# Patient Record
Sex: Male | Born: 1970
Health system: Southern US, Community
[De-identification: ages and names within clinical notes are randomized; demographics above are authoritative.]

## PROBLEM LIST (undated history)

## (undated) DIAGNOSIS — F329 Major depressive disorder, single episode, unspecified: Secondary | ICD-10-CM

## (undated) DIAGNOSIS — T7840XA Allergy, unspecified, initial encounter: Secondary | ICD-10-CM

## (undated) DIAGNOSIS — K219 Gastro-esophageal reflux disease without esophagitis: Secondary | ICD-10-CM

## (undated) DIAGNOSIS — R1310 Dysphagia, unspecified: Secondary | ICD-10-CM

## (undated) DIAGNOSIS — K589 Irritable bowel syndrome without diarrhea: Secondary | ICD-10-CM

## (undated) DIAGNOSIS — F32A Depression, unspecified: Secondary | ICD-10-CM

## (undated) DIAGNOSIS — K200 Eosinophilic esophagitis: Secondary | ICD-10-CM

## (undated) DIAGNOSIS — Z8619 Personal history of other infectious and parasitic diseases: Secondary | ICD-10-CM

## (undated) DIAGNOSIS — F419 Anxiety disorder, unspecified: Secondary | ICD-10-CM

## (undated) HISTORY — PX: OTHER SURGICAL HISTORY: SHX169

## (undated) HISTORY — PX: UPPER GASTROINTESTINAL ENDOSCOPY: SHX188

## (undated) HISTORY — DX: Depression, unspecified: F32.A

## (undated) HISTORY — DX: Irritable bowel syndrome, unspecified: K58.9

## (undated) HISTORY — DX: Dysphagia, unspecified: R13.10

## (undated) HISTORY — DX: Gastro-esophageal reflux disease without esophagitis: K21.9

## (undated) HISTORY — DX: Anxiety disorder, unspecified: F41.9

## (undated) HISTORY — DX: Personal history of other infectious and parasitic diseases: Z86.19

## (undated) HISTORY — PX: EYE SURGERY: SHX253

## (undated) HISTORY — DX: Eosinophilic esophagitis: K20.0

## (undated) HISTORY — DX: Allergy, unspecified, initial encounter: T78.40XA

---
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
## 1999-06-06 ENCOUNTER — Other Ambulatory Visit: Admission: RE | Admit: 1999-06-06 | Discharge: 1999-06-06 | Payer: Self-pay | Admitting: Internal Medicine

## 2002-09-29 ENCOUNTER — Inpatient Hospital Stay (HOSPITAL_COMMUNITY): Admission: EM | Admit: 2002-09-29 | Discharge: 2002-10-03 | Payer: Self-pay | Admitting: Internal Medicine

## 2002-10-14 ENCOUNTER — Encounter: Admission: RE | Admit: 2002-10-14 | Discharge: 2002-10-14 | Payer: Self-pay | Admitting: Internal Medicine

## 2002-12-22 ENCOUNTER — Encounter: Admission: RE | Admit: 2002-12-22 | Discharge: 2002-12-22 | Payer: Self-pay | Admitting: Internal Medicine

## 2010-12-11 ENCOUNTER — Encounter: Payer: Self-pay | Admitting: Internal Medicine

## 2010-12-11 ENCOUNTER — Ambulatory Visit (INDEPENDENT_AMBULATORY_CARE_PROVIDER_SITE_OTHER): Payer: 59 | Admitting: Internal Medicine

## 2010-12-11 DIAGNOSIS — R1310 Dysphagia, unspecified: Secondary | ICD-10-CM

## 2010-12-11 DIAGNOSIS — R079 Chest pain, unspecified: Secondary | ICD-10-CM

## 2010-12-11 NOTE — Progress Notes (Signed)
SEMISI Best [birth year] MRN [id number]        History of Present Illness:  This is a 40 year old white male with episode of severe substernal discomfort associated with eating an the evening meal consisting of chicken and pasta. According to him the food got stuck  for about 2 hours while he was trying to regurgitate it and belch. Small pieces of food came back but most of it eventually passed through.. Had a previous episode not as severe several years ago in [place], where his wife was doing a Financial risk analyst.. Barium swallow at that time did not show any abnormality such as stricture or dysmotility. although patient was not given a barium tablet .He reports occasional difficulty with solid food dysphagia and occasional heartburn. Since the episode 5 days ago he's had some odynophagia which has been improving . He denies dysphagia to liquids. Since the episode started he has been on ranitidine 75  milligrams daily, we have seeing him at approximately 10 years ago for symptoms associated with irritable bowel syndrome. He actually had an upper endoscopy at that time which was essentially normal, small bowel biopsies did not show evidence of  celiac disease  Past Medical History  Diagnosis Date  . Irritable bowel syndrome   . Elevated LFTs   . GERD (gastroesophageal reflux disease)   . History of shingles    History reviewed. No pertinent past surgical history.  reports that he has quit smoking. He has never used smokeless tobacco. He reports that he drinks alcohol. He reports that he does not use illicit drugs. family history includes Colon cancer in his paternal grandmother; Depression in his father; Diabetes in his mother; and Heart disease in his father. No Known Allergies      Review of Systems:  The remainder of the 10  point ROS is negative except as outlined in H&P   Physical Exam: General appearance  Well developed in no distress Eyes- non icteric HEENT nontraumatic,  normocephalic Mouth no lesions, tongue papillated, no cheilosis Neck supple without adenopathy, thyroid not enlarged, no carotid bruits, no JVD Lungs Clear to auscultation bilaterally Cor normal S1 normal S2, regular rhythm , no murmur,  quiet precordium Abdomen soft, voluntary guarding. Normoactive bowel sounds. Minimal discomfort in the subxiphoid area in the midline. Lower abdomen unremarkable Rectal: Deferred Extremities no pedal edema Skin no lesions Neurological alert and oriented x3 Psychological normal mood and  affect  Assessment and Plan:  Recurrent episodes of solid food dysphagia and chest discomfort suggestive of esophageal obstruction or esophageal spasm. Although his upper GI series several years ago was normal we need to rule out achalasia, esophageal ring or web or esophageal motility disorder. He does not have the  typical symptoms of gastroesophageal reflux. The symptoms seem to be intermittent which would go with achalasia. There has been no systemic changes of weight  loss or failure to thrive. Eosinophilic esophagitis is a possibility we will proceed with a diagnostic upper endoscopy and possible dilatation depending on the findings. Until then I gave him samples for Prilosec to take 20 mg daily. Esophageal manometry or repeat barium esophagram is an option   12/11/2010 Lina Sar

## 2010-12-11 NOTE — Patient Instructions (Addendum)
You have been scheduled for an endoscopy. Please follow written instructions given to you at your visit today. Continue Prilosec samples 20 mg by mouth once daily. CC: Dr Albertine Patricia, Urgent Care, [address]

## 2010-12-16 ENCOUNTER — Encounter: Payer: Self-pay | Admitting: Internal Medicine

## 2011-01-09 ENCOUNTER — Ambulatory Visit (AMBULATORY_SURGERY_CENTER): Payer: 59 | Admitting: Internal Medicine

## 2011-01-09 ENCOUNTER — Encounter: Payer: Self-pay | Admitting: Internal Medicine

## 2011-01-09 VITALS — BP 121/81 | HR 61 | Temp 97.9°F | Resp 20 | Ht 71.0 in | Wt 178.0 lb

## 2011-01-09 DIAGNOSIS — K200 Eosinophilic esophagitis: Secondary | ICD-10-CM

## 2011-01-09 DIAGNOSIS — R1310 Dysphagia, unspecified: Secondary | ICD-10-CM

## 2011-01-09 DIAGNOSIS — R933 Abnormal findings on diagnostic imaging of other parts of digestive tract: Secondary | ICD-10-CM

## 2011-01-09 DIAGNOSIS — K222 Esophageal obstruction: Secondary | ICD-10-CM

## 2011-01-09 MED ORDER — SODIUM CHLORIDE 0.9 % IV SOLN: 500.0000 mL | INTRAVENOUS | Status: DC

## 2011-01-09 MED ORDER — ESOMEPRAZOLE MAGNESIUM 40 MG PO CPDR: 40.0000 mg | DELAYED_RELEASE_CAPSULE | Freq: Every day | ORAL | Status: DC

## 2011-01-09 NOTE — Patient Instructions (Signed)
FOLLOW DISCHARGE INSTRUCTIONS (BLUE & GREEN SHEETS).  FOLLOW DILATION OF ESOPHAGUS DIET GIVEN TO YOU . LIQUIDS UNTIL 10 AM , THEN SOFT FOODS THE REST OF TODAY.

## 2011-01-10 ENCOUNTER — Telehealth: Payer: Self-pay | Admitting: *Deleted

## 2011-01-10 NOTE — Telephone Encounter (Signed)

## 2011-01-15 ENCOUNTER — Encounter: Payer: Self-pay | Admitting: Internal Medicine

## 2011-04-11 ENCOUNTER — Encounter (INDEPENDENT_AMBULATORY_CARE_PROVIDER_SITE_OTHER): Payer: 59 | Admitting: Family Medicine

## 2011-04-11 DIAGNOSIS — R5383 Other fatigue: Secondary | ICD-10-CM

## 2011-04-11 DIAGNOSIS — Z0000 Encounter for general adult medical examination without abnormal findings: Secondary | ICD-10-CM

## 2011-04-11 DIAGNOSIS — R5381 Other malaise: Secondary | ICD-10-CM

## 2011-11-06 ENCOUNTER — Encounter: Payer: Self-pay | Admitting: Internal Medicine

## 2011-11-06 ENCOUNTER — Ambulatory Visit (INDEPENDENT_AMBULATORY_CARE_PROVIDER_SITE_OTHER): Payer: 59 | Admitting: Internal Medicine

## 2011-11-06 VITALS — BP 115/70 | HR 64 | Temp 97.6°F | Resp 16 | Ht 71.0 in | Wt 175.0 lb

## 2011-11-06 DIAGNOSIS — T700XXA Otitic barotrauma, initial encounter: Secondary | ICD-10-CM

## 2011-11-06 DIAGNOSIS — H9209 Otalgia, unspecified ear: Secondary | ICD-10-CM

## 2011-11-06 NOTE — Progress Notes (Signed)
  Subjective:    Patient ID: Darren Best, male    DOB: [birth year], 41 y.o.   MRN: [id number]  HPI Scuba diving, felt pressure right ear, never fully returned to normal. Now low grade pressure feeling, no hearing loss. No uri, ha, fever.   Review of Systems     Objective:   Physical Exam Right TM appears normal but does not move withpopping, left TM does move . No reddness, no wax       Assessment & Plan:  Pop ears, chew gum and give 1 week Refer to ent if not well

## 2012-04-26 ENCOUNTER — Encounter: Payer: Self-pay | Admitting: Family Medicine

## 2012-04-26 ENCOUNTER — Ambulatory Visit (INDEPENDENT_AMBULATORY_CARE_PROVIDER_SITE_OTHER): Payer: 59 | Admitting: Family Medicine

## 2012-04-26 VITALS — BP 118/80 | HR 60 | Temp 98.0°F | Resp 16 | Ht 71.0 in | Wt 181.2 lb

## 2012-04-26 DIAGNOSIS — Z23 Encounter for immunization: Secondary | ICD-10-CM

## 2012-04-26 DIAGNOSIS — G44209 Tension-type headache, unspecified, not intractable: Secondary | ICD-10-CM

## 2012-04-26 DIAGNOSIS — R079 Chest pain, unspecified: Secondary | ICD-10-CM

## 2012-04-26 DIAGNOSIS — F4541 Pain disorder exclusively related to psychological factors: Secondary | ICD-10-CM

## 2012-04-26 DIAGNOSIS — K589 Irritable bowel syndrome without diarrhea: Secondary | ICD-10-CM

## 2012-04-26 DIAGNOSIS — Z0000 Encounter for general adult medical examination without abnormal findings: Secondary | ICD-10-CM

## 2012-04-26 LAB — CBC WITH DIFFERENTIAL/PLATELET
Basophils Absolute: 0.1 K/uL (ref 0.0–0.1)
Basophils Relative: 1 % (ref 0–1)
Eosinophils Absolute: 0.5 K/uL (ref 0.0–0.7)
Eosinophils Relative: 10 % — ABNORMAL HIGH (ref 0–5)
HCT: 41.9 % (ref 39.0–52.0)
Hemoglobin: 14.7 g/dL (ref 13.0–17.0)
Lymphocytes Relative: 34 % (ref 12–46)
Lymphs Abs: 1.8 K/uL (ref 0.7–4.0)
MCH: 30.4 pg (ref 26.0–34.0)
MCHC: 35.1 g/dL (ref 30.0–36.0)
MCV: 86.6 fL (ref 78.0–100.0)
Monocytes Absolute: 0.4 K/uL (ref 0.1–1.0)
Monocytes Relative: 8 % (ref 3–12)
Neutro Abs: 2.6 K/uL (ref 1.7–7.7)
Neutrophils Relative %: 47 % (ref 43–77)
Platelets: 279 K/uL (ref 150–400)
RBC: 4.84 MIL/uL (ref 4.22–5.81)
RDW: 13.8 % (ref 11.5–15.5)
WBC: 5.4 K/uL (ref 4.0–10.5)

## 2012-04-26 LAB — COMPREHENSIVE METABOLIC PANEL WITH GFR
ALT: 14 U/L (ref 0–53)
AST: 19 U/L (ref 0–37)
Albumin: 4.5 g/dL (ref 3.5–5.2)
Alkaline Phosphatase: 53 U/L (ref 39–117)
BUN: 10 mg/dL (ref 6–23)
CO2: 28 meq/L (ref 19–32)
Calcium: 9.6 mg/dL (ref 8.4–10.5)
Chloride: 104 meq/L (ref 96–112)
Creat: 0.83 mg/dL (ref 0.50–1.35)
Glucose, Bld: 93 mg/dL (ref 70–99)
Potassium: 4.3 meq/L (ref 3.5–5.3)
Sodium: 140 meq/L (ref 135–145)
Total Bilirubin: 0.6 mg/dL (ref 0.3–1.2)
Total Protein: 7.3 g/dL (ref 6.0–8.3)

## 2012-04-26 LAB — LIPID PANEL
Cholesterol: 222 mg/dL — ABNORMAL HIGH (ref 0–200)
HDL: 62 mg/dL
LDL Cholesterol: 149 mg/dL — ABNORMAL HIGH (ref 0–99)
Total CHOL/HDL Ratio: 3.6 ratio
Triglycerides: 53 mg/dL
VLDL: 11 mg/dL (ref 0–40)

## 2012-04-26 MED ORDER — HYOSCYAMINE SULFATE 0.125 MG PO TABS: 0.1250 mg | ORAL_TABLET | ORAL | Status: DC | PRN

## 2012-04-26 NOTE — Progress Notes (Signed)
Subjective:    Patient ID: Darren Best, male    DOB: 05/04/1970, 42 y.o.   MRN: [id number]  HPI Darren Best is a 42 y.o. male Here for CPE.  Last CPE - 04/2011. LDL elevated then at 143- recommended 6 months follow up.   Chest pains - few times over the holidays - no nausea, no dizziness, not sweating at the time.  Non exertional, came out of the blue. No chest pain with exercise.  Would last for about a day - center chest - "after pain of feeling like somebody hit him". Hx of GERD in past, followed by GI - taking Nexium, but not every day - taking about 4 -5 days per week. Hx of endoscopy with "scarring" and had to have widening of esophagus in 01/2011.  Some increased stress with work, but not particularly anxious at times of pain. No radiation of pain except upper back once.  dull ache. Thought may be sleep posture. No personal hx of heart disease. Tried low dose aspirin x 4 - pain relieved in 1 day. headache at times - stress headaches at times. Occipital at times. Less exercise recently. 2-3 days per week.   Rare - less than once per month IBS sx's - treated with hyoscamine in past - ran out.   Unknown last tetanus.  Last flu vaccine in November.   FH: father with stent recently - in 68's.  No early CAD/MI in family.  Mom and brother with DM2 Quit smoking 8-10 years ago. approx 5 pack year hx or less.    Review of Systems  Respiratory: Negative for cough, chest tightness and shortness of breath.   Cardiovascular: Positive for chest pain.  Neurological: Positive for headaches.  All other systems reviewed and are negative.       Objective:   Physical Exam  Vitals reviewed. Constitutional: He is oriented to person, place, and time. He appears well-developed and well-nourished.  HENT:  Head: Normocephalic and atraumatic.  Right Ear: External ear normal.  Left Ear: External ear normal.  Mouth/Throat: Oropharynx is clear and moist.  Eyes: Conjunctivae normal and EOM are  normal. Pupils are equal, round, and reactive to light.  Neck: Normal range of motion. Neck supple. No thyromegaly present.  Cardiovascular: Normal rate, regular rhythm, normal heart sounds and intact distal pulses.   Pulmonary/Chest: Effort normal and breath sounds normal. No respiratory distress. He has no wheezes.  Abdominal: Soft. He exhibits no distension. There is no tenderness. Hernia confirmed negative in the right inguinal area and confirmed negative in the left inguinal area.  Musculoskeletal: Normal range of motion. He exhibits no edema and no tenderness.  Lymphadenopathy:    He has no cervical adenopathy.  Neurological: He is alert and oriented to person, place, and time. He has normal reflexes.  Skin: Skin is warm and dry.  Psychiatric: He has a normal mood and affect. His behavior is normal.    EKG: Sinus rhythm, rate 55, nonspecific t in V2 only.       Assessment & Plan:  Darren Best is a 42 y.o. male 1. Routine general medical examination at a health care facility  CBC with Differential, Comprehensive metabolic panel, TSH, EKG 12-Lead. Labs drawn, anticipatory guidance as below. Declined STI testing.  tdap updated.  utd on flu vaccination.   2. Chest pain  Comprehensive metabolic panel, Lipid panel, EKG 12-Lead. Non exertional and no pain with exercise reassuring.  Hx of GERD, with occasional missed doses  of PPI. Only cad risk factor - prior hyperlipidemia.  PPI QD, and if pain persists - consider stress testing/cards eval or recheck with GI.   3. Stress headache  Tylenol, relaxation and stress mgt techniques. Recheck if persistent or worsening.   4. IBS (irritable bowel syndrome)  hyoscyamine (LEVSIN, ANASPAZ) 0.125 MG tablet refilled as rare flair, but if refills neede, may need to discuss with GI.   5. Need for prophylactic vaccination with combined diphtheria-tetanus-pertussis (DTP) vaccine  Tdap vaccine greater than or equal to 7yo IM    Patient Instructions  Try to  take your acid blocker every day.  If chest pains recur - would recommend stress testing or cardiology evaluation, and possibly may be something to discuss with your gastroenterologist. Return to the clinic or go to the nearest emergency room if any of your symptoms worsen or new symptoms occur.  Your should receive a call or letter about your lab results within the next week to 10 days.  If cholesterol is elevated, we can discuss diet and medication options. To look up more info on your condition, go to the website [email or web address], then on patient resources - select UPTODATE. Under patient resources, select cholesterol or hyperlipidemia.  Keeping you healthy  Get these tests  Blood pressure- Have your blood pressure checked once a year by your healthcare provider.  Normal blood pressure is 120/80.  Weight- Have your body mass index (BMI) calculated to screen for obesity.  BMI is a measure of body fat based on height and weight. You can also calculate your own BMI at https://www.west-esparza.com/.  Cholesterol- Have your cholesterol checked regularly starting at age 20, sooner may be necessary if you have diabetes, high blood pressure, if a family member developed heart diseases at an early age or if you smoke.   Chlamydia, HIV, and other sexual transmitted disease- Get screened each year until the age of 15 then within three months of each new sexual partner.  Diabetes- Have your blood sugar checked regularly if you have high blood pressure, high cholesterol, a family history of diabetes or if you are overweight.  Get these vaccines  Flu shot- Every fall.  Tetanus shot- Every 10 years.  Menactra- Single dose; prevents meningitis.  Take these steps  Don't smoke- If you do smoke, ask your healthcare provider about quitting. For tips on how to quit, go to www.smokefree.gov or call 1-800-QUIT-NOW.  Be physically active- Exercise 5 days a week for at least 30 minutes.  If you are not already  physically active start slow and gradually work up to 30 minutes of moderate physical activity.  Examples of moderate activity include walking briskly, mowing the yard, dancing, swimming bicycling, etc.  Eat a healthy diet- Eat a variety of healthy foods such as fruits, vegetables, low fat milk, low fat cheese, yogurt, lean meats, poultry, fish, beans, tofu, etc.  For more information on healthy eating, go to www.thenutritionsource.org  Drink alcohol in moderation- Limit alcohol intake two drinks or less a day.  Never drink and drive.  Dentist- Brush and floss teeth twice daily; visit your dentis twice a year.  Depression-Your emotional health is as important as your physical health.  If you're feeling down, losing interest in things you normally enjoy please talk with your healthcare provider.  Gun Safety- If you keep a gun in your home, keep it unloaded and with the safety lock on.  Bullets should be stored separately.  Helmet use- Always wear a helmet when riding  a motorcycle, bicycle, rollerblading or skateboarding.  Safe sex- If you may be exposed to a sexually transmitted infection, use a condom  Seat belts- Seat bels can save your life; always wear one.  Smoke/Carbon Monoxide detectors- These detectors need to be installed on the appropriate level of your home.  Replace batteries at least once a year.  Skin Cancer- When out in the sun, cover up and use sunscreen SPF 15 or higher.  Violence- If anyone is threatening or hurting you, please tell your healthcare provider.

## 2012-04-26 NOTE — Progress Notes (Signed)
  Subjective:    Patient ID: Darren Best, male    DOB: [birth year], 42 y.o.   MRN: [id number]  HPI    Review of Systems  Constitutional: Negative.   Eyes: Negative.   Respiratory: Negative.   Cardiovascular: Positive for chest pain.       Mild- not currently  Gastrointestinal: Negative.   Genitourinary: Negative.   Musculoskeletal: Negative.   Skin: Negative.   Neurological: Positive for headaches.  Hematological: Negative.   Psychiatric/Behavioral: Negative.        Objective:   Physical Exam        Assessment & Plan:

## 2012-04-26 NOTE — Patient Instructions (Addendum)
Try to take your acid blocker every day.  If chest pains recur - would recommend stress testing or cardiology evaluation, and possibly may be something to discuss with your gastroenterologist. Return to the clinic or go to the nearest emergency room if any of your symptoms worsen or new symptoms occur.  Your should receive a call or letter about your lab results within the next week to 10 days.  If cholesterol is elevated, we can discuss diet and medication options. To look up more info on your condition, go to the website [email or web address], then on patient resources - select UPTODATE. Under patient resources, select cholesterol or hyperlipidemia.  Keeping you healthy  Get these tests  Blood pressure- Have your blood pressure checked once a year by your healthcare provider.  Normal blood pressure is 120/80.  Weight- Have your body mass index (BMI) calculated to screen for obesity.  BMI is a measure of body fat based on height and weight. You can also calculate your own BMI at https://www.west-esparza.com/.  Cholesterol- Have your cholesterol checked regularly starting at age 13, sooner may be necessary if you have diabetes, high blood pressure, if a family member developed heart diseases at an early age or if you smoke.   Chlamydia, HIV, and other sexual transmitted disease- Get screened each year until the age of 40 then within three months of each new sexual partner.  Diabetes- Have your blood sugar checked regularly if you have high blood pressure, high cholesterol, a family history of diabetes or if you are overweight.  Get these vaccines  Flu shot- Every fall.  Tetanus shot- Every 10 years.  Menactra- Single dose; prevents meningitis.  Take these steps  Don't smoke- If you do smoke, ask your healthcare provider about quitting. For tips on how to quit, go to www.smokefree.gov or call 1-800-QUIT-NOW.  Be physically active- Exercise 5 days a week for at least 30 minutes.  If you are not  already physically active start slow and gradually work up to 30 minutes of moderate physical activity.  Examples of moderate activity include walking briskly, mowing the yard, dancing, swimming bicycling, etc.  Eat a healthy diet- Eat a variety of healthy foods such as fruits, vegetables, low fat milk, low fat cheese, yogurt, lean meats, poultry, fish, beans, tofu, etc.  For more information on healthy eating, go to www.thenutritionsource.org  Drink alcohol in moderation- Limit alcohol intake two drinks or less a day.  Never drink and drive.  Dentist- Brush and floss teeth twice daily; visit your dentis twice a year.  Depression-Your emotional health is as important as your physical health.  If you're feeling down, losing interest in things you normally enjoy please talk with your healthcare provider.  Gun Safety- If you keep a gun in your home, keep it unloaded and with the safety lock on.  Bullets should be stored separately.  Helmet use- Always wear a helmet when riding a motorcycle, bicycle, rollerblading or skateboarding.  Safe sex- If you may be exposed to a sexually transmitted infection, use a condom  Seat belts- Seat bels can save your life; always wear one.  Smoke/Carbon Monoxide detectors- These detectors need to be installed on the appropriate level of your home.  Replace batteries at least once a year.  Skin Cancer- When out in the sun, cover up and use sunscreen SPF 15 or higher.  Violence- If anyone is threatening or hurting you, please tell your healthcare provider.

## 2012-04-27 ENCOUNTER — Encounter: Payer: Self-pay | Admitting: *Deleted

## 2012-05-04 ENCOUNTER — Telehealth: Payer: Self-pay

## 2012-05-04 ENCOUNTER — Encounter: Payer: Self-pay | Admitting: Family Medicine

## 2012-05-04 NOTE — Telephone Encounter (Signed)
Patient calling back asking for clinical tl or lab tech for results. He received a voicemail and wants his results. Thank you! Best: [phone]

## 2012-05-04 NOTE — Telephone Encounter (Signed)
They have been released to my chart now.

## 2012-05-05 NOTE — Telephone Encounter (Signed)
I spoke to patient and advised of lab results.

## 2012-05-22 ENCOUNTER — Other Ambulatory Visit: Payer: Self-pay

## 2012-06-10 ENCOUNTER — Encounter: Payer: Self-pay | Admitting: Family Medicine

## 2012-06-13 ENCOUNTER — Other Ambulatory Visit: Payer: Self-pay | Admitting: Family Medicine

## 2012-06-13 DIAGNOSIS — J309 Allergic rhinitis, unspecified: Secondary | ICD-10-CM

## 2012-06-13 MED ORDER — FEXOFENADINE HCL 180 MG PO TABS: 180.0000 mg | ORAL_TABLET | Freq: Every day | ORAL | Status: DC

## 2012-06-25 ENCOUNTER — Other Ambulatory Visit: Payer: Self-pay | Admitting: Physician Assistant

## 2012-06-25 ENCOUNTER — Other Ambulatory Visit: Payer: Self-pay | Admitting: Radiology

## 2012-06-25 ENCOUNTER — Encounter: Payer: Self-pay | Admitting: Family Medicine

## 2012-06-25 MED ORDER — ESOMEPRAZOLE MAGNESIUM 40 MG PO CPDR: 40.0000 mg | DELAYED_RELEASE_CAPSULE | Freq: Every day | ORAL | Status: DC

## 2012-06-25 NOTE — Telephone Encounter (Signed)
Please advise 

## 2012-06-29 ENCOUNTER — Other Ambulatory Visit: Payer: Self-pay | Admitting: Radiology

## 2012-06-29 MED ORDER — PANTOPRAZOLE SODIUM 40 MG PO TBEC: 40.0000 mg | DELAYED_RELEASE_TABLET | Freq: Every day | ORAL | Status: DC

## 2012-06-29 NOTE — Telephone Encounter (Signed)
Received fax from pharmacy regarding patients Nexium Protonix is recommended there will be no copay with this. Please advise if okay to change from Nexium to Protonix. Pended protonix.

## 2012-07-26 ENCOUNTER — Encounter: Payer: Self-pay | Admitting: Family Medicine

## 2012-08-27 ENCOUNTER — Encounter: Payer: Self-pay | Admitting: Family Medicine

## 2012-08-27 NOTE — Telephone Encounter (Signed)
Please advise, patient should come in for eval/ discussion of meds, correct?

## 2012-08-27 NOTE — Telephone Encounter (Signed)
Sent via My Chart:  Darren Best,  We recommend that you come in for evaluation of this.  None of the OTC products for nail fungus work very well.  Warmly, Nicola Quesnell

## 2012-12-21 ENCOUNTER — Encounter: Payer: Self-pay | Admitting: Internal Medicine

## 2012-12-22 ENCOUNTER — Telehealth: Payer: Self-pay | Admitting: Family Medicine

## 2012-12-22 DIAGNOSIS — K589 Irritable bowel syndrome without diarrhea: Secondary | ICD-10-CM

## 2012-12-22 MED ORDER — HYOSCYAMINE SULFATE 0.125 MG PO TABS: 0.1250 mg | ORAL_TABLET | ORAL | Status: DC | PRN

## 2012-12-22 NOTE — Telephone Encounter (Signed)
I have sent #[phone] as pt is traveling, but for further refills will need to RTC or discuss with GI

## 2013-02-07 ENCOUNTER — Encounter: Payer: Self-pay | Admitting: Family Medicine

## 2013-02-08 NOTE — Telephone Encounter (Signed)
error 

## 2013-02-10 ENCOUNTER — Other Ambulatory Visit: Payer: Self-pay

## 2013-02-16 ENCOUNTER — Ambulatory Visit (INDEPENDENT_AMBULATORY_CARE_PROVIDER_SITE_OTHER): Payer: 59 | Admitting: Emergency Medicine

## 2013-02-16 VITALS — BP 110/78 | HR 54 | Temp 98.0°F | Resp 16 | Ht 71.0 in | Wt 179.0 lb

## 2013-02-16 DIAGNOSIS — H02826 Cysts of left eye, unspecified eyelid: Secondary | ICD-10-CM

## 2013-02-16 DIAGNOSIS — H02829 Cysts of unspecified eye, unspecified eyelid: Secondary | ICD-10-CM

## 2013-02-16 NOTE — Progress Notes (Signed)
[facility] [address], [place] [address] [phone]  Date:  02/16/2013   Name:  Darren Best   DOB:  [birth year]   MRN:  [id number]  PCP:  Shade Flood, MD    Chief Complaint: Eye Problem   History of Present Illness:  Darren Best is a 42 y.o. very pleasant male patient who presents with the following:  3 week history of enlarging cystic mass on the left upper lid.  Initially inflamed and now enlarging but no longer erythematous.  Not painful or tender.  Tends to be noticeable in his vision due deformity of the upper lid.  No improvement with over the counter medications or other home remedies. Denies other complaint or health concern today.   There are no active problems to display for this patient.   Past Medical History  Diagnosis Date  . Irritable bowel syndrome   . Elevated LFTs   . GERD (gastroesophageal reflux disease)   . History of shingles     History reviewed. No pertinent past surgical history.  History  Substance Use Topics  . Smoking status: Former Games developer  . Smokeless tobacco: Never Used  . Alcohol Use: Yes     Comment: 2 drinks daily - 2 beers max    Family History  Problem Relation Age of Onset  . Depression Father   . Heart disease Father     some coronary issues  . Colon cancer Paternal Grandmother     ?  . Diabetes Mother     adult onset  . Diabetes Brother     No Known Allergies  Medication list has been reviewed and updated.  Current Outpatient Prescriptions on File Prior to Visit  Medication Sig Dispense Refill  . esomeprazole (NEXIUM) 40 MG capsule Take 1 capsule (40 mg total) by mouth daily.  90 capsule  3  . fexofenadine (ALLEGRA) 180 MG tablet Take 1 tablet (180 mg total) by mouth daily.  90 tablet  1  . hyoscyamine (LEVSIN, ANASPAZ) 0.125 MG tablet Take 1 tablet (0.125 mg total) by mouth every 4 (four) hours as needed for cramping.  30 tablet  0  . omeprazole (PRILOSEC OTC) 20 MG tablet Take  20 mg by mouth daily.        . pantoprazole (PROTONIX) 40 MG tablet Take 1 tablet (40 mg total) by mouth daily.  30 tablet  3   No current facility-administered medications on file prior to visit.    Review of Systems:  As per HPI, otherwise negative.    Physical Examination: Filed Vitals:   02/16/13 0836  BP: 110/78  Pulse: 54  Temp: 98 F (36.7 C)  Resp: 16   Filed Vitals:   02/16/13 0836  Height: 5\' 11"  (1.803 m)  Weight: 179 lb (81.194 kg)   Body mass index is 24.98 kg/(m^2). Ideal Body Weight: Weight in (lb) to have BMI = 25: 178.9   GEN: WDWN, NAD, Non-toxic, Alert & Oriented x 3 HEENT: Atraumatic, Normocephalic.  Ears and Nose: No external deformity. EXTR: No clubbing/cyanosis/edema NEURO: Normal gait.  PSYCH: Normally interactive. Conversant. Not depressed or anxious appearing.  Calm demeanor.  LEFT upper lid:  1 cm diameter soft mas.  PRRERLA EOMI.  Assessment and Plan: Inclusion cyst Plastics referral   Signed,  Phillips Odor, MD

## 2013-03-30 DIAGNOSIS — H02829 Cysts of unspecified eye, unspecified eyelid: Secondary | ICD-10-CM | POA: Insufficient documentation

## 2013-04-17 ENCOUNTER — Emergency Department (HOSPITAL_COMMUNITY)
Admission: EM | Admit: 2013-04-17 | Discharge: 2013-04-17 | Disposition: A | Discharge status: Home / Self Care | Payer: 59 | Source: Home / Self Care

## 2013-04-17 ENCOUNTER — Encounter (HOSPITAL_COMMUNITY): Payer: Self-pay | Admitting: Emergency Medicine

## 2013-04-17 DIAGNOSIS — H02829 Cysts of unspecified eye, unspecified eyelid: Secondary | ICD-10-CM

## 2013-04-17 DIAGNOSIS — H02826 Cysts of left eye, unspecified eyelid: Secondary | ICD-10-CM

## 2013-04-17 MED ORDER — SULFAMETHOXAZOLE-TRIMETHOPRIM 800-160 MG PO TABS: 1.0000 | ORAL_TABLET | Freq: Two times a day (BID) | ORAL | Status: DC

## 2013-04-17 NOTE — ED Provider Notes (Signed)
CSN: [id number]     Arrival date & time 04/17/13  4332 History   None    Chief Complaint  Patient presents with  . Cyst   (Consider location/radiation/quality/duration/timing/severity/associated sxs/prior Treatment) HPI Comments: 43 year old male presents complaining of mildly tender, inflamed and possibly infected cyst over his left eye. It has been there since about November and has just started getting worse in the past couple of weeks. He has an appointment with plastic surgery to get this removed 2 weeks from now. His wife is a physician, she was out of town and after seeing a picture of the eye she recommended he come in and get on some antibiotics. Denies any systemic symptoms or drainage. Denies any eye pain with extraocular movements.   Past Medical History  Diagnosis Date  . Irritable bowel syndrome   . Elevated LFTs   . GERD (gastroesophageal reflux disease)   . History of shingles    History reviewed. No pertinent past surgical history. Family History  Problem Relation Age of Onset  . Depression Father   . Heart disease Father     some coronary issues  . Colon cancer Paternal Grandmother     ?  . Diabetes Mother     adult onset  . Diabetes Brother    History  Substance Use Topics  . Smoking status: Former Research scientist (life sciences)  . Smokeless tobacco: Never Used  . Alcohol Use: Yes     Comment: 2 drinks daily - 2 beers max    Review of Systems  Constitutional: Negative for fever, chills and fatigue.  HENT: Negative for sore throat.   Eyes: Negative for visual disturbance.       See history of present illness  Respiratory: Negative for cough and shortness of breath.   Cardiovascular: Negative for chest pain, palpitations and leg swelling.  Gastrointestinal: Negative for nausea, vomiting, abdominal pain, diarrhea and constipation.  Genitourinary: Negative for dysuria, urgency, frequency and hematuria.  Musculoskeletal: Negative for arthralgias, myalgias, neck pain and neck  stiffness.  Skin: Negative for rash.  Neurological: Negative for dizziness, weakness and light-headedness.    Allergies  Review of patient's allergies indicates no known allergies.  Home Medications   Current Outpatient Rx  Name  Route  Sig  Dispense  Refill  . esomeprazole (NEXIUM) 40 MG capsule   Oral   Take 1 capsule (40 mg total) by mouth daily.   90 capsule   3   . fexofenadine (ALLEGRA) 180 MG tablet   Oral   Take 1 tablet (180 mg total) by mouth daily.   90 tablet   1   . hyoscyamine (LEVSIN, ANASPAZ) 0.125 MG tablet   Oral   Take 1 tablet (0.125 mg total) by mouth every 4 (four) hours as needed for cramping.   30 tablet   0   . omeprazole (PRILOSEC OTC) 20 MG tablet   Oral   Take 20 mg by mouth daily.           . pantoprazole (PROTONIX) 40 MG tablet   Oral   Take 1 tablet (40 mg total) by mouth daily.   30 tablet   3   . sulfamethoxazole-trimethoprim (SEPTRA DS) 800-160 MG per tablet   Oral   Take 1 tablet by mouth every 12 (twelve) hours.   20 tablet   0    BP 124/75  Pulse 70  Temp(Src) 97.8 F (36.6 C) (Oral)  Resp 18  SpO2 100% Physical Exam  Nursing note and vitals  reviewed. Constitutional: He is oriented to person, place, and time. He appears well-developed and well-nourished. No distress.  HENT:  Head: Normocephalic.  Eyes:    Pulmonary/Chest: Effort normal. No respiratory distress.  Neurological: He is alert and oriented to person, place, and time. Coordination normal.  Skin: Skin is warm and dry. No rash noted. He is not diaphoretic.  Psychiatric: He has a normal mood and affect. Judgment normal.    ED Course  Procedures (including critical care time) Labs Review Labs Reviewed - No data to display Imaging Review No results found.    MDM   1. Cyst, eyelid, left    Treated with Bactrim, advised NSAIDs for symptomatic relief. Follow up with the plastic surgeon as instructed.  Meds ordered this encounter  Medications    . sulfamethoxazole-trimethoprim (SEPTRA DS) 800-160 MG per tablet    Sig: Take 1 tablet by mouth every 12 (twelve) hours.    Dispense:  20 tablet    Refill:  0    Order Specific Question:  Supervising Provider    Answer:  Ihor Gully D Burnside, PA-C 04/17/13 [phone]

## 2013-04-17 NOTE — Discharge Instructions (Signed)

## 2013-04-17 NOTE — ED Notes (Signed)
C/o cyst on left eyebrow which has been there for months Past days the cyst has been inflammed and presses down on eye Surgery is scheduled on Jan. 26 Monday. Denies drainage

## 2013-04-18 NOTE — ED Provider Notes (Signed)
Medical screening examination/treatment/procedure(s) were performed by resident physician or non-physician practitioner and as supervising physician I was immediately available for consultation/collaboration.   Pauline Good MD.   Billy Fischer, MD 04/18/13 1755

## 2013-10-10 ENCOUNTER — Encounter: Payer: Self-pay | Admitting: Family Medicine

## 2013-10-10 ENCOUNTER — Ambulatory Visit (INDEPENDENT_AMBULATORY_CARE_PROVIDER_SITE_OTHER): Payer: 59 | Admitting: Family Medicine

## 2013-10-10 VITALS — BP 127/81 | HR 66 | Temp 98.6°F | Resp 16 | Ht 71.25 in | Wt 177.8 lb

## 2013-10-10 DIAGNOSIS — R079 Chest pain, unspecified: Secondary | ICD-10-CM

## 2013-10-10 DIAGNOSIS — R12 Heartburn: Secondary | ICD-10-CM

## 2013-10-10 DIAGNOSIS — Z0000 Encounter for general adult medical examination without abnormal findings: Secondary | ICD-10-CM

## 2013-10-10 DIAGNOSIS — K589 Irritable bowel syndrome without diarrhea: Secondary | ICD-10-CM

## 2013-10-10 LAB — CBC WITH DIFFERENTIAL/PLATELET
Basophils Absolute: 0 10*3/uL (ref 0.0–0.1)
Basophils Relative: 1 % (ref 0–1)
EOS ABS: 0.5 10*3/uL (ref 0.0–0.7)
EOS PCT: 11 % — AB (ref 0–5)
HEMATOCRIT: 42.2 % (ref 39.0–52.0)
HEMOGLOBIN: 15.1 g/dL (ref 13.0–17.0)
LYMPHS ABS: 1.6 10*3/uL (ref 0.7–4.0)
LYMPHS PCT: 35 % (ref 12–46)
MCH: 30.9 pg (ref 26.0–34.0)
MCHC: 35.8 g/dL (ref 30.0–36.0)
MCV: 86.3 fL (ref 78.0–100.0)
MONO ABS: 0.3 10*3/uL (ref 0.1–1.0)
MONOS PCT: 6 % (ref 3–12)
Neutro Abs: 2.2 10*3/uL (ref 1.7–7.7)
Neutrophils Relative %: 47 % (ref 43–77)
Platelets: 241 10*3/uL (ref 150–400)
RBC: 4.89 MIL/uL (ref 4.22–5.81)
RDW: 13.5 % (ref 11.5–15.5)
WBC: 4.6 10*3/uL (ref 4.0–10.5)

## 2013-10-10 MED ORDER — HYOSCYAMINE SULFATE 0.125 MG PO TABS: 0.1250 mg | ORAL_TABLET | ORAL | Status: DC | PRN

## 2013-10-10 NOTE — Patient Instructions (Addendum)
Try to avoid common foods that can contribute to heartburn below, and can use zantac twice per day over the counter as needed. If chest pains still persist- recheck in next month or two to look at other causes. Return to the clinic or go to the nearest emergency room if any of your symptoms worsen or new symptoms occur. You should receive a call or letter about your lab results within the next week to 10 days.   Food Choices for Gastroesophageal Reflux Disease When you have gastroesophageal reflux disease (GERD), the foods you eat and your eating habits are very important. Choosing the right foods can help ease the discomfort of GERD. WHAT GENERAL GUIDELINES DO I NEED TO FOLLOW?  Choose fruits, vegetables, whole grains, low-fat dairy products, and low-fat meat, fish, and poultry.  Limit fats such as oils, salad dressings, butter, nuts, and avocado.  Keep a food diary to identify foods that cause symptoms.  Avoid foods that cause reflux. These may be different for different people.  Eat frequent small meals instead of three large meals each day.  Eat your meals slowly, in a relaxed setting.  Limit fried foods.  Cook foods using methods other than frying.  Avoid drinking alcohol.  Avoid drinking large amounts of liquids with your meals.  Avoid bending over or lying down until 2-3 hours after eating. WHAT FOODS ARE NOT RECOMMENDED? The following are some foods and drinks that may worsen your symptoms: Vegetables Tomatoes. Tomato juice. Tomato and spaghetti sauce. Chili peppers. Onion and garlic. Horseradish. Fruits Oranges, grapefruit, and lemon (fruit and juice). Meats High-fat meats, fish, and poultry. This includes hot dogs, ribs, ham, sausage, salami, and bacon. Dairy Whole milk and chocolate milk. Sour cream. Cream. Butter. Ice cream. Cream cheese.  Beverages Coffee and tea, with or without caffeine. Carbonated beverages or energy drinks. Condiments Hot sauce. Barbecue  sauce.  Sweets/Desserts Chocolate and cocoa. Donuts. Peppermint and spearmint. Fats and Oils High-fat foods, including [place] fries and potato chips. Other Vinegar. Strong spices, such as black pepper, white pepper, red pepper, cayenne, curry powder, cloves, ginger, and chili powder. The items listed above may not be a complete list of foods and beverages to avoid. Contact your dietitian for more information. Document Released: 03/24/2005 Document Revised: 03/29/2013 Document Reviewed: 01/26/2013 [facility] Patient Information 2015 [place], [place]. This information is not intended to replace advice given to you by your health care provider. Make sure you discuss any questions you have with your health care provider.   Keeping you healthy  Get these tests  Blood pressure- Have your blood pressure checked once a year by your healthcare provider.  Normal blood pressure is 120/80.  Weight- Have your body mass index (BMI) calculated to screen for obesity.  BMI is a measure of body fat based on height and weight. You can also calculate your own BMI at [employer]velBags.it.  Cholesterol- Have your cholesterol checked regularly starting at age 28, sooner may be necessary if you have diabetes, high blood pressure, if a family member developed heart diseases at an early age or if you smoke.   Chlamydia, HIV, and other sexual transmitted disease- Get screened each year until the age of 52 then within three months of each new sexual partner.  Diabetes- Have your blood sugar checked regularly if you have high blood pressure, high cholesterol, a family history of diabetes or if you are overweight.  Get these vaccines  Flu shot- Every fall.  Tetanus shot- Every 10 years.  Menactra-  Single dose; prevents meningitis.  Take these steps  Don't smoke- If you do smoke, ask your healthcare provider about quitting. For tips on how to quit, go to www.smokefree.gov or call 1-800-QUIT-NOW.  Be  physically active- Exercise 5 days a week for at least 30 minutes.  If you are not already physically active start slow and gradually work up to 30 minutes of moderate physical activity.  Examples of moderate activity include walking briskly, mowing the yard, dancing, swimming bicycling, etc.  Eat a healthy diet- Eat a variety of healthy foods such as fruits, vegetables, low fat milk, low fat cheese, yogurt, lean meats, poultry, fish, beans, tofu, etc.  For more information on healthy eating, go to www.thenutritionsource.org  Drink alcohol in moderation- Limit alcohol intake two drinks or less a day.  Never drink and drive.  Dentist- Brush and floss teeth twice daily; visit your dentis twice a year.  Depression-Your emotional health is as important as your physical health.  If you're feeling down, losing interest in things you normally enjoy please talk with your healthcare provider.  Gun Safety- If you keep a gun in your home, keep it unloaded and with the safety lock on.  Bullets should be stored separately.  Helmet use- Always wear a helmet when riding a motorcycle, bicycle, rollerblading or skateboarding.  Safe sex- If you may be exposed to a sexually transmitted infection, use a condom  Seat belts- Seat bels can save your life; always wear one.  Smoke/Carbon Monoxide detectors- These detectors need to be installed on the appropriate level of your home.  Replace batteries at least once a year.  Skin Cancer- When out in the sun, cover up and use sunscreen SPF 15 or higher.  Violence- If anyone is threatening or hurting you, please tell your healthcare provider.

## 2013-10-10 NOTE — Progress Notes (Signed)
   Subjective:    Patient ID: Darren Best, male    DOB: December 22, 1970, 43 y.o.   MRN: [id number]  HPI    Review of Systems  Constitutional: Negative.   HENT: Negative.   Eyes: Negative.   Respiratory: Negative.        Has a small ache every now and then but thinks it might be his esophagus.  Cardiovascular: Negative.   Gastrointestinal: Negative.   Endocrine: Negative.   Genitourinary: Negative.   Musculoskeletal: Negative.   Skin: Negative.   Allergic/Immunologic: Negative.   Neurological: Negative.   Hematological: Negative.   Psychiatric/Behavioral: Negative.        Objective:   Physical Exam        Assessment & Plan:

## 2013-10-10 NOTE — Progress Notes (Addendum)
Subjective:  This chart was scribed for Darren Agreste, MD, by Darren Best, ED Scribe. This patient's care was started at 3:21 PM.    Patient ID: Darren Best, male    DOB: 03-May-1970, 43 y.o.   MRN: [id number]  Chief Complaint  Patient presents with  . Annual Exam    HPI  Darren Best is a 43 y.o. male.  PCP: Darren Agreste, MD  1. Tetanus: January 2014 had T-Dap booster.  2. Exercise: The pt exercises 2-3 times a week. He reports he may ride his mountain bike for two hours or run thirty minutes to an hour. He also reports a h/o intermittent tendonitis to his right foot.    3. Dentist: He sees his dentist twice a year.   4. Optho: Dr. Lynnae Prude is his optometris, and he recently had lasix surgery with Dr. Gershon Crane. The pt states he wears reading glasses as needed.   5. Depression screening: 0/2  Last physical with me in 19 months ago in January 2014. Chest pains at the visit. Suspected GERD. Treated with PPI if needed and to RTC if persistent.   IBS: Has Levsin 0.125 mg as needed. He reports the Levsin prescription is finished, and he took it once a week. He reports he often awakens with an upset stomach.  In the office, Darren Best reports he is doing well. He has no specific concerns, though he endorses intermittent chest pain which is the same he had in January 2014 at the last physical. He states he has experienced the pain once every few months; he states the pain is noticeable upon awakening and remains unchanged throughout the day. He reports the pain does not change with eating, twisting, or movement. Exertion ostensibly improves the pain. He reports he takes ranitidine three to four times a week and Prilosec a few times a month as needed. EKG performed last year: sinus bradycardia rate 55 no acute findings.   The pt reports his father experienced heart issues in his 67's. He denies uncles with early-onset cardiac disease. He is a non-smoker. He reports he drinks 2  beers/night.   He reports a grandparent with colon cancer in her 7's. He denies a family h/o prostate cancer.   He uses medication as needed for seasonal allergies.   Darren Best denies need for STD testing stating he is in a monogamous relationship. He voices he had a Hepatitis C test performed previously.   There are no active problems to display for this patient.  Past Medical History  Diagnosis Date  . Irritable bowel syndrome   . Elevated LFTs   . GERD (gastroesophageal reflux disease)   . History of shingles   . Allergy    Past Surgical History  Procedure Laterality Date  . Lasix surgery     No Known Allergies  Prior to Admission medications   Medication Sig Start Date End Date Taking? Authorizing Provider  hyoscyamine (LEVSIN, ANASPAZ) 0.125 MG tablet Take 1 tablet (0.125 mg total) by mouth every 4 (four) hours as needed for cramping. 12/22/12  Yes Eleanore Kurtis Bushman, PA-C  omeprazole (PRILOSEC OTC) 20 MG tablet Take 20 mg by mouth daily.     Yes Historical Provider, MD  ranitidine (ZANTAC) 150 MG capsule Take 150 mg by mouth 2 (two) times daily.   Yes Historical Provider, MD  esomeprazole (NEXIUM) 40 MG capsule Take 1 capsule (40 mg total) by mouth daily. 06/25/12 06/25/13  Chelle Janalee Dane, PA-C  fexofenadine (  ALLEGRA) 180 MG tablet Take 1 tablet (180 mg total) by mouth daily. 06/13/12   Darren Agreste, MD  pantoprazole (PROTONIX) 40 MG tablet Take 1 tablet (40 mg total) by mouth daily. 06/29/12   Collene Leyden, PA-C    Review of Systems  Constitutional: Negative for fatigue and unexpected weight change.  Eyes: Negative for visual disturbance.  Respiratory: Negative for cough, chest tightness and shortness of breath.   Cardiovascular: Negative for chest pain, palpitations and leg swelling.  Gastrointestinal: Negative for abdominal pain and blood in stool.  Neurological: Negative for dizziness, light-headedness and headaches.   13 point review of systems per patient  health survey noted.  Negative other than as indicated on reviewed nursing note.       Objective:   Physical Exam  Vitals reviewed. Constitutional: He is oriented to person, place, and time. He appears well-developed and well-nourished.  HENT:  Head: Normocephalic and atraumatic.  Right Ear: External ear normal.  Left Ear: External ear normal.  Mouth/Throat: Oropharynx is clear and moist.  Eyes: Conjunctivae and EOM are normal. Pupils are equal, round, and reactive to light.  Neck: Normal range of motion. Neck supple. No JVD present. Carotid bruit is not present. No thyromegaly present.  Cardiovascular: Normal rate, regular rhythm, normal heart sounds and intact distal pulses.   No murmur heard. Pulmonary/Chest: Effort normal and breath sounds normal. No respiratory distress. He has no wheezes. He has no rales.  Unable to reproduce CP with palpation  Abdominal: Soft. He exhibits no distension. There is no tenderness. Hernia confirmed negative in the right inguinal area and confirmed negative in the left inguinal area.  Musculoskeletal: Normal range of motion. He exhibits no edema and no tenderness.  Lymphadenopathy:    He has no cervical adenopathy.  Neurological: He is alert and oriented to person, place, and time. He has normal reflexes.  Skin: Skin is warm and dry.  Psychiatric: He has a normal mood and affect. His behavior is normal.    Filed Vitals:   10/10/13 1451  BP: 127/81  Pulse: 66  Temp: 98.6 F (37 C)  TempSrc: Oral  Resp: 16  Height: 5' 11.25" (1.81 m)  Weight: 177 lb 12.8 oz (80.65 kg)  SpO2: 96%    Visual Acuity Screening   Right eye Left eye Both eyes  Without correction: 20/15 20/15 20/15   With correction:          Assessment & Plan:    Darren Best is a 43 y.o. male Annual physical exam - Plan: COMPLETE METABOLIC PANEL WITH GFR, Lipid panel, CBC with Differential, TSH  --anticipatory guidance as below in AVS, screening labs above. Health  maintenance items as above in HPI discussed/recommended as applicable.   IBS (irritable bowel syndrome) - Plan: hyoscyamine (LEVSIN, ANASPAZ) 0.125 MG tablet  -intermittent, but stable symptoms. Continue Levsin as needed.   Heartburn  - handout given on foods to avoid and common triggers. Trial of zantac first, then if needed - PPI (prilosec otc). If persistent, may need to have GI eval further. Last endoscopy in 2012.  Chest pain, unspecified - Plan: COMPLETE METABOLIC PANEL WITH GFR, Lipid panel, CBC with Differential, TSH  - nonexertional, and intermittent/rare sx's.  Nature of sx's suspect GERD/GI source, and if persistent with adjustment of diet and H2/PPI as above, may need repeat GI eval as DDX of achalasia or other esophageal cause. Rtc/ER precautions.  Meds ordered this encounter  Medications  . ranitidine (ZANTAC) 150 MG  capsule    Sig: Take 150 mg by mouth 2 (two) times daily.  . hyoscyamine (LEVSIN, ANASPAZ) 0.125 MG tablet    Sig: Take 1 tablet (0.125 mg total) by mouth every 4 (four) hours as needed for cramping.    Dispense:  30 tablet    Refill:  3   Patient Instructions  Try to avoid common foods that can contribute to heartburn below, and can use zantac twice per day over the counter as needed. If chest pains still persist- recheck in next month or two to look at other causes. Return to the clinic or go to the nearest emergency room if any of your symptoms worsen or new symptoms occur. You should receive a call or letter about your lab results within the next week to 10 days.   Food Choices for Gastroesophageal Reflux Disease When you have gastroesophageal reflux disease (GERD), the foods you eat and your eating habits are very important. Choosing the right foods can help ease the discomfort of GERD. WHAT GENERAL GUIDELINES DO I NEED TO FOLLOW?  Choose fruits, vegetables, whole grains, low-fat dairy products, and low-fat meat, fish, and poultry.  Limit fats such as  oils, salad dressings, butter, nuts, and avocado.  Keep a food diary to identify foods that cause symptoms.  Avoid foods that cause reflux. These may be different for different people.  Eat frequent small meals instead of three large meals each day.  Eat your meals slowly, in a relaxed setting.  Limit fried foods.  Cook foods using methods other than frying.  Avoid drinking alcohol.  Avoid drinking large amounts of liquids with your meals.  Avoid bending over or lying down until 2-3 hours after eating. WHAT FOODS ARE NOT RECOMMENDED? The following are some foods and drinks that may worsen your symptoms: Vegetables Tomatoes. Tomato juice. Tomato and spaghetti sauce. Chili peppers. Onion and garlic. Horseradish. Fruits Oranges, grapefruit, and lemon (fruit and juice). Meats High-fat meats, fish, and poultry. This includes hot dogs, ribs, ham, sausage, salami, and bacon. Dairy Whole milk and chocolate milk. Sour cream. Cream. Butter. Ice cream. Cream cheese.  Beverages Coffee and tea, with or without caffeine. Carbonated beverages or energy drinks. Condiments Hot sauce. Barbecue sauce.  Sweets/Desserts Chocolate and cocoa. Donuts. Peppermint and spearmint. Fats and Oils High-fat foods, including [place] fries and potato chips. Other Vinegar. Strong spices, such as black pepper, white pepper, red pepper, cayenne, curry powder, cloves, ginger, and chili powder. The items listed above may not be a complete list of foods and beverages to avoid. Contact your dietitian for more information. Document Released: 03/24/2005 Document Revised: 03/29/2013 Document Reviewed: 01/26/2013 [facility] Patient Information 2015 [place], [place]. This information is not intended to replace advice given to you by your health care provider. Make sure you discuss any questions you have with your health care provider.    Keeping you healthy  Get these tests  Blood pressure- Have your blood pressure  checked once a year by your healthcare provider.  Normal blood pressure is 120/80  Weight- Have your body mass index (BMI) calculated to screen for obesity.  BMI is a measure of body fat based on height and weight. You can also calculate your own BMI at [employer].  Cholesterol- Have your cholesterol checked every year.  Diabetes- Have your blood sugar checked regularly if you have high blood pressure, high cholesterol, have a family history of diabetes or if you are overweight.  Screening for Colon Cancer- Colonoscopy starting at age 65.  Screening may begin sooner depending on your family history and other health conditions. Follow up colonoscopy as directed by your Gastroenterologist.  Screening for Prostate Cancer- Both blood work (PSA) and a rectal exam help screen for Prostate Cancer.  Screening begins at age 69 with African-American men and at age 61 with Caucasian men.  Screening may begin sooner depending on your family history.  Take these medicines  Aspirin- One aspirin daily can help prevent Heart disease and Stroke.  Flu shot- Every fall.  Tetanus- Every 10 years.  Zostavax- Once after the age of 53 to prevent Shingles.  Pneumonia shot- Once after the age of 17; if you are younger than 59, ask your healthcare provider if you need a Pneumonia shot.  Take these steps  Don't smoke- If you do smoke, talk to your doctor about quitting.  For tips on how to quit, go to www.smokefree.gov or call 1-800-QUIT-NOW.  Be physically active- Exercise 5 days a week for at least 30 minutes.  If you are not already physically active start slow and gradually work up to 30 minutes of moderate physical activity.  Examples of moderate activity include walking briskly, mowing the yard, dancing, swimming, bicycling, etc.  Eat a healthy diet- Eat a variety of healthy food such as fruits, vegetables, low fat milk, low fat cheese, yogurt, lean meant, poultry, fish, beans, tofu, etc. For  more information go to www.thenutritionsource.org  Drink alcohol in moderation- Limit alcohol intake to less than two drinks a day. Never drink and drive.  Dentist- Brush and floss twice daily; visit your dentist twice a year.  Depression- Your emotional health is as important as your physical health. If you're feeling down, or losing interest in things you would normally enjoy please talk to your healthcare provider.  Eye exam- Visit your eye doctor every year.  Safe sex- If you may be exposed to a sexually transmitted infection, use a condom.  Seat belts- Seat belts can save your life; always wear one.  Smoke/Carbon Monoxide detectors- These detectors need to be installed on the appropriate level of your home.  Replace batteries at least once a year.  Skin cancer- When out in the sun, cover up and use sunscreen 15 SPF or higher.  Violence- If anyone is threatening you, please tell your healthcare provider.  Living Will/ Health care power of attorney- Speak with your healthcare provider and family.    I personally performed the services described in this documentation, which was scribed in my presence. The recorded information has been reviewed and considered, and addended by me as needed.

## 2013-10-11 LAB — LIPID PANEL
CHOLESTEROL: 209 mg/dL — AB (ref 0–200)
HDL: 67 mg/dL (ref >39)
LDL Cholesterol: 131 mg/dL — ABNORMAL HIGH (ref 0–99)
Total CHOL/HDL Ratio: 3.1 Ratio
Triglycerides: 56 mg/dL (ref <150)
VLDL: 11 mg/dL (ref 0–40)

## 2013-10-11 LAB — TSH: TSH: 3.309 u[IU]/mL (ref 0.350–4.500)

## 2013-10-11 LAB — COMPLETE METABOLIC PANEL WITH GFR
ALBUMIN: 4.3 g/dL (ref 3.5–5.2)
ALT: 13 U/L (ref 0–53)
AST: 19 U/L (ref 0–37)
Alkaline Phosphatase: 48 U/L (ref 39–117)
BILIRUBIN TOTAL: 0.4 mg/dL (ref 0.2–1.2)
BUN: 10 mg/dL (ref 6–23)
CO2: 25 meq/L (ref 19–32)
Calcium: 9.4 mg/dL (ref 8.4–10.5)
Chloride: 104 mEq/L (ref 96–112)
Creat: 0.91 mg/dL (ref 0.50–1.35)
GLUCOSE: 107 mg/dL — AB (ref 70–99)
POTASSIUM: 4.6 meq/L (ref 3.5–5.3)
SODIUM: 139 meq/L (ref 135–145)
TOTAL PROTEIN: 7.3 g/dL (ref 6.0–8.3)

## 2013-11-07 ENCOUNTER — Encounter: Payer: Self-pay | Admitting: Family Medicine

## 2013-11-07 DIAGNOSIS — K219 Gastro-esophageal reflux disease without esophagitis: Secondary | ICD-10-CM

## 2013-11-07 MED ORDER — ESOMEPRAZOLE MAGNESIUM 40 MG PO CPDR: 40.0000 mg | DELAYED_RELEASE_CAPSULE | Freq: Every day | ORAL | Status: DC

## 2014-01-20 ENCOUNTER — Other Ambulatory Visit: Payer: Self-pay

## 2014-06-27 ENCOUNTER — Other Ambulatory Visit: Payer: Self-pay | Admitting: Family Medicine

## 2014-06-28 ENCOUNTER — Encounter: Payer: Self-pay | Admitting: Family Medicine

## 2014-07-05 NOTE — Telephone Encounter (Signed)
-----   Message -----      From: Zannie Kehr     Sent: 06/28/2014  3:11 PM      To: [facility] Clinical Message Pool    Subject: Non-Urgent Medical Question                 Hello,        I am out of esomeprazole 40 MG capsule and have no additional refills according to [facility]. Could I please have this rx renewed?        Thank you,    Darren Best, music Size

## 2014-08-24 ENCOUNTER — Other Ambulatory Visit: Payer: Self-pay | Admitting: Family Medicine

## 2014-08-25 ENCOUNTER — Other Ambulatory Visit: Payer: Self-pay

## 2014-08-25 MED ORDER — ESOMEPRAZOLE MAGNESIUM 40 MG PO CPDR: 40.0000 mg | DELAYED_RELEASE_CAPSULE | Freq: Every day | ORAL | Status: DC

## 2014-10-11 ENCOUNTER — Encounter: Payer: Self-pay | Admitting: Family Medicine

## 2014-10-16 ENCOUNTER — Encounter: Payer: Self-pay | Admitting: Family Medicine

## 2014-10-16 ENCOUNTER — Ambulatory Visit (INDEPENDENT_AMBULATORY_CARE_PROVIDER_SITE_OTHER): Payer: 59 | Admitting: Family Medicine

## 2014-10-16 VITALS — BP 118/78 | HR 51 | Temp 98.3°F | Resp 16 | Ht 71.5 in | Wt 185.0 lb

## 2014-10-16 DIAGNOSIS — R5383 Other fatigue: Secondary | ICD-10-CM | POA: Diagnosis not present

## 2014-10-16 DIAGNOSIS — Z0000 Encounter for general adult medical examination without abnormal findings: Secondary | ICD-10-CM

## 2014-10-16 DIAGNOSIS — K219 Gastro-esophageal reflux disease without esophagitis: Secondary | ICD-10-CM

## 2014-10-16 DIAGNOSIS — Z13220 Encounter for screening for lipoid disorders: Secondary | ICD-10-CM | POA: Diagnosis not present

## 2014-10-16 DIAGNOSIS — R0789 Other chest pain: Secondary | ICD-10-CM

## 2014-10-16 LAB — COMPLETE METABOLIC PANEL WITH GFR
ALK PHOS: 46 U/L (ref 39–117)
ALT: 13 U/L (ref 0–53)
AST: 19 U/L (ref 0–37)
Albumin: 4.4 g/dL (ref 3.5–5.2)
BUN: 14 mg/dL (ref 6–23)
CALCIUM: 9.3 mg/dL (ref 8.4–10.5)
CO2: 27 meq/L (ref 19–32)
CREATININE: 0.79 mg/dL (ref 0.50–1.35)
Chloride: 103 mEq/L (ref 96–112)
GFR, Est African American: >89 mL/min
GLUCOSE: 99 mg/dL (ref 70–99)
Potassium: 4.3 mEq/L (ref 3.5–5.3)
Sodium: 140 mEq/L (ref 135–145)
Total Bilirubin: 0.6 mg/dL (ref 0.2–1.2)
Total Protein: 7.1 g/dL (ref 6.0–8.3)

## 2014-10-16 LAB — LIPID PANEL
CHOL/HDL RATIO: 3.7 ratio
Cholesterol: 201 mg/dL — ABNORMAL HIGH (ref 0–200)
HDL: 55 mg/dL (ref >=40)
LDL CALC: 129 mg/dL — AB (ref 0–99)
Triglycerides: 85 mg/dL (ref <150)
VLDL: 17 mg/dL (ref 0–40)

## 2014-10-16 LAB — CBC WITH DIFFERENTIAL/PLATELET
Basophils Absolute: 0 10*3/uL (ref 0.0–0.1)
Basophils Relative: 1 % (ref 0–1)
EOS ABS: 0.6 10*3/uL (ref 0.0–0.7)
Eosinophils Relative: 14 % — ABNORMAL HIGH (ref 0–5)
HCT: 41 % (ref 39.0–52.0)
HEMOGLOBIN: 14.5 g/dL (ref 13.0–17.0)
Lymphocytes Relative: 34 % (ref 12–46)
Lymphs Abs: 1.4 10*3/uL (ref 0.7–4.0)
MCH: 31.2 pg (ref 26.0–34.0)
MCHC: 35.4 g/dL (ref 30.0–36.0)
MCV: 88.2 fL (ref 78.0–100.0)
MPV: 10.3 fL (ref 8.6–12.4)
Monocytes Absolute: 0.4 10*3/uL (ref 0.1–1.0)
Monocytes Relative: 9 % (ref 3–12)
Neutro Abs: 1.7 10*3/uL (ref 1.7–7.7)
Neutrophils Relative %: 42 % — ABNORMAL LOW (ref 43–77)
Platelets: 259 10*3/uL (ref 150–400)
RBC: 4.65 MIL/uL (ref 4.22–5.81)
RDW: 13.9 % (ref 11.5–15.5)
WBC: 4.1 10*3/uL (ref 4.0–10.5)

## 2014-10-16 LAB — TSH: TSH: 1.837 u[IU]/mL (ref 0.350–4.500)

## 2014-10-16 MED ORDER — ESOMEPRAZOLE MAGNESIUM 40 MG PO CPDR: 40.0000 mg | DELAYED_RELEASE_CAPSULE | Freq: Every day | ORAL | Status: DC

## 2014-10-16 NOTE — Patient Instructions (Addendum)
I will check liver tests, but if pain persists in rib area - return in next month for xrays or other imaging in this area.  You should receive a call or letter about your lab results within the next week to 10 days.   Call Dr. Olevia Perches for follow up of abdominal symptoms.    Keeping you healthy  Get these tests  Blood pressure- Have your blood pressure checked once a year by your healthcare provider.  Normal blood pressure is 120/80.  Weight- Have your body mass index (BMI) calculated to screen for obesity.  BMI is a measure of body fat based on height and weight. You can also calculate your own BMI at [employer]Bags[employer]it.  Cholesterol- Have your cholesterol checked regularly starting at age 29, sooner may be necessary if you have diabetes, high blood pressure, if a family member developed heart diseases at an early age or if you smoke.   Chlamydia, HIV, and other sexual transmitted disease- Get screened each year until the age of 19 then within three months of each new sexual partner.  Diabetes- Have your blood sugar checked regularly if you have high blood pressure, high cholesterol, a family history of diabetes or if you are overweight.  Get these vaccines  Flu shot- Every fall.  Tetanus shot- Every 10 years.  Menactra- Single dose; prevents meningitis.  Take these steps  Don't smoke- If you do smoke, ask your healthcare provider about quitting. For tips on how to quit, go to www.smokefree.gov or call 1-800-QUIT-NOW.  Be physically active- Exercise 5 days a week for at least 30 minutes.  If you are not already physically active start slow and gradually work up to 30 minutes of moderate physical activity.  Examples of moderate activity include walking briskly, mowing the yard, dancing, swimming bicycling, etc.  Eat a healthy diet- Eat a variety of healthy foods such as fruits, vegetables, low fat milk, low fat cheese, yogurt, lean meats, poultry, fish, beans, tofu, etc.  For  more information on healthy eating, go to www.thenutritionsource.org  Drink alcohol in moderation- Limit alcohol intake two drinks or less a day.  Never drink and drive.  Dentist- Brush and floss teeth twice daily; visit your dentis twice a year.  Depression-Your emotional health is as important as your physical health.  If you're feeling down, losing interest in things you normally enjoy please talk with your healthcare provider.  Gun Safety- If you keep a gun in your home, keep it unloaded and with the safety lock on.  Bullets should be stored separately.  Helmet use- Always wear a helmet when riding a motorcycle, bicycle, rollerblading or skateboarding.  Safe sex- If you may be exposed to a sexually transmitted infection, use a condom  Seat belts- Seat bels can save your life; always wear one.  Smoke/Carbon Monoxide detectors- These detectors need to be installed on the appropriate level of your home.  Replace batteries at least once a year.  Skin Cancer- When out in the sun, cover up and use sunscreen SPF 15 or higher.  Violence- If anyone is threatening or hurting you, please tell your healthcare provider.

## 2014-10-16 NOTE — Progress Notes (Signed)
Subjective:    Patient ID: Darren Best, male    DOB: 11-29-1970, 44 y.o.   MRN: [id number] This chart was scribed for Darren Ray, MD by Zola Button, Medical Scribe. This patient was seen in Room 22 and the patient's care was started at 3:41 PM.   HPI HPI Comments: Darren Best is a 44 y.o. male with a history of GERD and IBS who presents to the [facility] for a complete physical exam as well as acute complaints. Per email 5 days ago, occasional abdominal discomfort RUQ especially when leaning forward. Did note in the email that he normally drinks 2-3 beers per day but had cut back to 1 or 0 over the past few weeks.   Abdominal Pain: Patient has had intermittent, burning RUQ abdominal pain at the base of the ribs that started 2-3 months ago. He has been having the pain about 1-2 times a week. He noticed a red mark to the area initially, but is no longer there. The pain has not improved or worsened. The pain is worsened when leaning forward. He last had pain 3 days ago. He has not tried any treatments for the pain. No pain with inspiration. Patient denies nausea, vomiting, diaphoresis, fever, and unexplained weight loss. He also denies any injuries. His wife, who is a physician, thinks it may be related to his liver and his alcohol consumption. He seems to have less pain when he does not drink as much alcohol. Patient notes he does not have good posture sitting or sleeping.  GERD: Patient is followed by Dr. Olevia Perches with most recent endoscopy 2 years ago. He takes Nexium qd in the mornings. He ran out of the Nexium about a week ago and requests a refill. He is also followed by Dr. Olevia Perches for IBS; he takes hyoscyamine for this rarely, about once a month.  Fatigue: Patient notes feeling fatigued on some days after waking up. He thinks it may be related to his IBS.   Immunizations:  Immunization History  Administered Date(s) Administered  . Tdap 04/26/2012   Exercise:  Patient reports exercising 2-3 times a week. He consistently exercises every Monday with a running group. He sometimes walks to work, which is 1 mile from his house.  Depression Screening:  Depression screen [facility] 2/9 10/16/2014 10/10/2013  Decreased Interest 0 0  Down, Depressed, Hopeless 0 0  PHQ - 2 Score 0 0   Vision Screening:   Visual Acuity Screening   Right eye Left eye Both eyes  Without correction: 20/15 20/15 20/15   With correction:       Dentist: He did see a dentist 3 months ago.  STI Testing: Patient is married and does not need STI testing.   Cancer Screening: FMHx includes colon cancer in his paternal grandmother. No FMHx of prostate cancer. Patient defers prostate cancer screening until next year.  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 His wife left the ER and works part-time at [facility] urgent care and part-time at an urgent care in [place]. She has been pursuing her artistic endeavors recently and has been Multimedia programmer.  There are no active problems to display for this patient.  Past Medical History  Diagnosis Date  . Irritable bowel syndrome   . Elevated LFTs   . GERD (gastroesophageal reflux disease)   . History of shingles   . Allergy    Past Surgical History  Procedure Laterality Date  . Lasix surgery    .  Eye surgery     No Known Allergies Prior to Admission medications   Medication Sig Start Date End Date Taking? Authorizing Provider  esomeprazole (NEXIUM) 40 MG capsule Take 1 capsule (40 mg total) by mouth daily. PATIENT NEEDS OFFICE VISIT FOR ADDITIONAL REFILLS 08/25/14  Yes [name], MD  hyoscyamine (LEVSIN, ANASPAZ) 0.125 MG tablet Take 1 tablet (0.125 mg total) by mouth every 4 (four) hours as needed for cramping. 10/10/13  Yes [name], MD  ranitidine (ZANTAC) 150 MG capsule Take 150 mg by mouth 2 (two) times daily.   Yes Historical Provider, MD   History   Social History  . Marital Status: Married    Spouse Name: N/A  .  Number of Children: 0  . Years of Education: N/A   Occupational History  . [employer]   Social History Main Topics  . Smoking status: Former Research scientist (life sciences)  . Smokeless tobacco: Never Used  . Alcohol Use: Yes     Comment: 2 drinks daily - 2 beers max  . Drug Use: No  . Sexual Activity: Yes     Comment: number of sex partners in the last 68 months 1   Other Topics Concern  . Not on file   Social History Narrative   College degree   [employer]   2-3 caffeine drinks daily   Exercise cycling and gym classes 2-3 times per week for 1-2 hours      Review of Systems 13 point ROS reviewed on patient health survey. Negative other than listed above or in nursing note. See nursing note.      Objective:   Physical Exam  Constitutional: He is oriented to person, place, and time. He appears well-developed and well-nourished. No distress.  HENT:  Head: Normocephalic and atraumatic.  Mouth/Throat: Oropharynx is clear and moist. No oropharyngeal exudate.  Eyes: Pupils are equal, round, and reactive to light.  Neck: Neck supple.  Cardiovascular: Normal rate.   Pulmonary/Chest: Effort normal. He exhibits no tenderness.  Right lower rib margin non-tender.  Abdominal: Soft. He exhibits no mass. There is no tenderness. There is negative Murphy's sign.  Area of reported discomfort is located at anterior lower rib margin. Unable to reproduce pain.  Musculoskeletal: He exhibits no edema.  Neurological: He is alert and oriented to person, place, and time. No cranial nerve deficit.  Skin: Skin is warm and dry. No rash noted.  Psychiatric: He has a normal mood and affect. His behavior is normal.  Vitals reviewed.     Filed Vitals:   10/16/14 1434  BP: 118/78  Pulse: 51  Temp: 98.3 F (36.8 C)  TempSrc: Oral  Resp: 16  Height: 5' 11.5" (1.816 m)  Weight: 185 lb (83.915 kg)  SpO2: 98%       Assessment & Plan:   ERIAN ROSENGREN is a 44 y.o. male Annual physical  exam  --anticipatory guidance as below in AVS, screening labs above. Health maintenance items as above in HPI discussed/recommended as applicable.   Screening for hyperlipidemia - Plan: COMPLETE METABOLIC PANEL WITH GFR, Lipid panel  Gastroesophageal reflux disease, esophagitis presence not specified - Plan: esomeprazole (NEXIUM) 40 MG capsule,  -Continue Nexium at same dose. Recommended follow-up with gastroenterologist for abdominal symptoms.  Right-sided chest wall pain   -check CMP, but area of discomfort appears to be lower ribs and possible costochondral junction/costochondritis.  Asymptomatic in office.  Symptomatic care discussed, but return to clinic if persists. Possible rib series  x-rays or CT scanning of that area if need be.   Other fatigue - Plan: CBC with Differential/Platelet, TSH  -Check CBC, TSH and CMP as above. Return to clinic discuss further if persists  Meds ordered this encounter  Medications  . esomeprazole (NEXIUM) 40 MG capsule    Sig: Take 1 capsule (40 mg total) by mouth daily.    Dispense:  90 capsule    Refill:  3   Patient Instructions  I will check liver tests, but if pain persists in rib area - return in next month for xrays or other imaging in this area.  You should receive a call or letter about your lab results within the next week to 10 days.   Call Dr. Olevia Perches for follow up of abdominal symptoms.    Keeping you healthy  Get these tests  Blood pressure- Have your blood pressure checked once a year by your healthcare provider.  Normal blood pressure is 120/80.  Weight- Have your body mass index (BMI) calculated to screen for obesity.  BMI is a measure of body fat based on height and weight. You can also calculate your own BMI at GravelBags.it.  Cholesterol- Have your cholesterol checked regularly starting at age 29, sooner may be necessary if you have diabetes, high blood pressure, if a family member developed heart diseases at an  early age or if you smoke.   Chlamydia, HIV, and other sexual transmitted disease- Get screened each year until the age of 71 then within three months of each new sexual partner.  Diabetes- Have your blood sugar checked regularly if you have high blood pressure, high cholesterol, a family history of diabetes or if you are overweight.  Get these vaccines  Flu shot- Every fall.  Tetanus shot- Every 10 years.  Menactra- Single dose; prevents meningitis.  Take these steps  Don't smoke- If you do smoke, ask your healthcare provider about quitting. For tips on how to quit, go to www.smokefree.gov or call 1-800-QUIT-NOW.  Be physically active- Exercise 5 days a week for at least 30 minutes.  If you are not already physically active start slow and gradually work up to 30 minutes of moderate physical activity.  Examples of moderate activity include walking briskly, mowing the yard, dancing, swimming bicycling, etc.  Eat a healthy diet- Eat a variety of healthy foods such as fruits, vegetables, low fat milk, low fat cheese, yogurt, lean meats, poultry, fish, beans, tofu, etc.  For more information on healthy eating, go to www.thenutritionsource.org  Drink alcohol in moderation- Limit alcohol intake two drinks or less a day.  Never drink and drive.  Dentist- Brush and floss teeth twice daily; visit your dentis twice a year.  Depression-Your emotional health is as important as your physical health.  If you're feeling down, losing interest in things you normally enjoy please talk with your healthcare provider.  Gun Safety- If you keep a gun in your home, keep it unloaded and with the safety lock on.  Bullets should be stored separately.  Helmet use- Always wear a helmet when riding a motorcycle, bicycle, rollerblading or skateboarding.  Safe sex- If you may be exposed to a sexually transmitted infection, use a condom  Seat belts- Seat bels can save your life; always wear one.  Smoke/Carbon  Monoxide detectors- These detectors need to be installed on the appropriate level of your home.  Replace batteries at least once a year.  Skin Cancer- When out in the sun, cover up and use  sunscreen SPF 15 or higher.  Violence- If anyone is threatening or hurting you, please tell your healthcare provider.    I personally performed the services described in this documentation, which was scribed in my presence. The recorded information has been reviewed and considered, and addended by me as needed.

## 2014-10-16 NOTE — Progress Notes (Signed)
   Subjective:    Patient ID: Darren Best, male    DOB: 10-24-1970, 44 y.o.   MRN: [id number]  HPI    Review of Systems  Constitutional: Negative.   HENT: Negative.   Eyes: Negative.   Respiratory: Negative.   Cardiovascular: Negative.   Gastrointestinal: Negative.   Endocrine: Negative.   Genitourinary: Negative.   Musculoskeletal: Negative.   Skin: Negative.   Allergic/Immunologic: Negative.   Neurological: Negative.   Hematological: Negative.   Psychiatric/Behavioral: Negative.        Objective:   Physical Exam        Assessment & Plan:

## 2015-01-24 ENCOUNTER — Encounter: Payer: Self-pay | Admitting: Gastroenterology

## 2015-03-12 ENCOUNTER — Encounter: Payer: Self-pay | Admitting: Gastroenterology

## 2015-05-15 ENCOUNTER — Ambulatory Visit (INDEPENDENT_AMBULATORY_CARE_PROVIDER_SITE_OTHER): Payer: 59 | Admitting: Gastroenterology

## 2015-05-15 ENCOUNTER — Encounter: Payer: Self-pay | Admitting: Gastroenterology

## 2015-05-15 VITALS — BP 116/68 | HR 60 | Ht 71.0 in | Wt 184.0 lb

## 2015-05-15 DIAGNOSIS — K589 Irritable bowel syndrome without diarrhea: Secondary | ICD-10-CM | POA: Diagnosis not present

## 2015-05-15 DIAGNOSIS — R0789 Other chest pain: Secondary | ICD-10-CM

## 2015-05-15 MED ORDER — RIFAXIMIN 550 MG PO TABS: 550.0000 mg | ORAL_TABLET | Freq: Three times a day (TID) | ORAL | Status: DC

## 2015-05-15 MED ORDER — HYOSCYAMINE SULFATE 0.125 MG PO TABS: 0.1250 mg | ORAL_TABLET | ORAL | Status: DC | PRN

## 2015-05-15 MED FILL — ANASPAZ 0.125 MG TABLET ODT: 0.125 | 5 days supply | Qty: 30 | Fill #0

## 2015-05-15 NOTE — Patient Instructions (Signed)
Please start Low FODMAP diet. Packet given to you today in the office.  We have sent medications to your pharmacy for you to pick up at your convenience.

## 2015-05-15 NOTE — Progress Notes (Signed)
HPI :  45 y/o male with a reported history of suspected IBS, here for consultation for further evaluation for this issue and chest discomfort.   He has roughly 2 BMs in the AM, and multuple BMS in the afternoon. He can have days of where he is home due to feeling poorly, speciifallly over the holidays.  He thinks perhaps 4 BMs per day on avarage. He reports his stool varies between normal form and loose. No blood in the stools. He has increased gas and bloating, which can cause some discomfort. He has not taken hyocyamine in a long time. No weight loss. After he has a bowel movement he has some relief of his symptoms. He has some periodic burning sensation in his RUQ, was not sure if it was musculoskeletal. No vomiting routinely. He thinks these symptoms have been ongoing for a long time, he has been told he has IBS for the majority of his life. He has never had a prior colonoscopy. Grandmother had either colon or stomach cancer, he is not sure. No first degree relatives with CRC.  He also has intermittent discomfort in his chest, he thinks ongoing for a year or so. He has not had dysphagia for a few years, but he has had it in the past which used to bother him significantly. No odynophagia. He does have some pyrosis at times. He has occasional discomfort in his substernal area. He has had this over the holidays, but overall he has it once per month. He thinks it will last for a day or so at a time. No exertional component, it bothers him sporadically. No shortnesso f breath. He is part of a running group and exercises routinely without difficulty. He is taking nexium once per day at baseline, although he is trying to back off due to fear of it causing "dementia". He is taking it every M/W/F due to fear of side effects. If he does not take it, he will notice heartburn recur. He is not sure if he has had a prior food impaction although this has been noted in his chart. He endorses history of dysphagia to  meats in the past. He had an EGD in 2012 showing an esophageal stricture and elevated Eos to 37/HPF.  He reports several members if his family have digestive issues, one of which he thinks has celiac or food intolerances   EGD 01/2011 - distal esophageal stricture, dilated with 39mm Savory, increased Eos to 82 / HPF EGD 06/1999 - normal EGD   Past Medical History  Diagnosis Date  . Irritable bowel syndrome   . GERD (gastroesophageal reflux disease)   . History of shingles   . Allergy   . Dysphagia      Past Surgical History  Procedure Laterality Date  . Lasix surgery    . Eye surgery     Family History  Problem Relation Age of Onset  . Depression Father   . Heart disease Father     some coronary issues  . Colon cancer Paternal Grandmother   . Diabetes Mother     adult onset  . Diabetes Brother   . Aneurysm Father     kidney   Social History  Substance Use Topics  . Smoking status: Former Research scientist (life sciences)  . Smokeless tobacco: Never Used  . Alcohol Use: Yes     Comment: 2 drinks daily - 2 beers max   Current Outpatient Prescriptions  Medication Sig Dispense Refill  . esomeprazole (NEXIUM) 40 MG  capsule Take 1 capsule (40 mg total) by mouth daily. 90 capsule 3  . famotidine (PEPCID) 20 MG tablet Take 20 mg by mouth as needed for heartburn or indigestion.    . hyoscyamine (LEVSIN, ANASPAZ) 0.125 MG tablet Take 1 tablet (0.125 mg total) by mouth every 4 (four) hours as needed for cramping. 30 tablet 3  . ranitidine (ZANTAC) 150 MG capsule Take 150 mg by mouth 2 (two) times daily.    . rifaximin (XIFAXAN) 550 MG TABS tablet Take 1 tablet (550 mg total) by mouth 3 (three) times daily. 42 tablet 0   No current facility-administered medications for this visit.   No Known Allergies   Review of Systems: All systems reviewed and negative except where noted in HPI.   Lab Results  Component Value Date   ALT 13 10/16/2014   AST 19 10/16/2014   ALKPHOS 46 10/16/2014   BILITOT  0.6 10/16/2014    Lab Results  Component Value Date   CREATININE 0.79 10/16/2014   BUN 14 10/16/2014   NA 140 10/16/2014   K 4.3 10/16/2014   CL 103 10/16/2014   CO2 27 10/16/2014    Lab Results  Component Value Date   WBC 4.1 10/16/2014   HGB 14.5 10/16/2014   HCT 41.0 10/16/2014   MCV 88.2 10/16/2014   PLT 259 10/16/2014     Physical Exam: BP 116/68 mmHg  Pulse 60  Ht 5\' 11"  (1.803 m)  Wt 184 lb (83.462 kg)  BMI 25.67 kg/m2 Constitutional: Pleasant,well-developed, male in no acute distress. HEENT: Normocephalic and atraumatic. Conjunctivae are normal. No scleral icterus. Neck supple.  Cardiovascular: Normal rate, regular rhythm.  Pulmonary/chest: Effort normal and breath sounds normal. No wheezing, rales or rhonchi. Abdominal: Soft, nondistended, nontender. Bowel sounds active throughout. There are no masses palpable. No hepatomegaly. Extremities: no edema Lymphadenopathy: No cervical adenopathy noted. Neurological: Alert and oriented to person place and time. Skin: Skin is warm and dry. No rashes noted. Psychiatric: Normal mood and affect. Behavior is normal.   ASSESSMENT AND PLAN: 45 y/o male with a history of GERD and dysphagia with prior esophageal stricture with biopsies that could be consistent with eosinophilic esophagitis, presenting with intermittent chest pain as outlined above for the past year. He also has had a peripheral eosinophilia. His constellation of findings are concerning for possible eosinophilic esophagitis, which if present, could be causing his chest discomfort. He does not have routine dysphagia at present time. Recommend he take nexium to treat for GERD and perform an upper endoscopy while on PPI to evaluate whether or not he has eosinophilic esophagitis. If he does have this, would consider a trial of flovent / budesonide and see if this helps his chest pain, and consider an allergy evaluation. He has an excellent exertional capacity and  exercises routinely, it is very unlikely to be cardiac in etiology. The indications, risks, and benefits of EGD were explained to the patient in detail. Risks include but are not limited to bleeding, perforation, adverse reaction to medications, and cardiopulmonary compromise. Sequelae include but are not limited to the possibility of surgery, hospitalization, and mortality. The patient verbalized understanding and wished to proceed. All questions answered, referred to scheduler. Further recommendations pending results of the exam.   Otherwise, for his IBS, symptoms are longstanding and stable, without alarm features or anemia. We will evaluate his small bowel with EGD and if he has EoE, rule out small bowel involvement, but otherwise I counseled him on a low FODMOP  diet and will try a course of Rifaximin for IBS. He can otherwise continue Levsin. He can follow up as needed for this issue. If he fails to improve or symptoms persist can consider colonoscopy, otherwise due at age 74 for screening purposes.   Ashley Cellar, MD [facility] Pager [phone]  CC: [name], MD

## 2015-05-16 ENCOUNTER — Other Ambulatory Visit: Payer: Self-pay

## 2015-05-16 DIAGNOSIS — R079 Chest pain, unspecified: Secondary | ICD-10-CM

## 2015-05-17 ENCOUNTER — Telehealth: Payer: Self-pay | Admitting: Gastroenterology

## 2015-05-17 NOTE — Telephone Encounter (Signed)
Noted  

## 2015-05-18 ENCOUNTER — Encounter: Payer: Self-pay | Admitting: Gastroenterology

## 2015-05-21 ENCOUNTER — Telehealth: Payer: Self-pay | Admitting: Gastroenterology

## 2015-05-21 MED ORDER — RIFAXIMIN 550 MG PO TABS: 550.0000 mg | ORAL_TABLET | Freq: Three times a day (TID) | ORAL | Status: DC

## 2015-05-21 NOTE — Telephone Encounter (Signed)
Sent to [place] outpatient pharmacy.  

## 2015-05-22 ENCOUNTER — Other Ambulatory Visit: Payer: Self-pay

## 2015-05-22 MED ORDER — RIFAXIMIN 550 MG PO TABS: 550.0000 mg | ORAL_TABLET | Freq: Three times a day (TID) | ORAL | Status: DC

## 2015-05-29 MED FILL — XIFAXAN 550 MG TABLET: 550 | 14 days supply | Qty: 42 | Fill #0

## 2015-06-12 ENCOUNTER — Encounter: Payer: Self-pay | Admitting: Gastroenterology

## 2015-06-12 ENCOUNTER — Ambulatory Visit (AMBULATORY_SURGERY_CENTER): Payer: 59 | Admitting: Gastroenterology

## 2015-06-12 VITALS — BP 113/80 | HR 67 | Temp 97.2°F | Resp 16 | Ht 71.0 in | Wt 184.0 lb

## 2015-06-12 DIAGNOSIS — R079 Chest pain, unspecified: Secondary | ICD-10-CM

## 2015-06-12 DIAGNOSIS — K219 Gastro-esophageal reflux disease without esophagitis: Secondary | ICD-10-CM | POA: Diagnosis not present

## 2015-06-12 DIAGNOSIS — K200 Eosinophilic esophagitis: Secondary | ICD-10-CM | POA: Diagnosis not present

## 2015-06-12 MED ORDER — SODIUM CHLORIDE 0.9 % IV SOLN: 500.0000 mL | INTRAVENOUS | Status: DC

## 2015-06-12 NOTE — Progress Notes (Signed)
Patient awakening,vss,report to rn 

## 2015-06-12 NOTE — Progress Notes (Signed)
Called to room to assist during endoscopic procedure.  Patient ID and intended procedure confirmed with present staff. Received instructions for my participation in the procedure from the performing physician.  

## 2015-06-12 NOTE — Op Note (Signed)
[place]  [address]. [place], [address]   ENDOSCOPY PROCEDURE REPORT  PATIENT: Edwards, Mindel  MR#: [id number] BIRTHDATE: 10-20-1970 , 44  yrs. old GENDER: male ENDOSCOPIST: Yetta Flock, MD REFERRED BY: PROCEDURE DATE:  06/12/2015 PROCEDURE:  EGD w/ biopsy ASA CLASS:     Class II INDICATIONS:  chest pain. MEDICATIONS: Propofol 300 mg IV TOPICAL ANESTHETIC:  DESCRIPTION OF PROCEDURE: After the risks benefits and alternatives of the procedure were thoroughly explained, informed consent was obtained.  The [id number] and [id number] endoscope was introduced through the mouth and advanced to the second portion of the duodenum , Without limitations.  The instrument was slowly withdrawn as the mucosa was fully examined.   FINDINGS: The esophagus had subtle trachealization and subtle furrowing were noted throughout the entire esophagus without stenosis or stricture.  Biopsies were taken from the lower and mid esophagus to rule out EoE.  The [facility] was noted 41cm from the incisors, with GEJ and SCJ located 40cm from the incisors, with 1cm hiatal hernia.  The stomach was normal in appearance.  The duodenal bulb and 2nd portion of the duodenum was normal as well. Retroflexed views revealed no abnormalities.     The scope was then withdrawn from the patient and the procedure completed.  COMPLICATIONS: There were no immediate complications.  ENDOSCOPIC IMPRESSION: Subtle changes of the esophagus which could be consistent with mild eosinophilic esophagitis. Biopsies obtained Normal stomach Normal duodenum    RECOMMENDATIONS: Resume diet Resume medications Await pathology results, further recommendations pending this result    eSigned:  Yetta Flock, MD 06/12/2015 10:29 AM    CC: the patient  PATIENT NAME:  Jamorie, Noguez MR#: [id number]

## 2015-06-12 NOTE — Patient Instructions (Signed)
YOU HAD AN ENDOSCOPIC PROCEDURE TODAY AT THE [facility]:   Refer to the procedure report that was given to you for any specific questions about what was found during the examination.  If the procedure report does not answer your questions, please call your gastroenterologist to clarify.  If you requested that your care partner not be given the details of your procedure findings, then the procedure report has been included in a sealed envelope for you to review at your convenience later.  YOU SHOULD EXPECT: Some feelings of bloating in the abdomen. Passage of more gas than usual.  Walking can help get rid of the air that was put into your GI tract during the procedure and reduce the bloating. If you had a lower endoscopy (such as a colonoscopy or flexible sigmoidoscopy) you may notice spotting of blood in your stool or on the toilet paper. If you underwent a bowel prep for your procedure, you may not have a normal bowel movement for a few days.  Please Note:  You might notice some irritation and congestion in your nose or some drainage.  This is from the oxygen used during your procedure.  There is no need for concern and it should clear up in a day or so.  SYMPTOMS TO REPORT IMMEDIATELY:   Following lower endoscopy (colonoscopy or flexible sigmoidoscopy):  Excessive amounts of blood in the stool  Significant tenderness or worsening of abdominal pains  Swelling of the abdomen that is new, acute  Fever of 100F or higher   Following upper endoscopy (EGD)  Vomiting of blood or coffee ground material  New chest pain or pain under the shoulder blades  Painful or persistently difficult swallowing  New shortness of breath  Fever of 100F or higher  Black, tarry-looking stools  For urgent or emergent issues, a gastroenterologist can be reached at any hour by calling [phone].   DIET: Your first meal following the procedure should be a small meal and then it is ok to progress to  your normal diet. Heavy or fried foods are harder to digest and may make you feel nauseous or bloated.  Likewise, meals heavy in dairy and vegetables can increase bloating.  Drink plenty of fluids but you should avoid alcoholic beverages for 24 hours.  ACTIVITY:  You should plan to take it easy for the rest of today and you should NOT DRIVE or use heavy machinery until tomorrow (because of the sedation medicines used during the test).    FOLLOW UP: Our staff will call the number listed on your records the next business day following your procedure to check on you and address any questions or concerns that you may have regarding the information given to you following your procedure. If we do not reach you, we will leave a message.  However, if you are feeling well and you are not experiencing any problems, there is no need to return our call.  We will assume that you have returned to your regular daily activities without incident.  If any biopsies were taken you will be contacted by phone or by letter within the next 1-3 weeks.  Please call us at [phone] if you have not heard about the biopsies in 3 weeks.    SIGNATURES/CONFIDENTIALITY: You and/or your care partner have signed paperwork which will be entered into your electronic medical record.  These signatures attest to the fact that that the information above on your After Visit Summary has been reviewed   and is understood.  Full responsibility of the confidentiality of this discharge information lies with you and/or your care-partner. 

## 2015-06-13 ENCOUNTER — Telehealth: Payer: Self-pay | Admitting: Emergency Medicine

## 2015-06-13 NOTE — Telephone Encounter (Signed)
  Follow up Call-  Call back number 06/12/2015  Post procedure Call Back phone  # [phone]  Permission to leave phone message Yes     Patient questions:  Do you have a fever, pain , or abdominal swelling? No. Pain Score  0 *  Have you tolerated food without any problems? Yes.    Have you been able to return to your normal activities? Yes.    Do you have any questions about your discharge instructions: Diet   No. Medications  No. Follow up visit  No.  Do you have questions or concerns about your Care? No.  Actions: * If pain score is 4 or above: No action needed, pain <4.

## 2015-06-20 ENCOUNTER — Other Ambulatory Visit: Payer: Self-pay | Admitting: *Deleted

## 2015-06-20 MED ORDER — FLUTICASONE PROPIONATE HFA 220 MCG/ACT IN AERO: INHALATION_SPRAY | RESPIRATORY_TRACT | Status: DC

## 2015-06-20 MED FILL — FLOVENT HFA 220 MCG INHALER: 220 | 30 days supply | Qty: 12 | Fill #0

## 2015-06-21 ENCOUNTER — Encounter: Payer: Self-pay | Admitting: Gastroenterology

## 2015-07-06 DIAGNOSIS — T781XXA Other adverse food reactions, not elsewhere classified, initial encounter: Secondary | ICD-10-CM | POA: Diagnosis not present

## 2015-07-06 DIAGNOSIS — J3089 Other allergic rhinitis: Secondary | ICD-10-CM | POA: Diagnosis not present

## 2015-07-06 DIAGNOSIS — Z91011 Allergy to milk products: Secondary | ICD-10-CM | POA: Diagnosis not present

## 2015-07-06 DIAGNOSIS — J301 Allergic rhinitis due to pollen: Secondary | ICD-10-CM | POA: Diagnosis not present

## 2015-07-11 ENCOUNTER — Telehealth: Payer: Self-pay | Admitting: Gastroenterology

## 2015-07-11 NOTE — Telephone Encounter (Signed)
Records received from Dr. Donneta Romberg of Allergy - the patient has tested positive for food allergies to milk, tomato, squash, and almonds, which may be triggering the EoE. I will see him in clinic for reassessment.

## 2015-07-16 MED FILL — ESOMEPRAZOLE MAG DR 40 MG C: 40 | 90 days supply | Qty: 90 | Fill #2

## 2015-08-08 ENCOUNTER — Ambulatory Visit (INDEPENDENT_AMBULATORY_CARE_PROVIDER_SITE_OTHER): Payer: 59 | Admitting: Gastroenterology

## 2015-08-08 ENCOUNTER — Encounter: Payer: Self-pay | Admitting: Gastroenterology

## 2015-08-08 VITALS — BP 104/70 | HR 64 | Ht 71.0 in | Wt 178.4 lb

## 2015-08-08 DIAGNOSIS — Z8249 Family history of ischemic heart disease and other diseases of the circulatory system: Secondary | ICD-10-CM | POA: Diagnosis not present

## 2015-08-08 DIAGNOSIS — K200 Eosinophilic esophagitis: Secondary | ICD-10-CM | POA: Diagnosis not present

## 2015-08-08 DIAGNOSIS — R0789 Other chest pain: Secondary | ICD-10-CM

## 2015-08-08 MED FILL — FLOVENT HFA 220 MCG INHALER: 220 | 30 days supply | Qty: 12 | Fill #1

## 2015-08-08 NOTE — Patient Instructions (Signed)
We have referred you to [facility] to see Dr Dr Jenkins Rouge on 08/14/2015 at 8 am

## 2015-08-08 NOTE — Progress Notes (Signed)
HPI :  INTAKE VISIT 45 y/o male with a reported history of suspected IBS, here for consultation for further evaluation for this issue and chest discomfort.   He has roughly 2 BMs in the AM, and multuple BMS in the afternoon. He can have days of where he is home due to feeling poorly, speciifallly over the holidays. He thinks perhaps 4 BMs per day on avarage. He reports his stool varies between normal form and loose. No blood in the stools. He has increased gas and bloating, which can cause some discomfort. He has not taken hyocyamine in a long time. No weight loss. After he has a bowel movement he has some relief of his symptoms. He has some periodic burning sensation in his RUQ, was not sure if it was musculoskeletal. No vomiting routinely. He thinks these symptoms have been ongoing for a long time, he has been told he has IBS for the majority of his life. He has never had a prior colonoscopy. Grandmother had either colon or stomach cancer, he is not sure. No first degree relatives with CRC.  He also has intermittent discomfort in his chest, he thinks ongoing for a year or so. He has not had dysphagia for a few years, but he has had it in the past which used to bother him significantly. No odynophagia. He does have some pyrosis at times. He has occasional discomfort in his substernal area. He has had this over the holidays, but overall he has it once per month. He thinks it will last for a day or so at a time. No exertional component, it bothers him sporadically. No shortnesso f breath. He is part of a running group and exercises routinely without difficulty. He is taking nexium once per day at baseline, although he is trying to back off due to fear of it causing "dementia". He is taking it every M/W/F due to fear of side effects. If he does not take it, he will notice heartburn recur. He is not sure if he has had a prior food impaction although this has been noted in his chart. He endorses history of  dysphagia to meats in the past. He had an EGD in 2012 showing an esophageal stricture and elevated Eos to 37/HPF.  He reports several members if his family have digestive issues, one of which he thinks has celiac or food intolerances   EGD 01/2011 - distal esophageal stricture, dilated with 69mm Savory, increased Eos to 37 / HPF EGD 06/1999 - normal EGD  SINCE LAST VISIT:  history of intermittent chest pain with peripheral eosinophilia and remote history of food impaction / dysphagia who had an EGD in December to evaluate these symptoms while on PPI. There was subtle trachealization and furrowing of the esophagus, without stenosis, with the remainder of the exam being normal. Biopsies show increased eosinophils of the esophagus which could be consistent with EoE. Recommended a trial of oral flovent BID.I also referred him to the allergist for food allergy testing. Records received from Dr. Donneta Romberg of Allergy - the patient has tested positive for food allergies to milk, tomato, squash, and almonds. He has been trying to minimize intake of known allergens but does endorse intake of milk at times and other allergens.   He had been on oral flovent 213mcg, over a month, 2 puffs BID. He tolerates it okay. He is not sure if he has noted a difference since starting the flovent. He is generally swallowing okay without problems. His main  symptom has been chest discomfort which occurs about twice per week, now occuring with hand tingling which he previously did not have. Father had a heart attack in his age 8s, brother may have had a heart problems, but not sure of a heart attack. Chest discomfort in in the right chest and sternum area. This endorses pressure type symptoms, aching, which lasts 1-2 hours at a time. He generally does not think this is related to exertional activities, but sometimes he can notice it. He has never had prior shortness of breath. No prior stress testing. He does not think it is related to  eating. He runs 3-6 miles at a time without limitations. He denies palpitations. No radiation of symptoms down the arm or neck. He experiences tingling sensation in his fingers when he feels the chest discomfort.      Past Medical History  Diagnosis Date  . Irritable bowel syndrome   . GERD (gastroesophageal reflux disease)   . History of shingles   . Allergy   . Dysphagia      Past Surgical History  Procedure Laterality Date  . Lasix surgery    . Eye surgery     Family History  Problem Relation Age of Onset  . Depression Father   . Heart disease Father     some coronary issues  . Colon cancer Paternal Grandmother   . Diabetes Mother     adult onset  . Diabetes Brother   . Aneurysm Father     kidney   Social History  Substance Use Topics  . Smoking status: Former Research scientist (life sciences)  . Smokeless tobacco: Never Used  . Alcohol Use: Yes     Comment: 2 drinks daily - 2 beers max   Current Outpatient Prescriptions  Medication Sig Dispense Refill  . esomeprazole (NEXIUM) 40 MG capsule Take 1 capsule (40 mg total) by mouth daily. 90 capsule 3  . famotidine (PEPCID) 20 MG tablet Take 20 mg by mouth as needed for heartburn or indigestion.    . fluticasone (FLOVENT HFA) 220 MCG/ACT inhaler Flovent 220 mcg inhaler, two sprays twice daily. Fluticasone is administered using a metered dose inhaler without a spacer. The medication is sprayed into the patient's mouth and then swallowed. Patients should not use a spacer or inhale while the medication is being delivered, and they should not eat or drink for 30 minutes following administration. 1 Inhaler 1  . hyoscyamine (LEVSIN, ANASPAZ) 0.125 MG tablet Take 1 tablet (0.125 mg total) by mouth every 4 (four) hours as needed for cramping. 30 tablet 3  . ranitidine (ZANTAC) 150 MG capsule Take 150 mg by mouth 2 (two) times daily.     No current facility-administered medications for this visit.   No Known Allergies   Review of Systems: All systems  reviewed and negative except where noted in HPI.   Lab Results  Component Value Date   WBC 4.1 10/16/2014   HGB 14.5 10/16/2014   HCT 41.0 10/16/2014   MCV 88.2 10/16/2014   PLT 259 10/16/2014    Lab Results  Component Value Date   ALT 13 10/16/2014   AST 19 10/16/2014   ALKPHOS 46 10/16/2014   BILITOT 0.6 10/16/2014     Physical Exam: BP 104/70 mmHg  Pulse 64  Ht 5\' 11"  (1.803 m)  Wt 178 lb 6.4 oz (80.922 kg)  BMI 24.89 kg/m2 Constitutional: Pleasant,well-developed, male in no acute distress. HEENT: Normocephalic and atraumatic. Conjunctivae are normal. No scleral icterus. Neck supple.  Cardiovascular: Normal rate, regular rhythm.  Pulmonary/chest: Effort normal and breath sounds normal. No wheezing, rales or rhonchi. Abdominal: Soft, nondistended, nontender. Bowel sounds active throughout. There are no masses palpable. No hepatomegaly. Extremities: no edema Lymphadenopathy: No cervical adenopathy noted. Neurological: Alert and oriented to person place and time. Skin: Skin is warm and dry. No rashes noted. Psychiatric: Normal mood and affect. Behavior is normal.   ASSESSMENT AND PLAN: 45 y/o male with a history of GERD and dysphagia with prior esophageal stricture, with EGD with biopsies that could be consistent with eosinophilic esophagitis, presenting with intermittent chest pain as outlined above for the past year. He also has had a peripheral eosinophilia. His constellation of findings is consistent with eosinophilic esophagitis. Unfortunately despite treatment with PPI and flovent he has not had much benefit to date. He does have multiple food allergies and trying to avoid them, but does endorse some milk intake. I do think his symptoms are more likely related to EoE, however he has a family history of heart disease and now "hang tingling" associated with his discomfort, which has recently occurred with exertion, I offered him cardiac evaluation which he wanted to  proceed with, to ensure normal. Once this is done, if normal, we may consider a repeat EGD on flovent and if he can comply more with avoiding known allergens. If the EoE remains active with ongoing symptoms, we will need to treat this more aggressively. He agreed with the plan.   [place] Cellar, MD [facility] Pager [phone]

## 2015-08-14 ENCOUNTER — Ambulatory Visit: Payer: 59 | Admitting: Cardiovascular Disease

## 2015-08-31 ENCOUNTER — Ambulatory Visit (INDEPENDENT_AMBULATORY_CARE_PROVIDER_SITE_OTHER): Payer: 59 | Admitting: Cardiovascular Disease

## 2015-08-31 ENCOUNTER — Encounter: Payer: Self-pay | Admitting: Cardiovascular Disease

## 2015-08-31 VITALS — BP 134/70 | HR 52 | Ht 71.0 in | Wt 181.8 lb

## 2015-08-31 DIAGNOSIS — R079 Chest pain, unspecified: Secondary | ICD-10-CM | POA: Diagnosis not present

## 2015-08-31 LAB — BASIC METABOLIC PANEL
BUN: 11 mg/dL (ref 7–25)
CALCIUM: 9.3 mg/dL (ref 8.6–10.3)
CO2: 27 mmol/L (ref 20–31)
CREATININE: 1.02 mg/dL (ref 0.60–1.35)
Chloride: 102 mmol/L (ref 98–110)
Glucose, Bld: 120 mg/dL — ABNORMAL HIGH (ref 65–99)
Potassium: 4.3 mmol/L (ref 3.5–5.3)
Sodium: 138 mmol/L (ref 135–146)

## 2015-08-31 NOTE — Patient Instructions (Addendum)
Medication Instructions:  Your physician recommends that you continue on your current medications as directed. Please refer to the Current Medication list given to you today.  Labwork: Your physician recommends that you have lab work today. BMET   Testing/Procedures: Your physician has requested that you have cardiac CT. Cardiac computed tomography (CT) is a painless test that uses an x-ray machine to take clear, detailed pictures of your heart. For further information please visit [employer]. Please follow instruction sheet as given.  Follow-Up: Your physician wants you to follow-up in: 12 months with Dr. Johnsie Cancel. You will receive a reminder letter in the mail two months in advance. If you don't receive a letter, please call our office to schedule the follow-up appointment.   If you need a refill on your cardiac medications before your next appointment, please call your pharmacy.

## 2015-08-31 NOTE — Progress Notes (Signed)
Patient ID: Darren Best, male   DOB: [birth year], 45 y.o.   MRN: [id number]     Cardiology Office Note   Date:  08/31/2015   ID:  Darren Best, DOB Mar 23, 1971, MRN [id number]  PCP:  Darren Agreste, MD  Cardiologist:   Jenkins Rouge, MD   No chief complaint on file.     History of Present Illness: Darren Best is a 45 y.o. male who presents for evaluation of chest pain  Seen by GI recently for IBS.  Intermittent discomfort in his chest, he thinks ongoing for a year or so. He has not had dysphagia for a few years, but he has had it in the past which used to bother him significantly. No odynophagia. He does have some pyrosis at times. He has occasional discomfort in his substernal area. He has had this over the holidays, but overall he has it once per month. He thinks it will last for a day or so at a time. No exertional component, it bothers him sporadically. No shortnesso f breath. He is part of a running group and exercises routinely without difficulty. He is taking nexium once per day at baseline, although he is trying to back off due to fear of it causing "dementia". He is taking it every M/W/F due to fear of side effects. If he does not take it, he will notice heartburn recur. He is not sure if he has had a prior food impaction although this has been noted in his chart. He endorses history of dysphagia to meats in the past. He had an EGD in 2012 showing an esophageal stricture and elevated Eos to 37/HPF.  GI felt symptoms compatible with eosinophilic esophagitis but concerned to r/o CAD.    In office today ECG with T wave inversions V1-3 new since 2014 discussed this with patient  Options including cardiac CTA and stress testing discussed Prefer the former     Past Medical History  Diagnosis Date  . Irritable bowel syndrome   . GERD (gastroesophageal reflux disease)   . History of shingles   . Allergy   . Dysphagia     Past Surgical History  Procedure Laterality Date  . Lasix  surgery    . Eye surgery       Current Outpatient Prescriptions  Medication Sig Dispense Refill  . esomeprazole (NEXIUM) 40 MG capsule Take 40 mg by mouth every other day.    . famotidine (PEPCID) 20 MG tablet Take 20 mg by mouth daily as needed for heartburn or indigestion.     . fluticasone (FLOVENT HFA) 220 MCG/ACT inhaler Flovent 220 mcg inhaler, two sprays twice daily. Fluticasone is administered using a metered dose inhaler without a spacer. The medication is sprayed into the patient's mouth and then swallowed. Patients should not use a spacer or inhale while the medication is being delivered, and they should not eat or drink for 30 minutes following administration. 1 Inhaler 1  . hyoscyamine (LEVSIN, ANASPAZ) 0.125 MG tablet Take 1 tablet (0.125 mg total) by mouth every 4 (four) hours as needed for cramping. 30 tablet 3  . ranitidine (ZANTAC) 150 MG capsule Take 150 mg by mouth 2 (two) times daily as needed for heartburn.      No current facility-administered medications for this visit.    Allergies:   Review of patient's allergies indicates no known allergies.    Social History:  The patient  reports that he has quit smoking. He has never used smokeless  tobacco. He reports that he drinks alcohol. He reports that he does not use illicit drugs.   Family History:  The patient's family history includes Aneurysm in his father; Colon cancer in his paternal grandmother; Depression in his father; Diabetes in his brother and mother; Heart disease in his father.    ROS:  Please see the history of present illness.   Otherwise, review of systems are positive for none.   All other systems are reviewed and negative.    PHYSICAL EXAM: VS:  BP 134/70 mmHg  Pulse 52  Ht 5\' 11"  (1.803 m)  Wt 82.464 kg (181 lb 12.8 oz)  BMI 25.37 kg/m2 , BMI Body mass index is 25.37 kg/(m^2). Affect appropriate Healthy:  appears stated age 77: normal Neck supple with no adenopathy JVP normal no bruits no  thyromegaly Lungs clear with no wheezing and good diaphragmatic motion Heart:  S1/S2 no murmur, no rub, gallop or click PMI normal Abdomen: benighn, BS positve, no tenderness, no AAA no bruit.  No HSM or HJR Distal pulses intact with no bruits No edema Neuro non-focal Skin warm and dry No muscular weakness    EKG:   06/28/12  SR rate 55 normal  08/31/15  SR rate 52 T wave inversions V1-3    Recent Labs: 10/16/2014: ALT 13; BUN 14; Creat 0.79; Hemoglobin 14.5; Platelets 259; Potassium 4.3; Sodium 140; TSH 1.837    Lipid Panel    Component Value Date/Time   CHOL 201* 10/16/2014 1605   TRIG 85 10/16/2014 1605   HDL 55 10/16/2014 1605   CHOLHDL 3.7 10/16/2014 1605   VLDL 17 10/16/2014 1605   LDLCALC 129* 10/16/2014 1605      Wt Readings from Last 3 Encounters:  08/31/15 82.464 kg (181 lb 12.8 oz)  08/08/15 80.922 kg (178 lb 6.4 oz)  06/12/15 83.462 kg (184 lb)      Other studies Reviewed: Additional studies/ records that were reviewed today include: Epic notes ECG GI notes and labs .    ASSESSMENT AND PLAN:  1.  Chest Pain: recurrent with new ECG changes in anterior leads.  BMET today and will schedule cardiac CTA with calcium scoring hopefully next week 2. GI  IBS and ? Eosinophilic esophagitis dietary chages zantac levsin and prilosec f/u gastro possible repeat EGD and follow peripheral eosinophil count 3. Chol:  LDL 129 diet Rx if calcium score is low or 0    Current medicines are reviewed at length with the patient today.  The patient does not have concerns regarding medicines.  The following changes have been made:  no change  Labs/ tests ordered today include: BMET Cardiac CTA   No orders of the defined types were placed in this encounter.     Disposition:   FU with me in a year      Signed, Jenkins Rouge, MD  08/31/2015 10:19 AM    [facility] [address], [place], [place]  [address] Phone: [phone]; Fax: [phone]

## 2015-09-06 ENCOUNTER — Encounter (HOSPITAL_COMMUNITY): Payer: Self-pay

## 2015-09-06 ENCOUNTER — Ambulatory Visit (HOSPITAL_COMMUNITY)
Admission: RE | Admit: 2015-09-06 | Discharge: 2015-09-06 | Disposition: A | Discharge status: Home / Self Care | Payer: 59 | Source: Ambulatory Visit | Attending: Cardiovascular Disease | Admitting: Cardiovascular Disease

## 2015-09-06 DIAGNOSIS — R079 Chest pain, unspecified: Secondary | ICD-10-CM | POA: Insufficient documentation

## 2015-09-06 MED ORDER — NITROGLYCERIN 0.4 MG SL SUBL
0.8000 mg | SUBLINGUAL_TABLET | Freq: Once | SUBLINGUAL | Status: AC
  Administered 2015-09-06: 0.8 mg via SUBLINGUAL

## 2015-09-06 MED ORDER — METOPROLOL TARTRATE 5 MG/5ML IV SOLN
INTRAVENOUS | Status: AC
  Filled 2015-09-06: qty 10

## 2015-09-06 MED ORDER — METOPROLOL TARTRATE 5 MG/5ML IV SOLN
5.0000 mg | INTRAVENOUS | Status: DC | PRN
  Administered 2015-09-06: 5 mg via INTRAVENOUS

## 2015-09-06 MED ORDER — NITROGLYCERIN 0.4 MG SL SUBL
SUBLINGUAL_TABLET | SUBLINGUAL | Status: AC
  Filled 2015-09-06: qty 2

## 2015-09-06 MED ORDER — IOPAMIDOL (ISOVUE-370) INJECTION 76%
INTRAVENOUS | Status: AC
  Administered 2015-09-06: 100 mL
  Filled 2015-09-06: qty 100

## 2015-09-06 NOTE — Progress Notes (Signed)
CT scan completed. Tolerated well. D/C home walking with wife. Awake and alert. In no distress. 

## 2015-09-13 ENCOUNTER — Encounter: Payer: Self-pay | Admitting: Family Medicine

## 2015-10-18 ENCOUNTER — Encounter: Payer: Self-pay | Admitting: Family Medicine

## 2015-10-18 ENCOUNTER — Ambulatory Visit (INDEPENDENT_AMBULATORY_CARE_PROVIDER_SITE_OTHER): Payer: 59 | Admitting: Family Medicine

## 2015-10-18 VITALS — BP 112/70 | HR 50 | Temp 98.0°F | Resp 15 | Ht 71.0 in | Wt 182.8 lb

## 2015-10-18 DIAGNOSIS — Z0000 Encounter for general adult medical examination without abnormal findings: Secondary | ICD-10-CM

## 2015-10-18 DIAGNOSIS — Z13220 Encounter for screening for lipoid disorders: Secondary | ICD-10-CM | POA: Diagnosis not present

## 2015-10-18 DIAGNOSIS — Z131 Encounter for screening for diabetes mellitus: Secondary | ICD-10-CM | POA: Diagnosis not present

## 2015-10-18 DIAGNOSIS — R079 Chest pain, unspecified: Secondary | ICD-10-CM

## 2015-10-18 LAB — COMPLETE METABOLIC PANEL WITH GFR
ALBUMIN: 4.3 g/dL (ref 3.6–5.1)
ALK PHOS: 51 U/L (ref 40–115)
ALT: 16 U/L (ref 9–46)
AST: 24 U/L (ref 10–40)
BUN: 12 mg/dL (ref 7–25)
CALCIUM: 9.2 mg/dL (ref 8.6–10.3)
CO2: 28 mmol/L (ref 20–31)
CREATININE: 0.94 mg/dL (ref 0.60–1.35)
Chloride: 103 mmol/L (ref 98–110)
GFR, Est Non African American: >89 mL/min (ref >=60)
Glucose, Bld: 101 mg/dL — ABNORMAL HIGH (ref 65–99)
Potassium: 4.7 mmol/L (ref 3.5–5.3)
Sodium: 140 mmol/L (ref 135–146)
Total Bilirubin: 0.5 mg/dL (ref 0.2–1.2)
Total Protein: 6.9 g/dL (ref 6.1–8.1)

## 2015-10-18 LAB — LIPID PANEL
CHOLESTEROL: 191 mg/dL (ref 125–200)
HDL: 74 mg/dL (ref >=40)
LDL Cholesterol: 105 mg/dL (ref <130)
Total CHOL/HDL Ratio: 2.6 Ratio (ref <=5.0)
Triglycerides: 58 mg/dL (ref <150)
VLDL: 12 mg/dL (ref <30)

## 2015-10-18 NOTE — Patient Instructions (Addendum)
IF you received an x-ray today, you will receive an invoice from [facility]. Please contact [facility] at [phone] with questions or concerns regarding your invoice.   IF you received labwork today, you will receive an invoice from [employer]. Please contact [facility] at [phone] with questions or concerns regarding your invoice.   Our billing staff will not be able to assist you with questions regarding bills from these companies.  You will be contacted with the lab results as soon as they are available. The fastest way to get your results is to activate your My Chart account. Instructions are located on the last page of this paperwork. If you have not heard from [place] regarding the results in 2 weeks, please contact this office.    If chest pain or left arm symptoms return, we can look into other causes, including possible source from your neck or neurology evaluation. Return sooner if worsening symptoms. I will let you know about the lab results as discussed above, and this should be coming through [facility]. Good luck with the 2017 rollout!  Keeping you healthy  Get these tests  Blood pressure- Have your blood pressure checked once a year by your healthcare provider.  Normal blood pressure is 120/80.  Weight- Have your body mass index (BMI) calculated to screen for obesity.  BMI is a measure of body fat based on height and weight. You can also calculate your own BMI at GravelBags.it.  Cholesterol- Have your cholesterol checked regularly starting at age 80, sooner may be necessary if you have diabetes, high blood pressure, if a family member developed heart diseases at an early age or if you smoke.   Chlamydia, HIV, and other sexual transmitted disease- Get screened each year until the age of 82 then within three months of each new sexual partner.  Diabetes- Have your blood sugar checked regularly if you have high blood  pressure, high cholesterol, a family history of diabetes or if you are overweight.  Get these vaccines  Flu shot- Every fall.  Tetanus shot- Every 10 years.  Menactra- Single dose; prevents meningitis.  Take these steps  Don't smoke- If you do smoke, ask your healthcare provider about quitting. For tips on how to quit, go to www.smokefree.gov or call 1-800-QUIT-NOW.  Be physically active- Exercise 5 days a week for at least 30 minutes.  If you are not already physically active start slow and gradually work up to 30 minutes of moderate physical activity.  Examples of moderate activity include walking briskly, mowing the yard, dancing, swimming bicycling, etc.  Eat a healthy diet- Eat a variety of healthy foods such as fruits, vegetables, low fat milk, low fat cheese, yogurt, lean meats, poultry, fish, beans, tofu, etc.  For more information on healthy eating, go to www.thenutritionsource.org  Drink alcohol in moderation- Limit alcohol intake two drinks or less a day.  Never drink and drive.  Dentist- Brush and floss teeth twice daily; visit your dentis twice a year.  Depression-Your emotional health is as important as your physical health.  If you're feeling down, losing interest in things you normally enjoy please talk with your healthcare provider.  Gun Safety- If you keep a gun in your home, keep it unloaded and with the safety lock on.  Bullets should be stored separately.  Helmet use- Always wear a helmet when riding a motorcycle, bicycle, rollerblading or skateboarding.  Safe sex- If you may be exposed to a sexually transmitted infection,  use a condom  Seat belts- Seat bels can save your life; always wear one.  Smoke/Carbon Monoxide detectors- These detectors need to be installed on the appropriate level of your home.  Replace batteries at least once a year.  Skin Cancer- When out in the sun, cover up and use sunscreen SPF 15 or higher.  Violence- If anyone is threatening or  hurting you, please tell your healthcare provider.

## 2015-10-18 NOTE — Progress Notes (Signed)
By signing my name below, I, [name], attest that this documentation has been prepared under the direction and in the presence of [name], MD.  Electronically Signed: [name], Medical Scribe. 10/18/2015. 11:09 AM.  Subjective:    Patient ID: Darren Best, male    DOB: Jun 19, 1970, 45 y.o.   MRN: [id number]  HPI Chief Complaint  Patient presents with  . Annual Exam    HPI Comments: Darren Best is a 45 y.o. male with a PMHx of eosinophilic esophagitis, and IBS who presents to the [facility] for a complete physical exam. Pt was sent to an allergist after being dx with eosinophilic esophagitis, and was told he had a milk allergy. Pt has cut milk out of his diet since.  Eosinophilic esophagitis: GI doctor is Dr. Havery Moros.  Cancer Screening: No FHx of cancer.  Cardio: Mom and brother has DM. Heart disease in dad- stroke; of note he did see cardiologist May 6th for chest pain evaluation. He did have a new T wave inversion V1-3 new since 2014. He had coronary calcium scoring performed on June 1st with a score off 0- nl. Pt states his chest pain has overall improved. Pt occasionally experiences numbness and tingling in his hands. Pt occasionally has neck pain and stiff neck. Pt has stiff neck this week. Pt thinks his stiff neck is due to spending a lot of time with his head down from changing 3 flat tires on his recent trip.  Immunizations: Immunization History  Administered Date(s) Administered  . Influenza, Seasonal, Injecte, Preservative Fre 01/01/2015  . Tdap 04/26/2012   Exercise: Pt runs and bikes. Pt rides his bike both on and off road.  Dentist: Pt has seen his dentist in the last month.  Vision: Pt has seen his ophthalmologist in the last year. Pt had lasik eye surgery a few years back- reguarly has checkups.  Visual Acuity Screening   Right eye Left eye Both eyes  Without correction: 20/25 20/20 20/20   With correction:       Depression Screening: Pt has experienced anxiety and decreased concentration. Pt thinks it's due to his concerns with his heart and recent dx. Depression screen [facility] 2/9 10/18/2015 10/16/2014 10/10/2013  Decreased Interest 0 0 0  Down, Depressed, Hopeless 0 0 0  PHQ - 2 Score 0 0 0    Patient Active Problem List   Diagnosis Date Noted  . Eosinophilic esophagitis [id number]  . IBS (irritable bowel syndrome) 05/15/2015  . Cyst, eyelid sebaceous 03/30/2013   Past Medical History  Diagnosis Date  . Irritable bowel syndrome   . GERD (gastroesophageal reflux disease)   . History of shingles   . Allergy   . Dysphagia    Past Surgical History  Procedure Laterality Date  . Lasix surgery    . Eye surgery     Allergies  Allergen Reactions  . Almond Oil Bitter Flavor [Flavoring Agent]   . Milk-Related Compounds    Prior to Admission medications   Medication Sig Start Date End Date Taking? Authorizing Provider  esomeprazole (NEXIUM) 40 MG capsule Take 40 mg by mouth every other day.   Yes Historical Provider, MD  famotidine (PEPCID) 20 MG tablet Take 20 mg by mouth daily as needed for heartburn or indigestion.    Yes Historical Provider, MD  hyoscyamine (LEVSIN, ANASPAZ) 0.125 MG tablet Take 1 tablet (0.125 mg total) by mouth every 4 (four) hours as needed for cramping. 05/15/15  Yes Renelda Loma  Armbruster, MD  ranitidine (ZANTAC) 150 MG capsule Take 150 mg by mouth 2 (two) times daily as needed for heartburn.    Yes Historical Provider, MD   Social History   Social History  . Marital Status: Married    Spouse Name: N/A  . Number of Children: 0  . Years of Education: N/A   Occupational History  . [employer]    IT/ [employer]   Social History Main Topics  . Smoking status: Former Research scientist (life sciences)  . Smokeless tobacco: Never Used  . Alcohol Use: Yes     Comment: 2 drinks daily - 2 beers max  . Drug Use: No  . Sexual Activity: Yes     Comment: number of sex partners in the last 94  months 1   Other Topics Concern  . Not on file   Social History Narrative   College degree   [place] [employer]   2-3 caffeine drinks daily   Exercise cycling and gym classes 2-3 times per week for 1-2 hours    Review of Systems  Musculoskeletal: Positive for neck pain and neck stiffness.  Allergic/Immunologic: Positive for environmental allergies and food allergies.  Neurological: Positive for numbness.  Psychiatric/Behavioral: Positive for decreased concentration. The patient is nervous/anxious.   13 point ROS positive for food and seasonal allergies, otherwise normal.  Objective:  BP 112/70 mmHg  Pulse 50  Temp(Src) 98 F (36.7 C) (Oral)  Resp 15  Ht 5\' 11"  (1.803 m)  Wt 182 lb 12.8 oz (82.918 kg)  BMI 25.51 kg/m2  SpO2 99%  Physical Exam  Constitutional: He is oriented to person, place, and time. He appears well-developed and well-nourished.  HENT:  Head: Normocephalic and atraumatic.  Right Ear: External ear normal.  Left Ear: External ear normal.  Mouth/Throat: Oropharynx is clear and moist.  Eyes: Conjunctivae and EOM are normal. Pupils are equal, round, and reactive to light.  Neck: Normal range of motion. Neck supple. No thyromegaly present.  Cardiovascular: Normal rate, regular rhythm, normal heart sounds and intact distal pulses.   Pulmonary/Chest: Effort normal and breath sounds normal. No respiratory distress. He has no wheezes.  Abdominal: Soft. He exhibits no distension. There is no tenderness.  Musculoskeletal: Normal range of motion. He exhibits no edema or tenderness.  Lymphadenopathy:    He has no cervical adenopathy.  Neurological: He is alert and oriented to person, place, and time. He has normal reflexes.  Skin: Skin is warm and dry.  Psychiatric: He has a normal mood and affect. His behavior is normal.  Vitals reviewed.   Assessment & Plan:   HILARY MURDICK is a 45 y.o. male Annual physical exam  - -anticipatory guidance as below in AVS, screening  labs above. Health maintenance items as above in HPI discussed/recommended as applicable.   Screening for diabetes mellitus - Plan: COMPLETE METABOLIC PANEL WITH GFR  Screening for hyperlipidemia - Plan: Lipid panel  Chest pain, unspecified chest pain type  -Now improved. Possible cervical spine/degenerative disc disease source. RTC precautions if recurrent, or ER precautions if worsening.  No orders of the defined types were placed in this encounter.   Patient Instructions       IF you received an x-ray today, you will receive an invoice from [facility]. Please contact [facility] at [phone] with questions or concerns regarding your invoice.   IF you received labwork today, you will receive an invoice from [employer]. Please contact [facility] at [phone] with questions  or concerns regarding your invoice.   Our billing staff will not be able to assist you with questions regarding bills from these companies.  You will be contacted with the lab results as soon as they are available. The fastest way to get your results is to activate your My Chart account. Instructions are located on the last page of this paperwork. If you have not heard from [place] regarding the results in 2 weeks, please contact this office.    If chest pain or left arm symptoms return, we can look into other causes, including possible source from your neck or neurology evaluation. Return sooner if worsening symptoms. I will let you know about the lab results as discussed above, and this should be coming through [place]. Good luck with the 2017 rollout!  Keeping you healthy  Get these tests  Blood pressure- Have your blood pressure checked once a year by your healthcare provider.  Normal blood pressure is 120/80.  Weight- Have your body mass index (BMI) calculated to screen for obesity.  BMI is a measure of body fat based on height and weight. You can also calculate  your own BMI at [employer]velBags.it.  Cholesterol- Have your cholesterol checked regularly starting at age 62, sooner may be necessary if you have diabetes, high blood pressure, if a family member developed heart diseases at an early age or if you smoke.   Chlamydia, HIV, and other sexual transmitted disease- Get screened each year until the age of 37 then within three months of each new sexual partner.  Diabetes- Have your blood sugar checked regularly if you have high blood pressure, high cholesterol, a family history of diabetes or if you are overweight.  Get these vaccines  Flu shot- Every fall.  Tetanus shot- Every 10 years.  Menactra- Single dose; prevents meningitis.  Take these steps  Don't smoke- If you do smoke, ask your healthcare provider about quitting. For tips on how to quit, go to www.smokefree.gov or call 1-800-QUIT-NOW.  Be physically active- Exercise 5 days a week for at least 30 minutes.  If you are not already physically active start slow and gradually work up to 30 minutes of moderate physical activity.  Examples of moderate activity include walking briskly, mowing the yard, dancing, swimming bicycling, etc.  Eat a healthy diet- Eat a variety of healthy foods such as fruits, vegetables, low fat milk, low fat cheese, yogurt, lean meats, poultry, fish, beans, tofu, etc.  For more information on healthy eating, go to www.thenutritionsource.org  Drink alcohol in moderation- Limit alcohol intake two drinks or less a day.  Never drink and drive.  Dentist- Brush and floss teeth twice daily; visit your dentis twice a year.  Depression-Your emotional health is as important as your physical health.  If you're feeling down, losing interest in things you normally enjoy please talk with your healthcare provider.  Gun Safety- If you keep a gun in your home, keep it unloaded and with the safety lock on.  Bullets should be stored separately.  Helmet use- Always wear a  helmet when riding a motorcycle, bicycle, rollerblading or skateboarding.  Safe sex- If you may be exposed to a sexually transmitted infection, use a condom  Seat belts- Seat bels can save your life; always wear one.  Smoke/Carbon Monoxide detectors- These detectors need to be installed on the appropriate level of your home.  Replace batteries at least once a year.  Skin Cancer- When out in the sun, cover up and use sunscreen  SPF 60 or higher.  Violence- If anyone is threatening or hurting you, please tell your healthcare provider.    I personally performed the services described in this documentation, which was scribed in my presence. The recorded information has been reviewed and considered, and addended by me as needed.   Signed,   [name], MD [facility].  10/20/2015 9:49 PM

## 2015-11-01 ENCOUNTER — Encounter: Payer: Self-pay | Admitting: Gastroenterology

## 2015-11-01 ENCOUNTER — Other Ambulatory Visit: Payer: Self-pay | Admitting: Gastroenterology

## 2015-11-01 DIAGNOSIS — K219 Gastro-esophageal reflux disease without esophagitis: Secondary | ICD-10-CM

## 2015-11-01 MED ORDER — ESOMEPRAZOLE MAGNESIUM 40 MG PO CPDR: 40.0000 mg | DELAYED_RELEASE_CAPSULE | Freq: Every day | ORAL | 3 refills | Status: DC

## 2015-11-01 MED FILL — ESOMEPRAZOLE MAG DR 40 MG C: 40 | 90 days supply | Qty: 90 | Fill #0

## 2016-02-11 MED FILL — ESOMEPRAZOLE MAG DR 40 MG C: 40 | 90 days supply | Qty: 90 | Fill #1

## 2016-06-06 MED FILL — ESOMEPRAZOLE MAG DR 40 MG C: 40 | 90 days supply | Qty: 90 | Fill #2

## 2016-08-03 ENCOUNTER — Ambulatory Visit (INDEPENDENT_AMBULATORY_CARE_PROVIDER_SITE_OTHER): Payer: 59

## 2016-08-03 ENCOUNTER — Encounter (HOSPITAL_COMMUNITY): Payer: Self-pay | Admitting: *Deleted

## 2016-08-03 ENCOUNTER — Ambulatory Visit (HOSPITAL_COMMUNITY)
Admission: EM | Admit: 2016-08-03 | Discharge: 2016-08-03 | Disposition: A | Discharge status: Home / Self Care | Payer: 59 | Attending: Internal Medicine | Admitting: Internal Medicine

## 2016-08-03 DIAGNOSIS — R059 Cough, unspecified: Secondary | ICD-10-CM

## 2016-08-03 DIAGNOSIS — R0602 Shortness of breath: Secondary | ICD-10-CM

## 2016-08-03 DIAGNOSIS — J111 Influenza due to unidentified influenza virus with other respiratory manifestations: Secondary | ICD-10-CM

## 2016-08-03 DIAGNOSIS — R69 Illness, unspecified: Secondary | ICD-10-CM | POA: Diagnosis not present

## 2016-08-03 DIAGNOSIS — R05 Cough: Secondary | ICD-10-CM

## 2016-08-03 MED ORDER — ONDANSETRON HCL 4 MG PO TABS: 4.0000 mg | ORAL_TABLET | Freq: Three times a day (TID) | ORAL | 0 refills | Status: DC | PRN

## 2016-08-03 MED ORDER — OSELTAMIVIR PHOSPHATE 75 MG PO CAPS: 75.0000 mg | ORAL_CAPSULE | Freq: Two times a day (BID) | ORAL | 0 refills | Status: DC

## 2016-08-03 NOTE — ED Provider Notes (Signed)
CSN: [id number]     Arrival date & time 08/03/16  1226 History   First MD Initiated Contact with Patient 08/03/16 1356     Chief Complaint  Patient presents with  . Fever  . Cough   (Consider location/radiation/quality/duration/timing/severity/associated sxs/prior Treatment)  HPI   The patient is a 46 year old male presenting today with complaints of generalized malaise, fever, productive yellow cough since this past Thursday. Patient states he's been traveling out of town for work and flew back this Friday. Patient reports that he has had intermittent chills with fevers high as 101. Unknown sick contacts as he was a ta a conference. Reports positive nausea, negative vomiting, positive diarrhea. Productive yellowish cough. Denies abdominal pain, chest pain, chest pressure or shortness of breath.  Past Medical History:  Diagnosis Date  . Allergy   . Dysphagia   . GERD (gastroesophageal reflux disease)   . History of shingles   . Irritable bowel syndrome    Past Surgical History:  Procedure Laterality Date  . EYE SURGERY    . lasix surgery     Family History  Problem Relation Age of Onset  . Depression Father   . Heart disease Father     some coronary issues  . Aneurysm Father     kidney  . Colon cancer Paternal Grandmother   . Diabetes Mother     adult onset  . Diabetes Brother    Social History  Substance Use Topics  . Smoking status: Former Research scientist (life sciences)  . Smokeless tobacco: Never Used  . Alcohol use Yes     Comment: 2 drinks daily - 2 beers max    Review of Systems  Constitutional: Positive for chills, fatigue and fever.  HENT: Positive for congestion, sinus pain, sinus pressure and sore throat. Negative for ear pain, sneezing, trouble swallowing and voice change.   Eyes: Negative for pain, discharge, itching and visual disturbance.  Respiratory: Positive for cough. Negative for chest tightness, shortness of breath and wheezing.   Cardiovascular: Negative.  Negative  for chest pain and leg swelling.  Gastrointestinal: Positive for diarrhea and nausea. Negative for abdominal distention, abdominal pain and vomiting.  Endocrine: Negative.   Genitourinary: Negative.   Musculoskeletal: Negative.  Negative for myalgias and neck stiffness.  Skin: Negative.  Negative for rash.  Allergic/Immunologic: Negative.   Neurological: Positive for headaches.  Hematological: Negative.   Psychiatric/Behavioral: Negative.     Allergies  Almond oil bitter flavor [flavoring agent] and Milk-related compounds  Home Medications   Prior to Admission medications   Medication Sig Start Date End Date Taking? Authorizing Provider  famotidine (PEPCID) 20 MG tablet Take 20 mg by mouth daily as needed for heartburn or indigestion.    Yes Historical Provider, MD  ranitidine (ZANTAC) 150 MG capsule Take 150 mg by mouth 2 (two) times daily as needed for heartburn.    Yes Historical Provider, MD  esomeprazole (NEXIUM) 40 MG capsule Take 1 capsule (40 mg total) by mouth daily. 11/01/15   Manus Gunning, MD  hyoscyamine (LEVSIN, ANASPAZ) 0.125 MG tablet Take 1 tablet (0.125 mg total) by mouth every 4 (four) hours as needed for cramping. 05/15/15   Manus Gunning, MD  ondansetron (ZOFRAN) 4 MG tablet Take 1 tablet (4 mg total) by mouth every 8 (eight) hours as needed for nausea or vomiting. 08/03/16   [name], NP  oseltamivir (TAMIFLU) 75 MG capsule Take 1 capsule (75 mg total) by mouth every 12 (twelve) hours. 08/03/16  [name], NP   Meds Ordered and Administered this Visit  Medications - No data to display  BP (!) 135/91 (BP Location: Right Arm)   Pulse 69   Temp 98 F (36.7 C) (Oral)   Resp 18   SpO2 100%  No data found.   Physical Exam  Constitutional: He is oriented to person, place, and time. He appears well-developed and well-nourished. No distress.  HENT:  Bilateral tympanic membranes pearly gray in appearance with light reflexes present  and bony prominences visualized.  Small amount of redness noted in posterior oropharynx with mucoid discharge noted. Negative for exudate or patches.   Neck: Normal range of motion.  Negative nuchal rigidity.  Cardiovascular: Normal rate, regular rhythm, normal heart sounds and intact distal pulses.  Exam reveals no gallop and no friction rub.   No murmur heard. Pulmonary/Chest: Effort normal and breath sounds normal. No respiratory distress. He has no wheezes. He has no rales. He exhibits no tenderness.  No adventitious breath sounds noted.  Negative for E to A egophony.  Abdominal: Soft. He exhibits no distension. There is no tenderness.  Hyperactive bowel sounds noted in all fields  Lymphadenopathy:    He has no cervical adenopathy.  Neurological: He is alert and oriented to person, place, and time.  Skin: Skin is warm and dry. He is not diaphoretic.  Nursing note and vitals reviewed.   Urgent Care Course     Procedures (including critical care time)  Labs Review Labs Reviewed - No data to display  Imaging Review Dg Chest 2 View  Result Date: 08/03/2016 CLINICAL DATA:  Shortness of breath and cough.  Fever. EXAM: CHEST  2 VIEW COMPARISON:  None. FINDINGS: Lungs are clear. Heart size and pulmonary vascularity are normal. No adenopathy. No bone lesions. IMPRESSION: No edema or consolidation. Electronically Signed   By: Lowella Grip III M.D.   On: 08/03/2016 13:55    MDM   1. Cough   2. Influenza-like illness    Meds ordered this encounter  Medications  . ondansetron (ZOFRAN) 4 MG tablet    Sig: Take 1 tablet (4 mg total) by mouth every 8 (eight) hours as needed for nausea or vomiting.    Dispense:  12 tablet    Refill:  0  . oseltamivir (TAMIFLU) 75 MG capsule    Sig: Take 1 capsule (75 mg total) by mouth every 12 (twelve) hours.    Dispense:  10 capsule    Refill:  0    Discussed plan of care with patient. Patient would like to try Tamiflu course as he feel  this is "worse than a regular cold". Agrees to try sinus rinses for nasal congestion and Zofran for nausea. The usual and customary discharge instructions and warnings were given.  The patient verbalizes understanding and agrees to plan of care.          [name], NP 08/03/16 1434

## 2016-08-03 NOTE — ED Triage Notes (Signed)
Patient states he has not been feeling well since Thursday-fever bodyaches and fever. Took ibuprofen pta for fever of 101

## 2016-08-03 NOTE — Discharge Instructions (Signed)
Based on our discussions - start tamiflu and continue course of fluids and diet as tolerated.  Zofran as needed for nausea and use sinus rinses to help with nasal congestion. Follow up with your Primary Care Provider as needed.   A neti pot is a container designed to rinse debris or mucus from your nasal cavity. You might use a neti pot to treat symptoms of nasal allergies, sinus problems or colds. If you choose to make your own saltwater solution, it's important to use bottled water that has been distilled or sterilized. Tap water is acceptable if it's been boiled for several minutes and then left to cool until it is lukewarm. To use the neti pot, tilt your head sideways over the sink and place the spout of the neti pot in the upper nostril. Breathing through your open mouth, gently pour the saltwater solution into your upper nostril so that the liquid drains through the lower nostril. Repeat on the other side. Be sure to rinse the irrigation device after each use with similarly distilled, sterile, previously boiled and cooled, or filtered water and leave open to air dry. Neti pots are often available in pharmacies and health food stores, as well online.

## 2016-10-23 ENCOUNTER — Encounter: Payer: Self-pay | Admitting: Family Medicine

## 2016-10-23 ENCOUNTER — Ambulatory Visit (INDEPENDENT_AMBULATORY_CARE_PROVIDER_SITE_OTHER): Payer: 59 | Admitting: Family Medicine

## 2016-10-23 VITALS — BP 136/82 | HR 52 | Temp 98.4°F | Resp 16 | Ht 71.0 in | Wt 183.0 lb

## 2016-10-23 DIAGNOSIS — Z13220 Encounter for screening for lipoid disorders: Secondary | ICD-10-CM | POA: Diagnosis not present

## 2016-10-23 DIAGNOSIS — R739 Hyperglycemia, unspecified: Secondary | ICD-10-CM

## 2016-10-23 DIAGNOSIS — Z0000 Encounter for general adult medical examination without abnormal findings: Secondary | ICD-10-CM | POA: Diagnosis not present

## 2016-10-23 DIAGNOSIS — F418 Other specified anxiety disorders: Secondary | ICD-10-CM | POA: Diagnosis not present

## 2016-10-23 NOTE — Patient Instructions (Addendum)
See information below on stress and stress management. If needed, call counselor through the [employer] program at work or let me know if you would like other names for counseling. If any worsening depression or anxiety symptoms, follow-up and we can discuss other treatments.  Keeping you healthy  Get these tests  Blood pressure- Have your blood pressure checked once a year by your healthcare provider.  Normal blood pressure is 120/80.  Weight- Have your body mass index (BMI) calculated to screen for obesity.  BMI is a measure of body fat based on height and weight. You can also calculate your own BMI at [email or web address].  Cholesterol- Have your cholesterol checked regularly starting at age 31, sooner may be necessary if you have diabetes, high blood pressure, if a family member developed heart diseases at an early age or if you smoke.   Chlamydia, HIV, and other sexual transmitted disease- Get screened each year until the age of 70 then within three months of each new sexual partner.  Diabetes- Have your blood sugar checked regularly if you have high blood pressure, high cholesterol, a family history of diabetes or if you are overweight.  Get these vaccines  Flu shot- Every fall.  Tetanus shot- Every 10 years.  Menactra- Single dose; prevents meningitis.  Take these steps  Don't smoke- If you do smoke, ask your healthcare provider about quitting. For tips on how to quit, go to www.smokefree.gov or call 1-800-QUIT-NOW.  Be physically active- Exercise 5 days a week for at least 30 minutes.  If you are not already physically active start slow and gradually work up to 30 minutes of moderate physical activity.  Examples of moderate activity include walking briskly, mowing the yard, dancing, swimming bicycling, etc.  Eat a healthy diet- Eat a variety of healthy foods such as fruits, vegetables, low fat milk, low fat cheese, yogurt, lean meats, poultry, fish, beans, tofu, etc.  For more  information on healthy eating, go to www.thenutritionsource.org  Drink alcohol in moderation- Limit alcohol intake two drinks or less a day.  Never drink and drive.  Dentist- Brush and floss teeth twice daily; visit your dentis twice a year.  Depression-Your emotional health is as important as your physical health.  If you're feeling down, losing interest in things you normally enjoy please talk with your healthcare provider.  Gun Safety- If you keep a gun in your home, keep it unloaded and with the safety lock on.  Bullets should be stored separately.  Helmet use- Always wear a helmet when riding a motorcycle, bicycle, rollerblading or skateboarding.  Safe sex- If you may be exposed to a sexually transmitted infection, use a condom  Seat belts- Seat bels can save your life; always wear one.  Smoke/Carbon Monoxide detectors- These detectors need to be installed on the appropriate level of your home.  Replace batteries at least once a year.  Skin Cancer- When out in the sun, cover up and use sunscreen SPF 15 or higher.  Violence- If anyone is threatening or hurting you, please tell your healthcare provider. Stress and Stress Management Stress is a normal reaction to life events. It is what you feel when life demands more than you are used to or more than you can handle. Some stress can be useful. For example, the stress reaction can help you catch the last bus of the day, study for a test, or meet a deadline at work. But stress that occurs too often or for too long can cause problems.  It can affect your emotional health and interfere with relationships and normal daily activities. Too much stress can weaken your immune system and increase your risk for physical illness. If you already have a medical problem, stress can make it worse. What are the causes? All sorts of life events may cause stress. An event that causes stress for one person may not be stressful for another person. Major life  events commonly cause stress. These may be positive or negative. Examples include losing your job, moving into a new home, getting married, having a baby, or losing a loved one. Less obvious life events may also cause stress, especially if they occur day after day or in combination. Examples include working long hours, driving in traffic, caring for children, being in debt, or being in a difficult relationship. What are the signs or symptoms? Stress may cause emotional symptoms including, the following:  Anxiety. This is feeling worried, afraid, on edge, overwhelmed, or out of control.  Anger. This is feeling irritated or impatient.  Depression. This is feeling sad, down, helpless, or guilty.  Difficulty focusing, remembering, or making decisions.  Stress may cause physical symptoms, including the following:  Aches and pains. These may affect your head, neck, back, stomach, or other areas of your body.  Tight muscles or clenched jaw.  Low energy or trouble sleeping.  Stress may cause unhealthy behaviors, including the following:  Eating to feel better (overeating) or skipping meals.  Sleeping too little, too much, or both.  Working too much or putting off tasks (procrastination).  Smoking, drinking alcohol, or using drugs to feel better.  How is this diagnosed? Stress is diagnosed through an assessment by your health care provider. Your health care provider will ask questions about your symptoms and any stressful life events.Your health care provider will also ask about your medical history and may order blood tests or other tests. Certain medical conditions and medicine can cause physical symptoms similar to stress. Mental illness can cause emotional symptoms and unhealthy behaviors similar to stress. Your health care provider may refer you to a mental health professional for further evaluation. How is this treated? Stress management is the recommended treatment for stress.The  goals of stress management are reducing stressful life events and coping with stress in healthy ways. Techniques for reducing stressful life events include the following:  Stress identification. Self-monitor for stress and identify what causes stress for you. These skills may help you to avoid some stressful events.  Time management. Set your priorities, keep a calendar of events, and learn to say "no." These tools can help you avoid making too many commitments.  Techniques for coping with stress include the following:  Rethinking the problem. Try to think realistically about stressful events rather than ignoring them or overreacting. Try to find the positives in a stressful situation rather than focusing on the negatives.  Exercise. Physical exercise can release both physical and emotional tension. The key is to find a form of exercise you enjoy and do it regularly.  Relaxation techniques. These relax the body and mind. Examples include yoga, meditation, tai chi, biofeedback, deep breathing, progressive muscle relaxation, listening to music, being out in nature, journaling, and other hobbies. Again, the key is to find one or more that you enjoy and can do regularly.  Healthy lifestyle. Eat a balanced diet, get plenty of sleep, and do not smoke. Avoid using alcohol or drugs to relax.  Strong support network. Spend time with family, friends, or other people  you enjoy being around.Express your feelings and talk things over with someone you trust.  Counseling or talktherapy with a mental health professional may be helpful if you are having difficulty managing stress on your own. Medicine is typically not recommended for the treatment of stress.Talk to your health care provider if you think you need medicine for symptoms of stress. Follow these instructions at home:  Keep all follow-up visits as directed by your health care provider.  Take all medicines as directed by your health care  provider. Contact a health care provider if:  Your symptoms get worse or you start having new symptoms.  You feel overwhelmed by your problems and can no longer manage them on your own. Get help right away if:  You feel like hurting yourself or someone else. This information is not intended to replace advice given to you by your health care provider. Make sure you discuss any questions you have with your health care provider. Document Released: 09/17/2000 Document Revised: 08/30/2015 Document Reviewed: 11/16/2012 Elsevier Interactive Patient Education  2017 [employer].    IF you received an x-ray today, you will receive an invoice from [facility]. Please contact [facility] at [phone] with questions or concerns regarding your invoice.   IF you received labwork today, you will receive an invoice from [facility]. Please contact [facility] at [phone] with questions or concerns regarding your invoice.   Our billing staff will not be able to assist you with questions regarding bills from these companies.  You will be contacted with the lab results as soon as they are available. The fastest way to get your results is to activate your My Chart account. Instructions are located on the last page of this paperwork. If you have not heard from [place] regarding the results in 2 weeks, please contact this office.

## 2016-10-23 NOTE — Progress Notes (Signed)
By signing my name below, I, Darren Best, attest that this documentation has been prepared under the direction and in the presence of Darren Ray, MD.  Electronically Signed: Verlee Best, Medical Scribe. 10/23/16. 9:09 AM.  Subjective:    Patient ID: Darren Best, male    DOB: 1970/12/21, 47 y.o.   MRN: [id number]  HPI Chief Complaint  Patient presents with  . Annual Exam  . Other    Patient states he has been "Sad" but not depressed, trouble focusing after father passed away in 06/20/22    HPI Comments: Darren Best is a 46 y.o. male with a PMHx of IBS as well as eosinophilic esophagitis who presents to Primary Care at [facility] for his complete physical. Pt is fasting.  FHx of heart disease: Father who had a stroke. Mom and brother with DM. He was seen by cardiology May 2017. Coronary calcium score June 1st; 0. Denies FHx of prostate CA. Denies groin pain, and lump/bumps in the groin.  IBS and eosinophilic esophagitis: GI is Dr. Havery Moros. Denies any major changes. Pt noticed he will itch when he eats something with a lot of butter in it.Otherwise doing well  Hyperglycemia: Borderline on previous testing, 101- 120. Lipid screening nl last year.  Seasonal Allergies: Takes allegra, and claritin PRN for relief of his sxs.  Immunizations: Immunization History  Administered Date(s) Administered  . Influenza, Seasonal, Injecte, Preservative Fre 01/01/2015, 01/18/2016  . Tdap 04/26/2012   Vision: Hx of lasix surgery. Pt is followed by Dr. Gershon Crane, but he hasn't been seen in the past year.  Visual Acuity Screening   Right eye Left eye Both eyes  Without correction: _0  With correction:      Dentist: Followed by a dentist with biannual appts.  Exercise/diet: He runs and bikes. Reports he exercises a couple of times a week with yoga, orange theory workout, and personal trainer 1x a week. Personal trainer "irritated" his left hip for a while but his sxs  resolved since. He stopped doing orange theory in the summer since he's outside exercising. He admits to making poor diet choices at times since his dad passed.    Depression Screening: Pt's dad unexpectedly passed in 06/19/2016 and he's still dealing with grief. Pt is doing okay, but he feels more scatter brained that nl, and his stress and anxiety is higher than typical. Reports there is a lot going on at work and he's considering going to counseling through the [facility]. He's still functioning fine at work. Denies depressed mood, loss of things he typically enjoys.   Depression screen [name] 2/9 10/23/2016 10/18/2015 10/16/2014 10/10/2013  Decreased Interest 0 0 0 0  Down, Depressed, Hopeless 0 0 0 0  PHQ - 2 Score 0 0 0 0    Patient Active Problem List   Diagnosis Date Noted  . Eosinophilic esophagitis 20/94/7096  . IBS (irritable bowel syndrome) 05/15/2015  . Cyst, eyelid sebaceous 03/30/2013   Past Medical History:  Diagnosis Date  . Allergy   . Dysphagia   . GERD (gastroesophageal reflux disease)   . History of shingles   . Irritable bowel syndrome    Past Surgical History:  Procedure Laterality Date  . EYE SURGERY    . lasix surgery     Allergies  Allergen Reactions  . Almond Oil Bitter Flavor [Flavoring Agent]   . Milk-Related Compounds    Prior to Admission medications   Medication Sig Start Date End Date Taking? Authorizing  Provider  esomeprazole (NEXIUM) 40 MG capsule Take 1 capsule (40 mg total) by mouth daily. 11/01/15  Yes Armbruster, Renelda Loma, MD  famotidine (PEPCID) 20 MG tablet Take 20 mg by mouth daily as needed for heartburn or indigestion.    Yes [provider]  hyoscyamine (LEVSIN, ANASPAZ) 0.125 MG tablet Take 1 tablet (0.125 mg total) by mouth every 4 (four) hours as needed for cramping. 05/15/15  Yes Armbruster, Renelda Loma, MD  ranitidine (ZANTAC) 150 MG capsule Take 150 mg by mouth 2 (two) times daily as needed for heartburn.    Yes [provider]  ondansetron (ZOFRAN) 4 MG tablet Take 1 tablet (4 mg total) by mouth every 8 (eight) hours as needed for nausea or vomiting. Patient not taking: Reported on 10/23/2016 08/03/16   Darren Settle, NP   Social History   Social History  . Marital status: Married    Spouse name: N/A  . Number of children: 0  . Years of education: N/A   Occupational History  . [employer]    IT/ [employer]   Social History Main Topics  . Smoking status: Former Research scientist (life sciences)  . Smokeless tobacco: Never Used  . Alcohol use Yes     Comment: 2 drinks daily - 2 beers max  . Drug use: No  . Sexual activity: Yes     Comment: number of sex partners in the last 12 months 1   Other Topics Concern  . Not on file   Social History Narrative   College degree   [employer]   2-3 caffeine drinks daily   Exercise cycling and gym classes 2-3 times per week for 1-2 hours    Review of Systems  Allergic/Immunologic: Positive for environmental allergies and food allergies.  Psychiatric/Behavioral: Positive for confusion. The patient is nervous/anxious.   13 point ROS positive for the above only Objective:  Physical Exam  Constitutional: He is oriented to person, place, and time. He appears well-developed and well-nourished.  HENT:  Head: Normocephalic and atraumatic.  Right Ear: External ear normal.  Left Ear: External ear normal.  Mouth/Throat: Oropharynx is clear and moist.  Eyes: Pupils are equal, round, and reactive to light. Conjunctivae and EOM are normal.  Neck: Normal range of motion. Neck supple. No thyromegaly present.  Cardiovascular: Normal rate, regular rhythm, normal heart sounds and intact distal pulses.   Pulmonary/Chest: Effort normal and breath sounds normal. No respiratory distress. He has no wheezes.  Abdominal: Soft. He exhibits no distension. There is no tenderness.  Musculoskeletal: Normal range of motion. He exhibits no edema or tenderness.  Lymphadenopathy:    He has no cervical  adenopathy.  Neurological: He is alert and oriented to person, place, and time. He has normal reflexes.  Skin: Skin is warm and dry.  Psychiatric: He has a normal mood and affect. His behavior is normal.  Vitals reviewed.  Vitals:   10/23/16 0832  BP: 136/82  Pulse: (!) 52  Resp: 16  Temp: 98.4 F (36.9 C)  TempSrc: Oral  SpO2: 99%  Weight: 183 lb (83 kg)  Height: _0  (1.803 m)   Body mass index is 25.52 kg/m. Assessment & Plan:  DAMEIN GAUNCE is a 46 y.o. male Annual physical exam  - -anticipatory guidance as below in AVS, screening labs above. Health maintenance items as above in HPI discussed/recommended as applicable.   Situational anxiety  - may be component of grief as well as situational anxiety as he  is expressing the past. Agree with me and with counselor through EAP program, or I can provide other numbers if needed. Also to continue exercises as been helpful for stress management previously and handout given on stress  Hyperglycemia - Plan: Comprehensive metabolic panel  - Borderline hyperglycemia. See. Check CMP.  Screening for hyperlipidemia - Plan: Lipid panel  - Improved last year, repeat testing. Family history of heart disease/throat, if elevated would check ASCVD risk, but if borderline would consider statin with his family history. Normal coronary calcium scoring is reassuring previously.   No orders of the defined types were placed in this encounter.  Patient Instructions    See information below on stress and stress management. If needed, call counselor through the EAP program at work or let me know if you would like other names for counseling. If any worsening depression or anxiety symptoms, follow-up and we can discuss other treatments.  Keeping you healthy  Get these tests  Blood pressure- Have your blood pressure checked once a year by your healthcare provider.  Normal blood pressure is 120/80.  Weight- Have your body mass index (BMI)  calculated to screen for obesity.  BMI is a measure of body fat based on height and weight. You can also calculate your own BMI at GravelBags.it.  Cholesterol- Have your cholesterol checked regularly starting at age 12, sooner may be necessary if you have diabetes, high blood pressure, if a family member developed heart diseases at an early age or if you smoke.   Chlamydia, HIV, and other sexual transmitted disease- Get screened each year until the age of 71 then within three months of each new sexual partner.  Diabetes- Have your blood sugar checked regularly if you have high blood pressure, high cholesterol, a family history of diabetes or if you are overweight.  Get these vaccines  Flu shot- Every fall.  Tetanus shot- Every 10 years.  Menactra- Single dose; prevents meningitis.  Take these steps  Don't smoke- If you do smoke, ask your healthcare provider about quitting. For tips on how to quit, go to www.smokefree.gov or call 1-800-QUIT-NOW.  Be physically active- Exercise 5 days a week for at least 30 minutes.  If you are not already physically active start slow and gradually work up to 30 minutes of moderate physical activity.  Examples of moderate activity include walking briskly, mowing the yard, dancing, swimming bicycling, etc.  Eat a healthy diet- Eat a variety of healthy foods such as fruits, vegetables, low fat milk, low fat cheese, yogurt, lean meats, poultry, fish, beans, tofu, etc.  For more information on healthy eating, go to www.thenutritionsource.org  Drink alcohol in moderation- Limit alcohol intake two drinks or less a day.  Never drink and drive.  Dentist- Brush and floss teeth twice daily; visit your dentis twice a year.  Depression-Your emotional health is as important as your physical health.  If you're feeling down, losing interest in things you normally enjoy please talk with your healthcare provider.  Gun Safety- If you keep a gun in your home,  keep it unloaded and with the safety lock on.  Bullets should be stored separately.  Helmet use- Always wear a helmet when riding a motorcycle, bicycle, rollerblading or skateboarding.  Safe sex- If you may be exposed to a sexually transmitted infection, use a condom  Seat belts- Seat bels can save your life; always wear one.  Smoke/Carbon Monoxide detectors- These detectors need to be installed on the appropriate level of your home.  Replace batteries at least once a year.  Skin Cancer- When out in the sun, cover up and use sunscreen SPF 15 or higher.  Violence- If anyone is threatening or hurting you, please tell your healthcare provider. Stress and Stress Management Stress is a normal reaction to life events. It is what you feel when life demands more than you are used to or more than you can handle. Some stress can be useful. For example, the stress reaction can help you catch the last bus of the day, study for a test, or meet a deadline at work. But stress that occurs too often or for too long can cause problems. It can affect your emotional health and interfere with relationships and normal daily activities. Too much stress can weaken your immune system and increase your risk for physical illness. If you already have a medical problem, stress can make it worse. What are the causes? All sorts of life events may cause stress. An event that causes stress for one person may not be stressful for another person. Major life events commonly cause stress. These may be positive or negative. Examples include losing your job, moving into a new home, getting married, having a baby, or losing a loved one. Less obvious life events may also cause stress, especially if they occur day after day or in combination. Examples include working long hours, driving in traffic, caring for children, being in debt, or being in a difficult relationship. What are the signs or symptoms? Stress may cause emotional symptoms  including, the following:  Anxiety. This is feeling worried, afraid, on edge, overwhelmed, or out of control.  Anger. This is feeling irritated or impatient.  Depression. This is feeling sad, down, helpless, or guilty.  Difficulty focusing, remembering, or making decisions.  Stress may cause physical symptoms, including the following:  Aches and pains. These may affect your head, neck, back, stomach, or other areas of your body.  Tight muscles or clenched jaw.  Low energy or trouble sleeping.  Stress may cause unhealthy behaviors, including the following:  Eating to feel better (overeating) or skipping meals.  Sleeping too little, too much, or both.  Working too much or putting off tasks (procrastination).  Smoking, drinking alcohol, or using drugs to feel better.  How is this diagnosed? Stress is diagnosed through an assessment by your health care provider. Your health care provider will ask questions about your symptoms and any stressful life events.Your health care provider will also ask about your medical history and may order blood tests or other tests. Certain medical conditions and medicine can cause physical symptoms similar to stress. Mental illness can cause emotional symptoms and unhealthy behaviors similar to stress. Your health care provider may refer you to a mental health professional for further evaluation. How is this treated? Stress management is the recommended treatment for stress.The goals of stress management are reducing stressful life events and coping with stress in healthy ways. Techniques for reducing stressful life events include the following:  Stress identification. Self-monitor for stress and identify what causes stress for you. These skills may help you to avoid some stressful events.  Time management. Set your priorities, keep a calendar of events, and learn to say "no." These tools can help you avoid making too many commitments.  Techniques  for coping with stress include the following:  Rethinking the problem. Try to think realistically about stressful events rather than ignoring them or overreacting. Try to find the positives in a stressful situation rather  than focusing on the negatives.  Exercise. Physical exercise can release both physical and emotional tension. The key is to find a form of exercise you enjoy and do it regularly.  Relaxation techniques. These relax the body and mind. Examples include yoga, meditation, tai chi, biofeedback, deep breathing, progressive muscle relaxation, listening to music, being out in nature, journaling, and other hobbies. Again, the key is to find one or more that you enjoy and can do regularly.  Healthy lifestyle. Eat a balanced diet, get plenty of sleep, and do not smoke. Avoid using alcohol or drugs to relax.  Strong support network. Spend time with family, friends, or other people you enjoy being around.Express your feelings and talk things over with someone you trust.  Counseling or talktherapy with a mental health professional may be helpful if you are having difficulty managing stress on your own. Medicine is typically not recommended for the treatment of stress.Talk to your health care provider if you think you need medicine for symptoms of stress. Follow these instructions at home:  Keep all follow-up visits as directed by your health care provider.  Take all medicines as directed by your health care provider. Contact a health care provider if:  Your symptoms get worse or you start having new symptoms.  You feel overwhelmed by your problems and can no longer manage them on your own. Get help right away if:  You feel like hurting yourself or someone else. This information is not intended to replace advice given to you by your health care provider. Make sure you discuss any questions you have with your health care provider. Document Released: 09/17/2000 Document Revised:  08/30/2015 Document Reviewed: 11/16/2012 Elsevier Interactive Patient Education  2017 [employer].    IF you received an x-Best today, you will receive an invoice from [facility]. Please contact [facility] at [phone] with questions or concerns regarding your invoice.   IF you received labwork today, you will receive an invoice from [facility]. Please contact [facility] at [phone] with questions or concerns regarding your invoice.   Our billing staff will not be able to assist you with questions regarding bills from these companies.  You will be contacted with the lab results as soon as they are available. The fastest way to get your results is to activate your My Chart account. Instructions are located on the last page of this paperwork. If you have not heard from [place] regarding the results in 2 weeks, please contact this office.      I personally performed the services described in this documentation, which was scribed in my presence. The recorded information has been reviewed and considered for accuracy and completeness, addended by me as needed, and agree with information above.  Signed,   Darren Ray, MD [facility] at [facility].  10/23/16 9:32 AM

## 2016-10-24 LAB — LIPID PANEL
CHOL/HDL RATIO: 2.8 ratio (ref 0.0–5.0)
Cholesterol, Total: 208 mg/dL — ABNORMAL HIGH (ref 100–199)
HDL: 73 mg/dL (ref >39)
LDL Calculated: 121 mg/dL — ABNORMAL HIGH (ref 0–99)
Triglycerides: 68 mg/dL (ref 0–149)
VLDL Cholesterol Cal: 14 mg/dL (ref 5–40)

## 2016-10-24 LAB — COMPREHENSIVE METABOLIC PANEL
ALBUMIN: 4.7 g/dL (ref 3.5–5.5)
ALT: 17 IU/L (ref 0–44)
AST: 24 IU/L (ref 0–40)
Albumin/Globulin Ratio: 1.7 (ref 1.2–2.2)
Alkaline Phosphatase: 64 IU/L (ref 39–117)
BILIRUBIN TOTAL: 0.5 mg/dL (ref 0.0–1.2)
BUN / CREAT RATIO: 11 (ref 9–20)
BUN: 10 mg/dL (ref 6–24)
CHLORIDE: 101 mmol/L (ref 96–106)
CO2: 22 mmol/L (ref 20–29)
Calcium: 9.6 mg/dL (ref 8.7–10.2)
Creatinine, Ser: 0.9 mg/dL (ref 0.76–1.27)
GFR calc non Af Amer: 101 mL/min/{1.73_m2} (ref >59)
GFR, EST AFRICAN AMERICAN: 117 mL/min/{1.73_m2} (ref >59)
GLUCOSE: 102 mg/dL — AB (ref 65–99)
Globulin, Total: 2.8 g/dL (ref 1.5–4.5)
Potassium: 4.4 mmol/L (ref 3.5–5.2)
Sodium: 142 mmol/L (ref 134–144)
TOTAL PROTEIN: 7.5 g/dL (ref 6.0–8.5)

## 2016-10-24 MED FILL — ESOMEPRAZOLE MAG DR 40 MG C: 40 | 90 days supply | Qty: 90 | Fill #3

## 2016-12-02 ENCOUNTER — Ambulatory Visit (INDEPENDENT_AMBULATORY_CARE_PROVIDER_SITE_OTHER): Payer: 59 | Admitting: Sports Medicine

## 2016-12-02 ENCOUNTER — Encounter: Payer: Self-pay | Admitting: Sports Medicine

## 2016-12-02 ENCOUNTER — Ambulatory Visit (INDEPENDENT_AMBULATORY_CARE_PROVIDER_SITE_OTHER): Payer: 59

## 2016-12-02 ENCOUNTER — Ambulatory Visit: Payer: Self-pay

## 2016-12-02 VITALS — BP 120/82 | HR 52 | Ht 71.0 in | Wt 186.4 lb

## 2016-12-02 DIAGNOSIS — M25552 Pain in left hip: Secondary | ICD-10-CM | POA: Insufficient documentation

## 2016-12-02 DIAGNOSIS — M6289 Other specified disorders of muscle: Secondary | ICD-10-CM

## 2016-12-02 MED ORDER — NITROGLYCERIN 0.2 MG/HR TD PT24: MEDICATED_PATCH | TRANSDERMAL | 1 refills | Status: DC

## 2016-12-02 MED FILL — NITROGLYCERIN 0.2 MG/HR PTC: 0.2 | 30 days supply | Qty: 15 | Fill #0

## 2016-12-02 NOTE — Patient Instructions (Signed)
It was great to meet you.  Try performing the exercises are provided to the handouts with your trainer as well as on your own.  Also look into the YouTube videos as below.  We will rescan this in 6 weeks to ensure that is continuing to improve.  Stick to cycling and things do not bother the hip until I see you then.  If you do try to run try to run and walk for 1 minute on 1 minute off.   Also check out [employer]" which is a program developed by Dr. Minerva Ends.   There are links to a couple of his YouTube Videos below and I would like to see performing one of his videos 5-6 days per week.    A good intro video is: "Independence from Pain 7-minute Video" - [email or web address].com   His more advanced video is: "Powerful Posture and Pain Relief: 12 minutes of Foundation Training" - https://youtu.be/[email or web address]  Do not try to attempt this entire video when first beginning.    Try breaking of each exercise that he goes into shorter segments.  Otherwise if they perform an exercise for 45 seconds, start with 15 seconds and rest and then resume with a begin the new activity.    If you work your way up to doing this 12 minute video, I expect you will see significant improvements in your pain.  If you enjoy his videos and would like to find out more you can look on his website: [email or web address]  He has a workout streaming option as well as a DVD set available for purchase.  Amazon has the best price for his DVDs.     Nitroglycerin Protocol   Apply 1/4 nitroglycerin patch to affected area daily.  Change position of patch within the affected area every 24 hours.  You may experience a headache during the first 1-2 weeks of using the patch, these should subside.  If you experience headaches after beginning nitroglycerin patch treatment, you may take your preferred over the counter pain reliever.  Another side effect of the nitroglycerin patch is skin irritation or  rash related to patch adhesive.  Please notify our office if you develop more severe headaches or rash, and stop the patch.  Tendon healing with nitroglycerin patch may require 12 to 24 weeks depending on the extent of injury.  Men should not use if taking Viagra, Cialis, or Levitra.   Do not use if you have migraines or rosacea.

## 2016-12-02 NOTE — Progress Notes (Signed)
OFFICE VISIT NOTE Darren Best. Darren Best, [place] at [place]  Darren Best - 46 y.o. male MRN [id number]  Date of birth: Nov 24, 1970  Visit Date: 12/02/2016  PCP: Darren Agreste, MD   Referred by: Darren Agreste, MD  Darren Best, CMA acting as scribe for Dr. Paulla Best.  SUBJECTIVE:   Chief Complaint  Patient presents with  . New Patient (Initial Visit)    lt hip pain   HPI: As below and per problem based documentation when appropriate.  Mr. Darren Best is a new patient presenting today with complaint of lt hip pain.  Pain started about 3 months ago while pt was working with a Physiological scientist at [employer]. He doesn't recall a specific exercise that may have caused the pain.  Pain is located mostly on the lateral aspect of the left hip and radiates into the glute, and rt side of the lower back. Pt denies groin pain.  Pain is described as intermittent aching and is rated about 6/10.  Pt is worse with physical activity, running, going up or down stairs. Cycling doesn't seem to bother the hip much. Pain seems to be at its worst in the mornings, specifically after exercises the night before. Pain is about the same with abduction and adduction.  Pain improves with inactivity, running less. He has tried taking Ibuprofen and icing the hip with some short term relief.   No recent xray of hip of lower back.     Review of Systems  Constitutional: Negative for chills and fever.  Respiratory: Negative for shortness of breath and wheezing.   Cardiovascular: Negative for chest pain, palpitations and leg swelling.  Musculoskeletal: Positive for back pain and joint pain. Negative for falls.  Neurological: Negative for dizziness, tingling and headaches.  Endo/Heme/Allergies: Does not bruise/bleed easily.    Otherwise per HPI.  HISTORY & PERTINENT PRIOR DATA:  No specialty comments available. He reports that he has quit  smoking. He has never used smokeless tobacco. No results for input(s): HGBA1C, LABURIC in the last 8760 hours. Medications & Allergies reviewed per EMR Patient Active Problem List   Diagnosis Date Noted  . Pain of left hip joint 12/02/2016  . Eosinophilic esophagitis 01/60/1093  . IBS (irritable bowel syndrome) 05/15/2015  . Cyst, eyelid sebaceous 03/30/2013   Past Medical History:  Diagnosis Date  . Allergy   . Dysphagia   . GERD (gastroesophageal reflux disease)   . History of shingles   . Irritable bowel syndrome    Family History  Problem Relation Age of Onset  . Depression Father   . Heart disease Father        some coronary issues  . Aneurysm Father        kidney  . Mental illness Father   . Stroke Father   . Colon cancer Paternal Grandmother   . Diabetes Mother        adult onset  . Diabetes Brother    Past Surgical History:  Procedure Laterality Date  . EYE SURGERY    . lasix surgery     Social History   Occupational History  . [employer]    IT/ [employer]   Social History Main Topics  . Smoking status: Former Research scientist (life sciences)  . Smokeless tobacco: Never Used  . Alcohol use Yes     Comment: 2 drinks daily - 2 beers max  . Drug use: No  . Sexual activity: Yes  Comment: number of sex partners in the last 12 months 1    OBJECTIVE:  VS:  HT:5\' 11"  (180.3 cm)   WT:186 lb 6.4 oz (84.6 kg)  BMI:26.01    BP:120/82  HR:(!) 52bpm  TEMP: ( )  RESP:96 % EXAM: Findings:  WDWN, NAD, Non-toxic appearing Alert & appropriately interactive Not depressed or anxious appearing No increased work of breathing. Pupils are equal. EOM intact without nystagmus No clubbing or cyanosis of the extremities appreciated No significant rashes/lesions/ulcerations overlying the examined area. DP & PT pulses 2+/4.  No significant pretibial edema.  Left hip and back: Overall well aligned.  He has negative straight leg raise bilaterally.  Good internal and external rotation of  bilateral hips.  He has pain with hip abduction testing on the left compared to the right.  And focal TTP over the origin of the tensor fascia lata just inferior to the iliac crest.  Strength is 4 out of 5 with a tensor fascia lata predominance.  Lower extremity strength is 5+/5 in all myotomes.  Sensation is intact to light touch.  Lower extremity DTRs are 2+/4.  No pain with Stinchfield testing or with logroll/FADIR.     [place] Limited Joint Space Structures Low Left(no Linked Charges)  Result Date: 12/02/2016 Darren Diss, DO     12/09/2016  8:07 AM LIMITED MSK ULTRASOUND OF Left hip Images were obtained and interpreted by myself, Darren Coombs, DO Images have been saved and stored to PACS system. Images obtained on: GE S7 Ultrasound machine FINDINGS:  Hip abductor's are overall attached and well defined.  He does have an area of increased neovascularity at the origin of the tensor fascia lata correlating with his area of pain.  There is pain with sono palpation. IMPRESSION: 1. Tensor fossa lata origin muscle strain   Dg Hip Unilat W Or W/o Pelvis 2-3 Views Left  Result Date: 12/02/2016 CLINICAL DATA:  Worsening left hip pain for several months, no acute injury EXAM: DG HIP (WITH OR WITHOUT PELVIS) 2-3V LEFT COMPARISON:  None. FINDINGS: No acute fracture is seen. No significant degenerative change of the hips is seen although there is slight loss of left hip joint space. The pelvic rami are intact. The SI joints are corticated. IMPRESSION: No acute abnormality. Electronically Signed   By: Darren Best M.D.   On: 12/02/2016 17:10   ASSESSMENT & PLAN:     ICD-10-CM   1. 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  Nitroglycerin protocol and therapeutic exercises reviewed.  Follow-up in 6 weeks for clinical reevaluation.  Avoid running at this time.  Continue with cycling. ================================================================= ================================================================= Future Appointments Date Time Provider [place]  01/27/2017 2:00 PM Darren Diss, DO [facility] None    Follow-up: Return in about 6 weeks (around 01/13/2017).   CMA/ATC served as Education administrator during this visit. History, Physical, and Plan performed by medical provider. Documentation and orders reviewed and attested to.      Darren Best, [place] [facility] Physician

## 2016-12-09 NOTE — Procedures (Signed)
LIMITED MSK ULTRASOUND OF Left hip Images were obtained and interpreted by myself, Teresa Coombs, DO  Images have been saved and stored to PACS system. Images obtained on: GE S7 Ultrasound machine  FINDINGS:   Hip abductor's are overall attached and well defined.  He does have an area of increased neovascularity at the origin of the tensor fascia lata correlating with his area of pain.  There is pain with sono palpation.  IMPRESSION:  1. Tensor fossa lata origin muscle strain

## 2016-12-09 NOTE — Assessment & Plan Note (Signed)
Symptoms are consistent with a partial tear of the origin of the tensor fascia lata.  Nitroglycerin protocol and therapeutic exercises reviewed.  Follow-up in 6 weeks for clinical reevaluation.  Avoid running at this time.  Continue with cycling.

## 2017-01-27 ENCOUNTER — Ambulatory Visit: Payer: 59 | Admitting: Sports Medicine

## 2017-02-03 ENCOUNTER — Encounter: Payer: Self-pay | Admitting: Sports Medicine

## 2017-02-03 ENCOUNTER — Ambulatory Visit (INDEPENDENT_AMBULATORY_CARE_PROVIDER_SITE_OTHER): Payer: 59 | Admitting: Sports Medicine

## 2017-02-03 VITALS — BP 114/82 | HR 52 | Ht 71.0 in | Wt 188.6 lb

## 2017-02-03 DIAGNOSIS — M25552 Pain in left hip: Secondary | ICD-10-CM

## 2017-02-03 DIAGNOSIS — M6289 Other specified disorders of muscle: Secondary | ICD-10-CM | POA: Diagnosis not present

## 2017-02-03 NOTE — Progress Notes (Signed)
OFFICE VISIT NOTE Darren Best. Darren Best, [place] at [place]  KAWHI DIEBOLD - 46 y.o. male MRN [id number]  Date of birth: [birth year]  Visit Date: 02/03/2017  PCP: [name], MD   Referred by: [name], MD  Thalia Bloodgood PT, LAT, ATC acting as scribe for Dr. Paulla Fore.  SUBJECTIVE:   Chief Complaint  Patient presents with  . Follow-up    L hip pain - TFL    HPI: As below and per problem based documentation when appropriate.  Mr. Darren Best is an established pt and is presenting today for f/u of L hip / TFL pain.  He was last seen on 12/02/16 and was prescribed nitroglycerin patches and given the [facility] exercises.  Pt states that his L hip is better but is still bothering him.  He notes that he has continued to work w/ his Physiological scientist and notes that he has modified his programming to focus more on his glutes.  He states that he has resumed running and that the pain w/ running is better but still bothers him, particularly on un-even terrain.  He states that he first tried running again about one month after his last visit.  He states that he experienced headaches on a 1/2 patch of the nitroglycerin so changed to a 1/4 patch.  Pt rates himself at 50-60% improvement since his last visit.    ROS  Otherwise per HPI.   HISTORY & PERTINENT PRIOR DATA:  Prior History reviewed and updated per electronic medical record.  Significant history, findings, studies and interim changes include:  reports that he has quit smoking. he has never used smokeless tobacco. No results for input(s): HGBA1C, LABURIC, CREATINE in the last 8760 hours. No specialty comments available. No problems updated.  OBJECTIVE:  VS:  HT:5\' 11"  (180.3 cm)   WT:188 lb 9.6 oz (85.5 kg)  BMI:26.32    BP:114/82  HR:(!) 52bpm  TEMP: ( )  RESP:97 %  PHYSICAL EXAM: Constitutional: WDWN, Non-toxic appearing. Psychiatric: Alert & appropriately  interactive. Not depressed or anxious appearing. Respiratory: No increased work of breathing. Trachea Midline Eyes: Pupils are equal. EOM intact without nystagmus. No scleral icterus Cardiovascular:  Peripheral Pulses: peripheral pulses symmetrical No clubbing or cyanosis appreciated Capillary Refill is normal, less than 2 seconds No signficant generalized edema/anasarca Sensory Exam: intact to light touch  Hip: Overall well aligned.  He has good range of motion of the bilateral hips.  He has weakness with hip abduction on the left.  Mild CTP over the gluteal musculature   No additional findings.   ASSESSMENT & PLAN:   1. Pain of left hip joint   2. Tensor fascia lata syndrome    PLAN: Overall he has progressed quite well.  He is interested in continuing with the current course with the nitroglycerin and therapeutic exercises.  We will plan to check in in 6 weeks and plan to rescan time given the fact that he is continued to have symptoms repeat scanning we will not change management at this time.  Discussed the importance of continued therapeutic exercises and nitroglycerin this time given the fact that he is showing good improvement in his symptoms.  Discussed options for alternative treatments including corticosteroid injections but he is not interested at this time.  ++++++++++++++++++++++++++++++++++++++++++++ Orders & Meds: No orders of the defined types were placed in this encounter.   No orders of the defined types were placed in  this encounter.   ++++++++++++++++++++++++++++++++++++++++++++ Follow-up: Return in about 6 weeks (around 03/17/2017).   Pertinent documentation may be included in additional procedure notes, imaging studies, problem based documentation and patient instructions. Please see these sections of the encounter for additional information regarding this visit. CMA/ATC served as Education administrator during this visit. History, Physical, and Plan performed by medical  provider. Documentation and orders reviewed and attested to.      Gerda Diss, [place] [facility] Physician

## 2017-03-17 ENCOUNTER — Ambulatory Visit: Payer: 59 | Admitting: Sports Medicine

## 2017-03-18 ENCOUNTER — Encounter: Payer: Self-pay | Admitting: Sports Medicine

## 2017-03-24 ENCOUNTER — Ambulatory Visit (INDEPENDENT_AMBULATORY_CARE_PROVIDER_SITE_OTHER): Payer: 59 | Admitting: Sports Medicine

## 2017-03-24 ENCOUNTER — Encounter: Payer: Self-pay | Admitting: Sports Medicine

## 2017-03-24 DIAGNOSIS — M25552 Pain in left hip: Secondary | ICD-10-CM | POA: Diagnosis not present

## 2017-03-24 NOTE — Progress Notes (Signed)
Darren Best. Darren Best, [place] at [place]  Darren Best - 47 y.o. male MRN [id number]  Date of birth: 1970-10-20  Visit Date: 03/24/2017  PCP: [name], MD   Referred by: [name], MD   Scribe for today's visit: Josepha Pigg, CMA    SUBJECTIVE:  Darren Best is here for Follow-up (LT hip pain)  Compared to the last office visit, his previously described symptoms are improving, he is able to run with no discomfort. Occasionally he will still have slight tightness in the hip in the morning.  Current symptoms are mild & are nonradiating He has tried following [employer], doing [employer] and has also been working with a Physiological scientist. He stopped using Nitroglycerin patches a couple of weeks ago. He is still seeing a Physiological scientist, he is doing this in place of the [place] exercise videos.    ROS Denies night time disturbances. Denies fevers, chills, or night sweats. Denies unexplained weight loss. Denies personal history of cancer. Denies changes in bowel or bladder habits. Denies recent unreported falls. Denies new or worsening dyspnea or wheezing. Denies headaches or dizziness.  Denies numbness, tingling or weakness  In the extremities.  Denies dizziness or presyncopal episodes Denies lower extremity edema     HISTORY & PERTINENT PRIOR DATA:  Prior History reviewed and updated per electronic medical record.  Significant history, findings, studies and interim changes include:  reports that he has quit smoking. he has never used smokeless tobacco. No results for input(s): HGBA1C, LABURIC, CREATINE in the last 8760 hours. No specialty comments available. Problem  Pain of Left Hip Joint    OBJECTIVE:  VS:  HT:5\' 11"  (180.3 cm)   WT:188 lb 9.6 oz (85.5 kg)  BMI:26.32    BP:122/84  HR:(!) 53bpm  TEMP: ( )  RESP:96 %  PHYSICAL EXAM: Constitutional: WDWN, Non-toxic  appearing. Psychiatric: Alert & appropriately interactive. Not depressed or anxious appearing. Respiratory: No increased work of breathing. Trachea Midline Eyes: ,  Cardiovascular:  Peripheral Pulses: peripheral pulses symmetrical No clubbing or cyanosis appreciated Capillary Refill is normal, less than 2 seconds No signficant generalized edema/anasarca Sensory Exam: intact to light touch  Left hip: Overall well aligned.  He has no pain with straight leg raise.  He is tight with hip flexor stretching bilaterally.  He has a markedly predominant T FL recruitment pattern but no focal tenderness and strength is 5/5 with resisted hip abduction at 5 of flexion as well as 5 of extension.   No additional findings.   ASSESSMENT & PLAN:   1. Pain of left hip joint    PLAN:   Pain of left hip joint This has essentially resolved.  He is not having any pain at this time with normal day-to-day activities.  There is a very small focal area of pain midportion of the tensor fascia lata that does not cause any radiation into his back correlating with the prior area of injury.  He is okay to continue with activities as tolerated and continue working on hip abduction stretching and strengthening in addition to hip flexor stretching.  Emphasized the importance of doing this on a regular, almost daily basis.  If any recurrent symptoms am happy to see him back at any time and can consider further intervention but at this time no restrictions..   ++++++++++++++++++++++++++++++++++++++++++++ Orders & Meds: No orders of the defined types were placed in this encounter.  No orders of the defined types were placed in this encounter.   ++++++++++++++++++++++++++++++++++++++++++++ Follow-up: No Follow-up on file.   Pertinent documentation may be included in additional procedure notes, imaging studies, problem based documentation and patient instructions. Please see these sections of the encounter for additional  information regarding this visit. CMA/ATC served as Education administrator during this visit. History, Physical, and Plan performed by medical provider. Documentation and orders reviewed and attested to.      Gerda Diss, [place] [facility] Physician

## 2017-03-24 NOTE — Assessment & Plan Note (Signed)
This has essentially resolved.  He is not having any pain at this time with normal day-to-day activities.  There is a very small focal area of pain midportion of the tensor fascia lata that does not cause any radiation into his back correlating with the prior area of injury.  He is okay to continue with activities as tolerated and continue working on hip abduction stretching and strengthening in addition to hip flexor stretching.  Emphasized the importance of doing this on a regular, almost daily basis.  If any recurrent symptoms am happy to see him back at any time and can consider further intervention but at this time no restrictions.Marland Kitchen

## 2017-05-18 ENCOUNTER — Encounter: Payer: Self-pay | Admitting: Gastroenterology

## 2017-05-18 ENCOUNTER — Ambulatory Visit (INDEPENDENT_AMBULATORY_CARE_PROVIDER_SITE_OTHER): Payer: 59 | Admitting: Gastroenterology

## 2017-05-18 ENCOUNTER — Other Ambulatory Visit (INDEPENDENT_AMBULATORY_CARE_PROVIDER_SITE_OTHER): Payer: 59

## 2017-05-18 VITALS — BP 108/68 | HR 64 | Ht 71.0 in | Wt 184.1 lb

## 2017-05-18 DIAGNOSIS — K580 Irritable bowel syndrome with diarrhea: Secondary | ICD-10-CM | POA: Diagnosis not present

## 2017-05-18 DIAGNOSIS — K200 Eosinophilic esophagitis: Secondary | ICD-10-CM | POA: Diagnosis not present

## 2017-05-18 LAB — IGA: IgA: 960 mg/dL — ABNORMAL HIGH (ref 68–378)

## 2017-05-18 MED ORDER — OMEPRAZOLE 20 MG PO CPDR: 20.0000 mg | DELAYED_RELEASE_CAPSULE | Freq: Every day | ORAL | 3 refills | Status: DC

## 2017-05-18 MED ORDER — ELUXADOLINE 75 MG PO TABS: 75.0000 mg | ORAL_TABLET | Freq: Two times a day (BID) | ORAL | 0 refills | Status: DC

## 2017-05-18 MED FILL — OMEPRAZOLE 20 MG CAP: 20 | 90 days supply | Qty: 90 | Fill #0

## 2017-05-18 NOTE — Progress Notes (Signed)
HPI :  47 year old male here for a follow-up visit.  Please see prior notes regarding full history of this case. He has a history of suspected eosinophilic esophagitis with history of food impactions.  He has had prior allergy testing which has shown allergies to milk, tomatoes, squash, and almonds.  He has previously been on oral Flovent as well as PPIs in the past.   Since last visit he was seen by Dr. Johnsie Cancel of cardiology and had cardiac workup to ensure his symptoms are not related to his heart.  He states this workup was negative.  He states overall he is been doing pretty well.  He has tried to cut out dairy certainly L milk from his diet and he thinks this is helped.  He has occasional rare chest discomfort which is sporadic and fleeting.  He denies much of any reflux or heartburn symptoms.  He has very rare dysphagia but not too bothersome.  He denies any odynophagia.  He is taking omeprazole 20 mg once daily after he ran out of his Nexium 40 mg/day.  He purchase this over-the-counter to see if it would help and he states he thinks it works just as well as Nexium.  He otherwise states he has ongoing loose stools from suspected irritable bowel syndrome.  He uses Levsin for cramps as needed which helps.  He has been using Imodium at least once a week for his loose stools.  He has not been on any maintenance therapy in the past.  He does not think he is ever been tested for celiac disease previously.  He still has his gallbladder.  EGD 01/2011 - distal esophageal stricture, dilated with 43mm Savory, increased Eos to 37 / HPF EGD 06/2015 - subtle trachealization and furrowing of the esophagus, without stenosis, with the remainder of the exam being normal. Biopsies show increased eosinophils of the esophagus   Past Medical History:  Diagnosis Date  . Allergy   . Dysphagia   . Eosinophilic esophagitis   . GERD (gastroesophageal reflux disease)   . History of shingles   . Irritable bowel  syndrome      Past Surgical History:  Procedure Laterality Date  . EYE SURGERY    . lasix surgery     Family History  Problem Relation Age of Onset  . Depression Father   . Heart disease Father        some coronary issues  . Aneurysm Father        kidney  . Mental illness Father   . Stroke Father   . Colon cancer Paternal Grandmother   . Diabetes Mother        adult onset  . Diabetes Brother    Social History   Tobacco Use  . Smoking status: Former Research scientist (life sciences)  . Smokeless tobacco: Never Used  Substance Use Topics  . Alcohol use: Yes    Comment: 2 drinks daily - 2 beers max  . Drug use: No   Current Outpatient Medications  Medication Sig Dispense Refill  . famotidine (PEPCID) 20 MG tablet Take 20 mg by mouth daily as needed for heartburn or indigestion.     . hyoscyamine (LEVSIN, ANASPAZ) 0.125 MG tablet Take 1 tablet (0.125 mg total) by mouth every 4 (four) hours as needed for cramping. 30 tablet 3  . ibuprofen (ADVIL,MOTRIN) 200 MG tablet Take 200 mg by mouth every 6 (six) hours as needed.    Marland Kitchen omeprazole (PRILOSEC) 20 MG capsule Take 20 mg  by mouth daily.    . ondansetron (ZOFRAN) 4 MG tablet Take 1 tablet (4 mg total) by mouth every 8 (eight) hours as needed for nausea or vomiting. 12 tablet 0  . ranitidine (ZANTAC) 150 MG capsule Take 150 mg by mouth 2 (two) times daily as needed for heartburn.      No current facility-administered medications for this visit.    Allergies  Allergen Reactions  . Almond Oil Bitter Flavor [Flavoring Agent]   . Milk-Related Compounds      Review of Systems: All systems reviewed and negative except where noted in HPI.   Lab Results  Component Value Date   WBC 4.1 10/16/2014   HGB 14.5 10/16/2014   HCT 41.0 10/16/2014   MCV 88.2 10/16/2014   PLT 259 10/16/2014    Lab Results  Component Value Date   CREATININE 0.90 10/23/2016   BUN 10 10/23/2016   NA 142 10/23/2016   K 4.4 10/23/2016   CL 101 10/23/2016   CO2 22  10/23/2016    Lab Results  Component Value Date   ALT 17 10/23/2016   AST 24 10/23/2016   ALKPHOS 64 10/23/2016   BILITOT 0.5 10/23/2016     Physical Exam: BP 108/68   Pulse 64   Ht 5\' 11"  (1.803 m)   Wt 184 lb 2 oz (83.5 kg)   BMI 25.68 kg/m  Constitutional: Pleasant,well-developed, male in no acute distress. HEENT: Normocephalic and atraumatic. Conjunctivae are normal. No scleral icterus. Neck supple.  Cardiovascular: Normal rate, regular rhythm.  Pulmonary/chest: Effort normal and breath sounds normal. No wheezing, rales or rhonchi. Abdominal: Soft, nondistended, nontender. There are no masses palpable. No hepatomegaly. Extremities: no edema Lymphadenopathy: No cervical adenopathy noted. Neurological: Alert and oriented to person place and time. Skin: Skin is warm and dry. No rashes noted. Psychiatric: Normal mood and affect. Behavior is normal.   ASSESSMENT AND PLAN: 47 year old male here for reassessment of following issues:  Eosinophilic esophagitis -overall doing pretty well managed mostly with diet at this point, avoiding dairy and milk and other known allergens, as well as taking omeprazole 20 mg once daily.  He really does not have much of any persistent dysphagia at this point.  For his intermittent discomfort he can try taking some Gaviscon or Maalox as needed to see if this helps.  If he notes his dysphasia is recurring or progressing he should contact me and would consider a repeat endoscopy, however long as he is feeling well I do not feel the need to pursue that at this time.  He agreed and can follow-up as needed for this.  IBS-D -long-standing symptoms using Imodium periodically but would like to try something else.  I gave him a sample pack for Viberzi to see if this will help.  Given his symptoms are somewhat mild will use a lower dose at 75 mg/day.  I will otherwise send celiac serologies to ensure negative as he never had this done.  He will be due for  colonoscopy at age 71 for screening purposes, we will consider it sooner if his symptoms persist and do not respond to therapy.  He agreed  [place] Cellar, MD [facility] Gastroenterology Pager [phone]

## 2017-05-18 NOTE — Patient Instructions (Addendum)
  Your physician has requested that you go to the basement for the following lab work before leaving today: TTG, IGA   We have sent the following medications to your pharmacy for you to pick up at your convenience: Omeprazole   Today we are giving you samples of Viberzi to try.  Call and let us know if it is helping.    I appreciate the opportunity to care for you. [place] Cellar MD

## 2017-05-19 LAB — TISSUE TRANSGLUTAMINASE, IGA: (tTG) Ab, IgA: 1 U/mL

## 2017-05-31 ENCOUNTER — Encounter: Payer: Self-pay | Admitting: Gastroenterology

## 2017-07-05 IMAGING — CT CT HEART MORP W/ CTA COR W/ SCORE W/ CA W/CM &/OR W/O CM
1 of 10 series · 1 of 20 positions shown, 2 images · IV contrast (Iodine)
Comparison: None.

EXAM:
OVER-READ INTERPRETATION  CT CHEST

The following report is an over-read performed by radiologist Dr.
over-read does not include interpretation of cardiac or coronary
anatomy or pathology. The coronary CTA interpretation by the
cardiologist is attached.
CLINICAL DATA: Chest pain
Cardiac CTA
MEDICATIONS:
Sub lingual nitro. 4mg and lopressor 5mg
TECHNIQUE: The patient was scanned on a [employer] [REDACTED]ice scanner. Gantry
rotation speed was 270 msecs. Collimation was .9mm. A 100 kV
prospective scan was triggered in the descending thoracic aorta at
111 HU's with 5% padding centered around 78% of the R-R interval.
Average HR during the scan was 68 bpm. The 3D data set was
interpreted on a dedicated work station using MPR, MIP and VRT
modes. A total of 80cc of contrast was used.

[Series 300: locator · axial · 0.35mm/px · z∈[-198,-198]mm · 1 of 1 slices shown, 2 images]
[im 1/1  vessel]
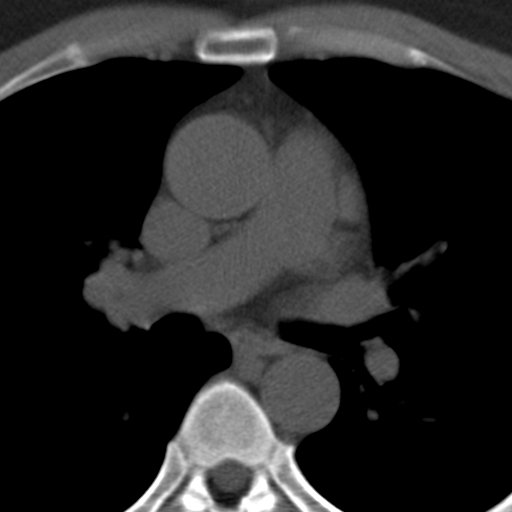
[im 1/1  lung]
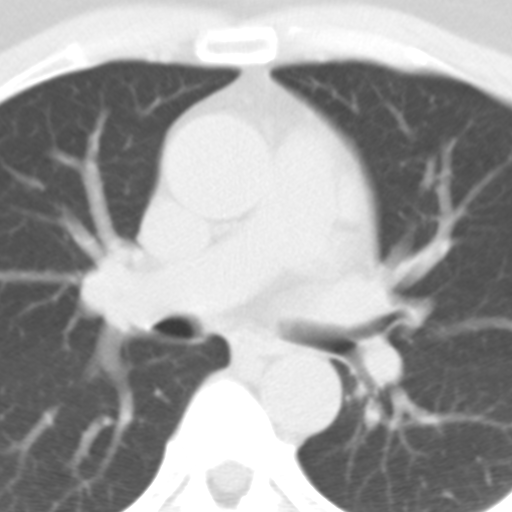

[1 of 20 positions shown; findings below may reference images not displayed]

FINDINGS: Limited view of the lung parenchyma demonstrates no suspicious
nodularity. Airways are normal.

Limited view of the mediastinum demonstrates no adenopathy.
Esophagus normal.

Limited view of the upper abdomen unremarkable.

Limited view of the skeleton and chest wall is unremarkable.
IMPRESSION: No significant extracardiac findings.
FINDINGS: Non-cardiac: See separate report from [REDACTED]. No
significant findings on limited lung and soft tissue windows.

Calcium Score: 0

Coronary Arteries: Right dominant with no anomalies

LM: Normal

LAD:  Normal

D1: Normal

D2: Normal

Circumflex:  Left dominant normal

OM1: Normal

OM2: Normal

RCA:  Non dominant and normal
IMPRESSION: 1) Normal left dominant coronary arteries

2) Calcium score 0

Ashgly Burgos-Raddatz

## 2017-09-23 MED FILL — OMEPRAZOLE 20 MG CAP: 20 | 90 days supply | Qty: 90 | Fill #1

## 2017-10-26 ENCOUNTER — Encounter: Payer: 59 | Admitting: Family Medicine

## 2017-11-02 ENCOUNTER — Other Ambulatory Visit: Payer: Self-pay

## 2017-11-02 ENCOUNTER — Ambulatory Visit (INDEPENDENT_AMBULATORY_CARE_PROVIDER_SITE_OTHER): Payer: 59 | Admitting: Family Medicine

## 2017-11-02 ENCOUNTER — Encounter: Payer: Self-pay | Admitting: Family Medicine

## 2017-11-02 VITALS — BP 130/80 | HR 77 | Temp 98.4°F | Ht 71.0 in | Wt 183.0 lb

## 2017-11-02 DIAGNOSIS — F32A Depression, unspecified: Secondary | ICD-10-CM

## 2017-11-02 DIAGNOSIS — Z114 Encounter for screening for human immunodeficiency virus [HIV]: Secondary | ICD-10-CM | POA: Diagnosis not present

## 2017-11-02 DIAGNOSIS — Z131 Encounter for screening for diabetes mellitus: Secondary | ICD-10-CM

## 2017-11-02 DIAGNOSIS — Z1329 Encounter for screening for other suspected endocrine disorder: Secondary | ICD-10-CM

## 2017-11-02 DIAGNOSIS — Z0000 Encounter for general adult medical examination without abnormal findings: Secondary | ICD-10-CM | POA: Diagnosis not present

## 2017-11-02 DIAGNOSIS — F329 Major depressive disorder, single episode, unspecified: Secondary | ICD-10-CM | POA: Diagnosis not present

## 2017-11-02 DIAGNOSIS — Z13220 Encounter for screening for lipoid disorders: Secondary | ICD-10-CM

## 2017-11-02 DIAGNOSIS — K589 Irritable bowel syndrome without diarrhea: Secondary | ICD-10-CM

## 2017-11-02 MED ORDER — ESCITALOPRAM OXALATE 10 MG PO TABS: 10.0000 mg | ORAL_TABLET | Freq: Every day | ORAL | 2 refills | Status: DC

## 2017-11-02 MED FILL — ESCITALOPRAM 10 MG TABLET: 10 | 30 days supply | Qty: 30 | Fill #0

## 2017-11-02 NOTE — Patient Instructions (Addendum)
Keep follow up with therapist, but would also recommend starting lexapro once per day. Decrease alcohol intake and keep up the exercise most days per week if possible as that can also help stress and depression symptoms.  Recheck in 3-4 weeks.   I will check some bloodwork as discussed. Thanks for coming in today.   Keeping you healthy  Get these tests  Blood pressure- Have your blood pressure checked once a year by your healthcare provider.  Normal blood pressure is 120/80.  Weight- Have your body mass index (BMI) calculated to screen for obesity.  BMI is a measure of body fat based on height and weight. You can also calculate your own BMI at [email or web address].it.  Cholesterol- Have your cholesterol checked regularly starting at age 51, sooner may be necessary if you have diabetes, high blood pressure, if a family member developed heart diseases at an early age or if you smoke.   Chlamydia, HIV, and other sexual transmitted disease- Get screened each year until the age of 8 then within three months of each new sexual partner.  Diabetes- Have your blood sugar checked regularly if you have high blood pressure, high cholesterol, a family history of diabetes or if you are overweight.  Get these vaccines  Flu shot- Every fall.  Tetanus shot- Every 10 years.  Menactra- Single dose; prevents meningitis.  Take these steps  Don't smoke- If you do smoke, ask your healthcare provider about quitting. For tips on how to quit, go to www.smokefree.gov or call 1-800-QUIT-NOW.  Be physically active- Exercise 5 days a week for at least 30 minutes.  If you are not already physically active start slow and gradually work up to 30 minutes of moderate physical activity.  Examples of moderate activity include walking briskly, mowing the yard, dancing, swimming bicycling, etc.  Eat a healthy diet- Eat a variety of healthy foods such as fruits, vegetables, low fat milk, low fat cheese, yogurt, lean  meats, poultry, fish, beans, tofu, etc.  For more information on healthy eating, go to www.thenutritionsource.org  Drink alcohol in moderation- Limit alcohol intake two drinks or less a day.  Never drink and drive.  Dentist- Brush and floss teeth twice daily; visit your dentis twice a year.  Depression-Your emotional health is as important as your physical health.  If you're feeling down, losing interest in things you normally enjoy please talk with your healthcare provider.  Gun Safety- If you keep a gun in your home, keep it unloaded and with the safety lock on.  Bullets should be stored separately.  Helmet use- Always wear a helmet when riding a motorcycle, bicycle, rollerblading or skateboarding.  Safe sex- If you may be exposed to a sexually transmitted infection, use a condom  Seat belts- Seat bels can save your life; always wear one.  Smoke/Carbon Monoxide detectors- These detectors need to be installed on the appropriate level of your home.  Replace batteries at least once a year.  Skin Cancer- When out in the sun, cover up and use sunscreen SPF 15 or higher.  Violence- If anyone is threatening or hurting you, please tell your healthcare provider. Major Depressive Disorder, Adult Major depressive disorder (MDD) is a mental health condition. It may also be called clinical depression or unipolar depression. MDD usually causes feelings of sadness, hopelessness, or helplessness. MDD can also cause physical symptoms. It can interfere with work, school, relationships, and other everyday activities. MDD may be mild, moderate, or severe. It may occur once (single episode  major depressive disorder) or it may occur multiple times (recurrent major depressive disorder). What are the causes? The exact cause of this condition is not known. MDD is most likely caused by a combination of things, which may include:  Genetic factors. These are traits that are passed along from parent to  child.  Individual factors. Your personality, your behavior, and the way you handle your thoughts and feelings may contribute to MDD. This includes personality traits and behaviors learned from others.  Physical factors, such as: ? Differences in the part of your brain that controls emotion. This part of your brain may be different than it is in people who do not have MDD. ? Long-term (chronic) medical or psychiatric illnesses.  Social factors. Traumatic experiences or major life changes may play a role in the development of MDD.  What increases the risk? This condition is more likely to develop in women. The following factors may also make you more likely to develop MDD:  A family history of depression.  Troubled family relationships.  Abnormally low levels of certain brain chemicals.  Traumatic events in childhood, especially abuse or the loss of a parent.  Being under a lot of stress, or long-term stress, especially from upsetting life experiences or losses.  A history of: ? Chronic physical illness. ? Other mental health disorders. ? Substance abuse.  Poor living conditions.  Experiencing social exclusion or discrimination on a regular basis.  What are the signs or symptoms? The main symptoms of MDD typically include:  Constant depressed or irritable mood.  Loss of interest in things and activities.  MDD symptoms may also include:  Sleeping or eating too much or too little.  Unexplained weight change.  Fatigue or low energy.  Feelings of worthlessness or guilt.  Difficulty thinking clearly or making decisions.  Thoughts of suicide or of harming others.  Physical agitation or weakness.  Isolation.  Severe cases of MDD may also occur with other symptoms, such as:  Delusions or hallucinations, in which you imagine things that are not real (psychotic depression).  Low-level depression that lasts at least a year (chronic depression or persistent depressive  disorder).  Extreme sadness and hopelessness (melancholic depression).  Trouble speaking and moving (catatonic depression).  How is this diagnosed? This condition may be diagnosed based on:  Your symptoms.  Your medical history, including your mental health history. This may involve tests to evaluate your mental health. You may be asked questions about your lifestyle, including any drug and alcohol use, and how long you have had symptoms of MDD.  A physical exam.  Blood tests to rule out other conditions.  You must have a depressed mood and at least four other MDD symptoms most of the day, nearly every day in the same 2-week timeframe before your health care provider can confirm a diagnosis of MDD. How is this treated? This condition is usually treated by mental health professionals, such as psychologists, psychiatrists, and clinical social workers. You may need more than one type of treatment. Treatment may include:  Psychotherapy. This is also called talk therapy or counseling. Types of psychotherapy include: ? Cognitive behavioral therapy (CBT). This type of therapy teaches you to recognize unhealthy feelings, thoughts, and behaviors, and replace them with positive thoughts and actions. ? Interpersonal therapy (IPT). This helps you to improve the way you relate to and communicate with others. ? Family therapy. This treatment includes members of your family.  Medicine to treat anxiety and depression, or to help  you control certain emotions and behaviors.  Lifestyle changes, such as: ? Limiting alcohol and drug use. ? Exercising regularly. ? Getting plenty of sleep. ? Making healthy eating choices. ? Spending more time outdoors.  Treatments involving stimulation of the brain can be used in situations with extremely severe symptoms, or when medicine or other therapies do not work over time. These treatments include electroconvulsive therapy, transcranial magnetic stimulation, and  vagal nerve stimulation. Follow these instructions at home: Activity  Return to your normal activities as told by your health care provider.  Exercise regularly and spend time outdoors as told by your health care provider. General instructions  Take over-the-counter and prescription medicines only as told by your health care provider.  Do not drink alcohol. If you drink alcohol, limit your alcohol intake to no more than 1 drink a day for nonpregnant women and 2 drinks a day for men. One drink equals 12 oz of beer, 5 oz of wine, or 1 oz of hard liquor. Alcohol can affect any antidepressant medicines you are taking. Talk to your health care provider about your alcohol use.  Eat a healthy diet and get plenty of sleep.  Find activities that you enjoy doing, and make time to do them.  Consider joining a support group. Your health care provider may be able to recommend a support group.  Keep all follow-up visits as told by your health care provider. This is important. Where to find more information: [employer] on Mental Illness  www.nami.org  [place] National Institute of Mental Health  https://carter.com/  National Suicide Prevention Lifeline  1-800-273-TALK (469)882-7071). This is free, 24-hour help.  Contact a health care provider if:  Your symptoms get worse.  You develop new symptoms. Get help right away if:  You self-harm.  You have serious thoughts about hurting yourself or others.  You see, hear, taste, smell, or feel things that are not present (hallucinate). This information is not intended to replace advice given to you by your health care provider. Make sure you discuss any questions you have with your health care provider. Document Released: 07/19/2012 Document Revised: 11/29/2015 Document Reviewed: 10/03/2015 Elsevier Interactive Patient Education  2018 [employer].   IF you received an x-ray today, you will receive an invoice from [facility]. Please  contact [facility] at [phone] with questions or concerns regarding your invoice.   IF you received labwork today, you will receive an invoice from [facility]. Please contact [facility] at [phone] with questions or concerns regarding your invoice.   Our billing staff will not be able to assist you with questions regarding bills from these companies.  You will be contacted with the lab results as soon as they are available. The fastest way to get your results is to activate your My Chart account. Instructions are located on the last page of this paperwork. If you have not heard from [place] regarding the results in 2 weeks, please contact this office.

## 2017-11-02 NOTE — Progress Notes (Signed)
Subjective:  By signing my name below, I, Delton Coombes, attest that this documentation has been prepared under the direction and in the presence of [name], MD Electronically Signed: Delton Coombes Medical Scribe 11/02/2017 at 9:21 AM   Patient ID: Darren Best, male    DOB: 10/15/1970, 47 y.o.   MRN: [id number] Chief Complaint  Patient presents with  . Annual Exam    CPE   . Depression    PHQ9-6- Due to life situations   HPI Darren Best is a 47 y.o. male who presents to Primary Care at [facility] here for a physical. Patient has a history if Irritable Bowel Syndrome as well as esophagitis followed by Luvenia Starch, last office visit in February. He was started [employer].   IBS and eosinophilic esophagitis GI is Dr. Havery Moros, last office visit was in February. He is Omeprazole everyday and reports being better after changing some foods from his diet like tomatoes.   CA screening Prostate cancer screening: Patient will wait for PSA exam at this time.  Immunizations  Immunization History  Administered Date(s) Administered  . Influenza, Seasonal, Injecte, Preservative Fre 01/01/2015, 01/18/2016  . Tdap 04/26/2012   HIV Screening: agrees to screen, no known risk factors.   Depression Screening Depression screen [facility] 2/9 11/02/2017 10/23/2016 10/18/2015 10/16/2014 10/10/2013  Decreased Interest 1 0 0 0 0  Down, Depressed, Hopeless 1 0 0 0 0  PHQ - 2 Score 2 0 0 0 0  Altered sleeping 1 - - - -  Tired, decreased energy 1 - - - -  Change in appetite 0 - - - -  Feeling bad or failure about yourself  1 - - - -  Trouble concentrating 1 - - - -  Moving slowly or fidgety/restless 0 - - - -  Suicidal thoughts 0 - - - -  PHQ-9 Score 6 - - - -  Difficult doing work/chores Somewhat difficult - - - -   He was having some grieving after passing of his father last March. Discussed counseling and follow up if patient had more persistent of more symptoms. He reported that his brother was  complaining of back pain, which ended up being stage IV metastatic cancer, and is seeing his brother decline. He feels like he isn't doing his part in his family. He does reports feeling depressed, but declines wanting to self harm. He has met with a counselor through [facility] and has only seen them once. Counselor through [facility] recommended for him to see a psychiatrist. Patient reported his father did have depression and illness, and feels worried that he will have the same thing, and reports decreased sleep. Patient reports drinking 2-3 beers a day. He is unaware of history of bipolar disorders in family. He does report a family history of anxiety, addiction, panic attacks,   Vision Screening  Visual Acuity Screening   Right eye Left eye Both eyes  Without correction: 20/15-2 20/15-1 20/15-1  With correction:     Patient has not seen ophthalmologist in over year, but reports he has not had any problems with his vision.  Dentist Patient continues to follow with orthodontist.  Exercise Patient continues to run and bike 2 times a week, sometimes 3 times a week.  Patient Active Problem List   Diagnosis Date Noted  . Pain of left hip joint 12/02/2016  . Eosinophilic esophagitis 45/06/8880  . IBS (irritable bowel syndrome) 05/15/2015  . Cyst, eyelid sebaceous 03/30/2013   Past Medical History:  Diagnosis Date  . Allergy   . Dysphagia   . Eosinophilic esophagitis   . GERD (gastroesophageal reflux disease)   . History of shingles   . Irritable bowel syndrome    Past Surgical History:  Procedure Laterality Date  . EYE SURGERY    . lasix surgery     Allergies  Allergen Reactions  . Almond Oil Bitter Flavor [Flavoring Agent]   . Milk-Related Compounds    Prior to Admission medications   Medication Sig Start Date End Date Taking? Authorizing Provider  Eluxadoline (VIBERZI) 75 MG TABS Take 75 mg by mouth 2 (two) times daily. Lot# [id number], exp, 06/05/2018 05/18/17   Armbruster, Carlota Raspberry, MD    famotidine (PEPCID) 20 MG tablet Take 20 mg by mouth daily as needed for heartburn or indigestion.     [provider]  hyoscyamine (LEVSIN, ANASPAZ) 0.125 MG tablet Take 1 tablet (0.125 mg total) by mouth every 4 (four) hours as needed for cramping. 05/15/15   Armbruster, Carlota Raspberry, MD  ibuprofen (ADVIL,MOTRIN) 200 MG tablet Take 200 mg by mouth every 6 (six) hours as needed.    [provider]  omeprazole (PRILOSEC) 20 MG capsule Take 1 capsule (20 mg total) by mouth daily. 05/18/17   Armbruster, Carlota Raspberry, MD  ondansetron (ZOFRAN) 4 MG tablet Take 1 tablet (4 mg total) by mouth every 8 (eight) hours as needed for nausea or vomiting. 08/03/16   [name], NP  ranitidine (ZANTAC) 150 MG capsule Take 150 mg by mouth 2 (two) times daily as needed for heartburn.     [provider]   Social History   Socioeconomic History  . Marital status: Married    Spouse name: Not on file  . Number of children: 0  . Years of education: Not on file  . Highest education level: Not on file  Occupational History  . Occupation: Product manager: [employer]/ [employer]  . Financial resource strain: Not on file  . Food insecurity:    Worry: Not on file    Inability: Not on file  . Transportation needs:    Medical: Not on file    Non-medical: Not on file  Tobacco Use  . Smoking status: Former Research scientist (life sciences)  . Smokeless tobacco: Never Used  Substance and Sexual Activity  . Alcohol use: Yes    Comment: 2 drinks daily - 2 beers max  . Drug use: No  . Sexual activity: Yes    Comment: number of sex partners in the last 12 months 1  Lifestyle  . Physical activity:    Days per week: Not on file    Minutes per session: Not on file  . Stress: Not on file  Relationships  . Social connections:    Talks on phone: Not on file    Gets together: Not on file    Attends religious service: Not on file    Active member of club or organization: Not on file     Attends meetings of clubs or organizations: Not on file    Relationship status: Not on file  . Intimate partner violence:    Fear of current or ex partner: Not on file    Emotionally abused: Not on file    Physically abused: Not on file    Forced sexual activity: Not on file  Other Topics Concern  . Not on file  Social History Narrative   College degree  [employer]   2-3 caffeine drinks daily   Exercise cycling and gym classes 2-3 times per week for 1-2 hours      Review of Systems  Psychiatric/Behavioral:       Decreased concentration and decreased interest.      Objective:   Physical Exam  Constitutional: He is oriented to person, place, and time. He appears well-developed and well-nourished.  HENT:  Head: Normocephalic and atraumatic.  Right Ear: External ear normal.  Left Ear: External ear normal.  Mouth/Throat: Oropharynx is clear and moist.  Eyes: Pupils are equal, round, and reactive to light. Conjunctivae and EOM are normal.  Neck: Normal range of motion. Neck supple. No thyromegaly present.  Cardiovascular: Normal rate, regular rhythm, normal heart sounds and intact distal pulses.  Pulmonary/Chest: Effort normal and breath sounds normal. No respiratory distress. He has no wheezes.  Abdominal: Soft. He exhibits no distension. There is no tenderness.  Musculoskeletal: Normal range of motion. He exhibits no edema or tenderness.  Lymphadenopathy:    He has no cervical adenopathy.  Neurological: He is alert and oriented to person, place, and time. He has normal reflexes.  Skin: Skin is warm and dry.  Psychiatric: He has a normal mood and affect. His behavior is normal.  Vitals reviewed.  Vitals:   11/02/17 0822  BP: 130/80  Pulse: 77  Temp: 98.4 F (36.9 C)  TempSrc: Oral  SpO2: 100%  Weight: 183 lb (83 kg)  Height: '5\' 11"'$  (1.803 m)      Assessment & Plan:    Darren Best is a 47 y.o. male Annual physical exam  -anticipatory guidance as below in  AVS, screening labs above. Health maintenance items as above in HPI discussed/recommended as applicable.   Screening for HIV (human immunodeficiency virus) - Plan: HIV antibody  Depression, unspecified depression type - Plan: TSH, escitalopram (LEXAPRO) 10 MG tablet  - situational with prolonged grief past 1 year, but complicated by terminal illness of his brother.   -start lexapro, continue counseling through [facility], continue exercise, with goal of most days per week, and decrease alcohol use.   - recheck in 3-4 weeks.   Screening for thyroid disorder  - check tsh  Screening for diabetes mellitus - Plan: Comprehensive metabolic panel  Screening for hyperlipidemia - Plan: Lipid panel  Irritable bowel syndrome, unspecified type  - stable on current regimen and avoiding trigger foods. Continue same regimen and ongoing care with his gastroenterologist.   Meds ordered this encounter  Medications  . escitalopram (LEXAPRO) 10 MG tablet    Sig: Take 1 tablet (10 mg total) by mouth daily.    Dispense:  30 tablet    Refill:  2   Patient Instructions   Keep follow up with therapist, but would also recommend starting lexapro once per day. Decrease alcohol intake and keep up the exercise most days per week if possible as that can also help stress and depression symptoms.  Recheck in 3-4 weeks.   I will check some bloodwork as discussed. Thanks for coming in today.   Keeping you healthy  Get these tests  Blood pressure- Have your blood pressure checked once a year by your healthcare provider.  Normal blood pressure is 120/80.  Weight- Have your body mass index (BMI) calculated to screen for obesity.  BMI is a measure of body fat based on height and weight. You can also calculate your own BMI at GravelBags.it.  Cholesterol- Have your cholesterol checked regularly starting at  age 9, sooner may be necessary if you have diabetes, high blood pressure, if a family member developed  heart diseases at an early age or if you smoke.   Chlamydia, HIV, and other sexual transmitted disease- Get screened each year until the age of 15 then within three months of each new sexual partner.  Diabetes- Have your blood sugar checked regularly if you have high blood pressure, high cholesterol, a family history of diabetes or if you are overweight.  Get these vaccines  Flu shot- Every fall.  Tetanus shot- Every 10 years.  Menactra- Single dose; prevents meningitis.  Take these steps  Don't smoke- If you do smoke, ask your healthcare provider about quitting. For tips on how to quit, go to www.smokefree.gov or call 1-800-QUIT-NOW.  Be physically active- Exercise 5 days a week for at least 30 minutes.  If you are not already physically active start slow and gradually work up to 30 minutes of moderate physical activity.  Examples of moderate activity include walking briskly, mowing the yard, dancing, swimming bicycling, etc.  Eat a healthy diet- Eat a variety of healthy foods such as fruits, vegetables, low fat milk, low fat cheese, yogurt, lean meats, poultry, fish, beans, tofu, etc.  For more information on healthy eating, go to www.thenutritionsource.org  Drink alcohol in moderation- Limit alcohol intake two drinks or less a day.  Never drink and drive.  Dentist- Brush and floss teeth twice daily; visit your dentis twice a year.  Depression-Your emotional health is as important as your physical health.  If you're feeling down, losing interest in things you normally enjoy please talk with your healthcare provider.  Gun Safety- If you keep a gun in your home, keep it unloaded and with the safety lock on.  Bullets should be stored separately.  Helmet use- Always wear a helmet when riding a motorcycle, bicycle, rollerblading or skateboarding.  Safe sex- If you may be exposed to a sexually transmitted infection, use a condom  Seat belts- Seat bels can save your life; always wear  one.  Smoke/Carbon Monoxide detectors- These detectors need to be installed on the appropriate level of your home.  Replace batteries at least once a year.  Skin Cancer- When out in the sun, cover up and use sunscreen SPF 15 or higher.  Violence- If anyone is threatening or hurting you, please tell your healthcare provider. Major Depressive Disorder, Adult Major depressive disorder (MDD) is a mental health condition. It may also be called clinical depression or unipolar depression. MDD usually causes feelings of sadness, hopelessness, or helplessness. MDD can also cause physical symptoms. It can interfere with work, school, relationships, and other everyday activities. MDD may be mild, moderate, or severe. It may occur once (single episode major depressive disorder) or it may occur multiple times (recurrent major depressive disorder). What are the causes? The exact cause of this condition is not known. MDD is most likely caused by a combination of things, which may include:  Genetic factors. These are traits that are passed along from parent to child.  Individual factors. Your personality, your behavior, and the way you handle your thoughts and feelings may contribute to MDD. This includes personality traits and behaviors learned from others.  Physical factors, such as: ? Differences in the part of your brain that controls emotion. This part of your brain may be different than it is in people who do not have MDD. ? Long-term (chronic) medical or psychiatric illnesses.  Social factors. Traumatic experiences or  major life changes may play a role in the development of MDD.  What increases the risk? This condition is more likely to develop in women. The following factors may also make you more likely to develop MDD:  A family history of depression.  Troubled family relationships.  Abnormally low levels of certain brain chemicals.  Traumatic events in childhood, especially abuse or the loss  of a parent.  Being under a lot of stress, or long-term stress, especially from upsetting life experiences or losses.  A history of: ? Chronic physical illness. ? Other mental health disorders. ? Substance abuse.  Poor living conditions.  Experiencing social exclusion or discrimination on a regular basis.  What are the signs or symptoms? The main symptoms of MDD typically include:  Constant depressed or irritable mood.  Loss of interest in things and activities.  MDD symptoms may also include:  Sleeping or eating too much or too little.  Unexplained weight change.  Fatigue or low energy.  Feelings of worthlessness or guilt.  Difficulty thinking clearly or making decisions.  Thoughts of suicide or of harming others.  Physical agitation or weakness.  Isolation.  Severe cases of MDD may also occur with other symptoms, such as:  Delusions or hallucinations, in which you imagine things that are not real (psychotic depression).  Low-level depression that lasts at least a year (chronic depression or persistent depressive disorder).  Extreme sadness and hopelessness (melancholic depression).  Trouble speaking and moving (catatonic depression).  How is this diagnosed? This condition may be diagnosed based on:  Your symptoms.  Your medical history, including your mental health history. This may involve tests to evaluate your mental health. You may be asked questions about your lifestyle, including any drug and alcohol use, and how long you have had symptoms of MDD.  A physical exam.  Blood tests to rule out other conditions.  You must have a depressed mood and at least four other MDD symptoms most of the day, nearly every day in the same 2-week timeframe before your health care provider can confirm a diagnosis of MDD. How is this treated? This condition is usually treated by mental health professionals, such as psychologists, psychiatrists, and clinical social  workers. You may need more than one type of treatment. Treatment may include:  Psychotherapy. This is also called talk therapy or counseling. Types of psychotherapy include: ? Cognitive behavioral therapy (CBT). This type of therapy teaches you to recognize unhealthy feelings, thoughts, and behaviors, and replace them with positive thoughts and actions. ? Interpersonal therapy (IPT). This helps you to improve the way you relate to and communicate with others. ? Family therapy. This treatment includes members of your family.  Medicine to treat anxiety and depression, or to help you control certain emotions and behaviors.  Lifestyle changes, such as: ? Limiting alcohol and drug use. ? Exercising regularly. ? Getting plenty of sleep. ? Making healthy eating choices. ? Spending more time outdoors.  Treatments involving stimulation of the brain can be used in situations with extremely severe symptoms, or when medicine or other therapies do not work over time. These treatments include electroconvulsive therapy, transcranial magnetic stimulation, and vagal nerve stimulation. Follow these instructions at home: Activity  Return to your normal activities as told by your health care provider.  Exercise regularly and spend time outdoors as told by your health care provider. General instructions  Take over-the-counter and prescription medicines only as told by your health care provider.  Do not drink alcohol. If  you drink alcohol, limit your alcohol intake to no more than 1 drink a day for nonpregnant women and 2 drinks a day for men. One drink equals 12 oz of beer, 5 oz of wine, or 1 oz of hard liquor. Alcohol can affect any antidepressant medicines you are taking. Talk to your health care provider about your alcohol use.  Eat a healthy diet and get plenty of sleep.  Find activities that you enjoy doing, and make time to do them.  Consider joining a support group. Your health care provider may  be able to recommend a support group.  Keep all follow-up visits as told by your health care provider. This is important. Where to find more information: [employer] on Mental Illness  www.nami.org  U.S. National Institute of Mental Health  https://carter.com/  National Suicide Prevention Lifeline  1-800-273-TALK 808-338-8684). This is free, 24-hour help.  Contact a health care provider if:  Your symptoms get worse.  You develop new symptoms. Get help right away if:  You self-harm.  You have serious thoughts about hurting yourself or others.  You see, hear, taste, smell, or feel things that are not present (hallucinate). This information is not intended to replace advice given to you by your health care provider. Make sure you discuss any questions you have with your health care provider. Document Released: 07/19/2012 Document Revised: 11/29/2015 Document Reviewed: 10/03/2015 Elsevier Interactive Patient Education  2018 [employer].   IF you received an x-ray today, you will receive an invoice from [facility]. Please contact [facility] at [phone] with questions or concerns regarding your invoice.   IF you received labwork today, you will receive an invoice from [place]. Please contact [facility] at [phone] with questions or concerns regarding your invoice.   Our billing staff will not be able to assist you with questions regarding bills from these companies.  You will be contacted with the lab results as soon as they are available. The fastest way to get your results is to activate your My Chart account. Instructions are located on the last page of this paperwork. If you have not heard from [place] regarding the results in 2 weeks, please contact this office.       I personally performed the services described in this documentation, which was scribed in my presence. The recorded information has been reviewed and considered for accuracy and  completeness, addended by me as needed, and agree with information above.  Signed,   [name], MD [facility].  11/02/17 10:01 AM

## 2017-11-03 LAB — LIPID PANEL
CHOLESTEROL TOTAL: 210 mg/dL — AB (ref 100–199)
Chol/HDL Ratio: 3.3 ratio (ref 0.0–5.0)
HDL: 63 mg/dL (ref >39)
LDL Calculated: 132 mg/dL — ABNORMAL HIGH (ref 0–99)
Triglycerides: 73 mg/dL (ref 0–149)
VLDL Cholesterol Cal: 15 mg/dL (ref 5–40)

## 2017-11-03 LAB — COMPREHENSIVE METABOLIC PANEL WITH GFR
ALT: 13 IU/L (ref 0–44)
AST: 22 IU/L (ref 0–40)
Albumin/Globulin Ratio: 1.8 (ref 1.2–2.2)
Albumin: 4.8 g/dL (ref 3.5–5.5)
Alkaline Phosphatase: 64 IU/L (ref 39–117)
BUN/Creatinine Ratio: 11 (ref 9–20)
BUN: 11 mg/dL (ref 6–24)
Bilirubin Total: 0.6 mg/dL (ref 0.0–1.2)
CO2: 24 mmol/L (ref 20–29)
Calcium: 9.6 mg/dL (ref 8.7–10.2)
Chloride: 103 mmol/L (ref 96–106)
Creatinine, Ser: 1.01 mg/dL (ref 0.76–1.27)
GFR calc Af Amer: 101 mL/min/1.73 (ref >59)
GFR calc non Af Amer: 88 mL/min/1.73 (ref >59)
Globulin, Total: 2.6 g/dL (ref 1.5–4.5)
Glucose: 92 mg/dL (ref 65–99)
Potassium: 4.8 mmol/L (ref 3.5–5.2)
Sodium: 142 mmol/L (ref 134–144)
Total Protein: 7.4 g/dL (ref 6.0–8.5)

## 2017-11-03 LAB — HIV ANTIBODY (ROUTINE TESTING W REFLEX): HIV Screen 4th Generation wRfx: NONREACTIVE

## 2017-11-03 LAB — TSH: TSH: 1.98 u[IU]/mL (ref 0.450–4.500)

## 2017-11-30 ENCOUNTER — Other Ambulatory Visit: Payer: Self-pay

## 2017-11-30 ENCOUNTER — Encounter: Payer: Self-pay | Admitting: Family Medicine

## 2017-11-30 ENCOUNTER — Ambulatory Visit (INDEPENDENT_AMBULATORY_CARE_PROVIDER_SITE_OTHER): Payer: 59 | Admitting: Family Medicine

## 2017-11-30 VITALS — BP 122/78 | HR 51 | Temp 98.3°F | Ht 71.0 in | Wt 179.0 lb

## 2017-11-30 DIAGNOSIS — F329 Major depressive disorder, single episode, unspecified: Secondary | ICD-10-CM

## 2017-11-30 DIAGNOSIS — K580 Irritable bowel syndrome with diarrhea: Secondary | ICD-10-CM

## 2017-11-30 DIAGNOSIS — R5383 Other fatigue: Secondary | ICD-10-CM | POA: Diagnosis not present

## 2017-11-30 DIAGNOSIS — R197 Diarrhea, unspecified: Secondary | ICD-10-CM

## 2017-11-30 DIAGNOSIS — F32A Depression, unspecified: Secondary | ICD-10-CM

## 2017-11-30 LAB — CBC
HEMOGLOBIN: 14.1 g/dL (ref 13.0–17.7)
Hematocrit: 42.2 % (ref 37.5–51.0)
MCH: 30.7 pg (ref 26.6–33.0)
MCHC: 33.4 g/dL (ref 31.5–35.7)
MCV: 92 fL (ref 79–97)
Platelets: 228 10*3/uL (ref 150–450)
RBC: 4.59 x10E6/uL (ref 4.14–5.80)
RDW: 12.8 % (ref 12.3–15.4)
WBC: 3.4 10*3/uL (ref 3.4–10.8)

## 2017-11-30 MED ORDER — HYOSCYAMINE SULFATE 0.125 MG PO TABS: 0.1250 mg | ORAL_TABLET | ORAL | 1 refills | Status: DC | PRN

## 2017-11-30 MED ORDER — ESCITALOPRAM OXALATE 10 MG PO TABS: 10.0000 mg | ORAL_TABLET | Freq: Every day | ORAL | 2 refills | Status: DC

## 2017-11-30 MED FILL — OSCIMIN 0.125 MG TABLET: 0.125 | 8 days supply | Qty: 30 | Fill #0

## 2017-11-30 MED FILL — ESCITALOPRAM 10 MG TABLET: 10 | 30 days supply | Qty: 30 | Fill #0

## 2017-11-30 NOTE — Progress Notes (Addendum)
Subjective:  By signing my name below, I, Darren Best, attest that this documentation has been prepared under the direction and in the presence of Darren Agreste, MD Electronically Signed: Ladene Artist, ED Scribe 11/30/2017 at 10:00 AM.   Patient ID: Darren Best, male    DOB: 05/18/1970, 47 y.o.   MRN: [id number]  Chief Complaint  Patient presents with  . follow up from Annual  . Depression    PHQ9=5   HPI [name] "Darren Best" is a 47 y.o. male who presents to Primary Care at [facility] for f/u from CPE. He was suffering from some depression at that time. Started on lexapro 10 mg qd. Recommended continuing counseling through the EAP program, increase exercise and decrease alcohol use. - Pt states that lexapro seems to be doing okay. States that he no longer feels "hopeless" but he is very aware that his brother is dying. Pt reports that he is able to concentrate a little more at work but still has some days where he is a bit distracted at work. He noticed fatigue when he initially started lexapro which is less now. Has also noticed ~5 lb weight loss and daily diarrhea, unchanged, and he suspects may be attributed to IBS symptoms. Pt is not currently taking meds for IBS other than omeprazole for GERD. He does report 1 episode of dizziness/decreased energy accompanied with a BP reading of 80/50 and HR of 42 ~2 wks ago while standing at dinner. Denies palpitations at that time. He has noticed some decreased appetite. Pt has been drinking less alcohol. Reports that he exercises some but plans to increase physical activity. Depression screen [facility] 2/9 11/30/2017 11/02/2017 10/23/2016 10/18/2015 10/16/2014  Decreased Interest 1 1 0 0 0  Down, Depressed, Hopeless 1 1 0 0 0  PHQ - 2 Score 2 2 0 0 0  Altered sleeping 1 1 - - -  Tired, decreased energy 1 1 - - -  Change in appetite 0 0 - - -  Feeling bad or failure about yourself  0 1 - - -  Trouble concentrating 1 1 - - -  Moving slowly or  fidgety/restless 0 0 - - -  Suicidal thoughts 0 0 - - -  PHQ-9 Score 5 6 - - -  Difficult doing work/chores Somewhat difficult Somewhat difficult - - -   Insomnia Pt has noticed some sleep disturbances. States he wakes between 1am and 3 am to "think" and takes ~1 hr to return to sleep. He has tried Ambien sev yrs ago but did not like the way it made him feel. Pt has also tried melatonin which helped; plan to restart.  Patient Active Problem List   Diagnosis Date Noted  . Pain of left hip joint 12/02/2016  . Eosinophilic esophagitis 58/85/0277  . IBS (irritable bowel syndrome) 05/15/2015  . Cyst, eyelid sebaceous 03/30/2013   Past Medical History:  Diagnosis Date  . Allergy   . Dysphagia   . Eosinophilic esophagitis   . GERD (gastroesophageal reflux disease)   . History of shingles   . Irritable bowel syndrome    Past Surgical History:  Procedure Laterality Date  . EYE SURGERY    . lasix surgery     Allergies  Allergen Reactions  . Almond Oil Bitter Flavor [Flavoring Agent]   . Milk-Related Compounds    Prior to Admission medications   Medication Sig Start Date End Date Taking? Authorizing Provider  Eluxadoline (VIBERZI) 75 MG TABS Take 75 mg  by mouth 2 (two) times daily. Lot# [id number], exp, 06/05/2018 05/18/17   Armbruster, Carlota Raspberry, MD  escitalopram (LEXAPRO) 10 MG tablet Take 1 tablet (10 mg total) by mouth daily. 11/02/17   Darren Agreste, MD  famotidine (PEPCID) 20 MG tablet Take 20 mg by mouth daily as needed for heartburn or indigestion.     [provider]  hyoscyamine (LEVSIN, ANASPAZ) 0.125 MG tablet Take 1 tablet (0.125 mg total) by mouth every 4 (four) hours as needed for cramping. 05/15/15   Armbruster, Carlota Raspberry, MD  ibuprofen (ADVIL,MOTRIN) 200 MG tablet Take 200 mg by mouth every 6 (six) hours as needed.    [provider]  omeprazole (PRILOSEC) 20 MG capsule Take 1 capsule (20 mg total) by mouth daily. 05/18/17   Armbruster, Carlota Raspberry, MD    ondansetron (ZOFRAN) 4 MG tablet Take 1 tablet (4 mg total) by mouth every 8 (eight) hours as needed for nausea or vomiting. 08/03/16   [name], NP  ranitidine (ZANTAC) 150 MG capsule Take 150 mg by mouth 2 (two) times daily as needed for heartburn.     [provider]   Social History   Socioeconomic History  . Marital status: Married    Spouse name: Not on file  . Number of children: 0  . Years of education: Not on file  . Highest education level: Not on file  Occupational History  . Occupation: Product manager: [employer]/ [employer]  . Financial resource strain: Not on file  . Food insecurity:    Worry: Not on file    Inability: Not on file  . Transportation needs:    Medical: Not on file    Non-medical: Not on file  Tobacco Use  . Smoking status: Former Research scientist (life sciences)  . Smokeless tobacco: Never Used  Substance and Sexual Activity  . Alcohol use: Yes    Comment: 2 drinks daily - 2 beers max  . Drug use: No  . Sexual activity: Yes    Comment: number of sex partners in the last 12 months 1  Lifestyle  . Physical activity:    Days per week: Not on file    Minutes per session: Not on file  . Stress: Not on file  Relationships  . Social connections:    Talks on phone: Not on file    Gets together: Not on file    Attends religious service: Not on file    Active member of club or organization: Not on file    Attends meetings of clubs or organizations: Not on file    Relationship status: Not on file  . Intimate partner violence:    Fear of current or ex partner: Not on file    Emotionally abused: Not on file    Physically abused: Not on file    Forced sexual activity: Not on file  Other Topics Concern  . Not on file  Social History Narrative   College degree   [employer]   2-3 caffeine drinks daily   Exercise cycling and gym classes 2-3 times per week for 1-2 hours    Review of Systems  Constitutional: Positive for  appetite change and fatigue (improved).  Cardiovascular: Negative for palpitations.  Gastrointestinal: Positive for diarrhea (unchanged).  Neurological: Dizziness: resolved.  Psychiatric/Behavioral: Positive for dysphoric mood (improved) and sleep disturbance.      Objective:   Physical Exam  Constitutional: He is  oriented to person, place, and time. He appears well-developed and well-nourished.  HENT:  Head: Normocephalic and atraumatic.  Eyes: Pupils are equal, round, and reactive to light. EOM are normal.  Neck: No JVD present. Carotid bruit is not present.  Cardiovascular: Regular rhythm and normal heart sounds. Bradycardia present.  No murmur heard. Pulmonary/Chest: Effort normal and breath sounds normal. He has no rales.  Abdominal: Bowel sounds are normal.  Musculoskeletal: He exhibits no edema.  Neurological: He is alert and oriented to person, place, and time.  Skin: Skin is warm and dry.  Psychiatric: He has a normal mood and affect.  Vitals reviewed.  Vitals:   11/30/17 0924  BP: 122/78  Pulse: (!) 51  Temp: 98.3 F (36.8 C)  TempSrc: Oral  SpO2: 98%  Weight: 179 lb (81.2 kg)  Height: 5\' 11"  (1.803 m)    EKG: Sinus bradycardia with an occasional PAC, rate of 43.  Negative T waves in V3, borderline V2, flat T wave in lead III. no apparent significant change from May 2017. QT 456.      Assessment & Plan:    ISAMAR WELLBROCK is a 47 y.o. male Fatigue, unspecified type - Plan: CBC, EKG 12-Lead  -Possibly related to current stressors, interrupted sleep.  Will check CBC, if no concerning findings on EKG except bradycardia which he has had previously.  If worsening symptoms consider cardiology evaluation.  RTC/ER precautions given  Diarrhea, unspecified type IBS (irritable bowel syndrome) - Plan: hyoscyamine (LEVSIN, ANASPAZ) 0.125 MG tablet  -Diarrhea may be flare of IBS with current stressors versus side effect from Lexapro.  Over the next few weeks if symptoms  persist could consider changing SSRI, but refill of hyoscyamine given for now.  Can also discuss with his gastroenterologist.  Depression, unspecified depression type - Plan: escitalopram (LEXAPRO) 10 MG tablet  -Notes some improvement in symptoms.  Decided to continue Lexapro same dose, option of other SSRI as above if needed due to side effects.   Meds ordered this encounter  Medications  . hyoscyamine (LEVSIN, ANASPAZ) 0.125 MG tablet    Sig: Take 1 tablet (0.125 mg total) by mouth every 4 (four) hours as needed for cramping.    Dispense:  30 tablet    Refill:  1  . escitalopram (LEXAPRO) 10 MG tablet    Sig: Take 1 tablet (10 mg total) by mouth daily.    Dispense:  30 tablet    Refill:  2   Patient Instructions   Although diarrhea is a potential side effect of Lexapro, IBS may also be contributing with recent stressors. I will write temporary supply of hyoscamine, but would recommend discussing further with your gastroenterologist if those symptoms continue. I will continue same dose of Lexapro for now, but if the diarrhea persists, we can try a different SSRI.  Let me know how you are doing the next few weeks.  For the previous lightheadedness I will check blood counts and EKG today.  Please follow-up if the symptoms return.  Make sure you are drinking plenty of fluids throughout the day and try to obtain 7 to 8 hours of sleep if possible at night.  Restart melatonin, but if continued sleep difficulty, let me know as we can look into other options.     Diarrhea, Adult Diarrhea is when you have loose and water poop (stool) often. Diarrhea can make you feel weak and cause you to get dehydrated. Dehydration can make you tired and thirsty, make you have  a dry mouth, and make it so you pee (urinate) less often. Diarrhea often lasts 2-3 days. However, it can last longer if it is a sign of something more serious. It is important to treat your diarrhea as told by your doctor. Follow these  instructions at home: Eating and drinking  Follow these recommendations as told by your doctor:  Take an oral rehydration solution (ORS). This is a drink that is sold at pharmacies and stores.  Drink clear fluids, such as: ? Water. ? Ice chips. ? Diluted fruit juice. ? Low-calorie sports drinks.  Eat bland, easy-to-digest foods in small amounts as you are able. These foods include: ? Bananas. ? Applesauce. ? Rice. ? Low-fat (lean) meats. ? Toast. ? Crackers.  Avoid drinking fluids that have a lot of sugar or caffeine in them.  Avoid alcohol.  Avoid spicy or fatty foods.  General instructions   Drink enough fluid to keep your pee (urine) clear or pale yellow.  Wash your hands often. If you cannot use soap and water, use hand sanitizer.  Make sure that all people in your home wash their hands well and often.  Take over-the-counter and prescription medicines only as told by your doctor.  Rest at home while you get better.  Watch your condition for any changes.  Take a warm bath to help with any burning or pain from having diarrhea.  Keep all follow-up visits as told by your doctor. This is important. Contact a doctor if:  You have a fever.  Your diarrhea gets worse.  You have new symptoms.  You cannot keep fluids down.  You feel light-headed or dizzy.  You have a headache.  You have muscle cramps. Get help right away if:  You have chest pain.  You feel very weak or you pass out (faint).  You have bloody or black poop or poop that look like tar.  You have very bad pain, cramping, or bloating in your belly (abdomen).  You have trouble breathing or you are breathing very quickly.  Your heart is beating very quickly.  Your skin feels cold and clammy.  You feel confused.  You have signs of dehydration, such as: ? Dark pee, hardly any pee, or no pee. ? Cracked lips. ? Dry mouth. ? Sunken eyes. ? Sleepiness. ? Weakness. This information is  not intended to replace advice given to you by your health care provider. Make sure you discuss any questions you have with your health care provider. Document Released: 09/10/2007 Document Revised: 10/12/2015 Document Reviewed: 11/28/2014 Elsevier Interactive Patient Education  2018 [employer].  Insomnia Insomnia is a sleep disorder that makes it difficult to fall asleep or to stay asleep. Insomnia can cause tiredness (fatigue), low energy, difficulty concentrating, mood swings, and poor performance at work or school. There are three different ways to classify insomnia:  Difficulty falling asleep.  Difficulty staying asleep.  Waking up too early in the morning.  Any type of insomnia can be long-term (chronic) or short-term (acute). Both are common. Short-term insomnia usually lasts for three months or less. Chronic insomnia occurs at least three times a week for longer than three months. What are the causes? Insomnia may be caused by another condition, situation, or substance, such as:  Anxiety.  Certain medicines.  Gastroesophageal reflux disease (GERD) or other gastrointestinal conditions.  Asthma or other breathing conditions.  Restless legs syndrome, sleep apnea, or other sleep disorders.  Chronic pain.  Menopause. This may include hot flashes.  Stroke.  Abuse of alcohol, tobacco, or illegal drugs.  Depression.  Caffeine.  Neurological disorders, such as Alzheimer disease.  An overactive thyroid (hyperthyroidism).  The cause of insomnia may not be known. What increases the risk? Risk factors for insomnia include:  Gender. Women are more commonly affected than men.  Age. Insomnia is more common as you get older.  Stress. This may involve your professional or personal life.  Income. Insomnia is more common in people with lower income.  Lack of exercise.  Irregular work schedule or night shifts.  Traveling between different time zones.  What are the  signs or symptoms? If you have insomnia, trouble falling asleep or trouble staying asleep is the main symptom. This may lead to other symptoms, such as:  Feeling fatigued.  Feeling nervous about going to sleep.  Not feeling rested in the morning.  Having trouble concentrating.  Feeling irritable, anxious, or depressed.  How is this treated? Treatment for insomnia depends on the cause. If your insomnia is caused by an underlying condition, treatment will focus on addressing the condition. Treatment may also include:  Medicines to help you sleep.  Counseling or therapy.  Lifestyle adjustments.  Follow these instructions at home:  Take medicines only as directed by your health care provider.  Keep regular sleeping and waking hours. Avoid naps.  Keep a sleep diary to help you and your health care provider figure out what could be causing your insomnia. Include: ? When you sleep. ? When you wake up during the night. ? How well you sleep. ? How rested you feel the next day. ? Any side effects of medicines you are taking. ? What you eat and drink.  Make your bedroom a comfortable place where it is easy to fall asleep: ? Put up shades or special blackout curtains to block light from outside. ? Use a white noise machine to block noise. ? Keep the temperature cool.  Exercise regularly as directed by your health care provider. Avoid exercising right before bedtime.  Use relaxation techniques to manage stress. Ask your health care provider to suggest some techniques that may work well for you. These may include: ? Breathing exercises. ? Routines to release muscle tension. ? Visualizing peaceful scenes.  Cut back on alcohol, caffeinated beverages, and cigarettes, especially close to bedtime. These can disrupt your sleep.  Do not overeat or eat spicy foods right before bedtime. This can lead to digestive discomfort that can make it hard for you to sleep.  Limit screen use before  bedtime. This includes: ? Watching TV. ? Using your smartphone, tablet, and computer.  Stick to a routine. This can help you fall asleep faster. Try to do a quiet activity, brush your teeth, and go to bed at the same time each night.  Get out of bed if you are still awake after 15 minutes of trying to sleep. Keep the lights down, but try reading or doing a quiet activity. When you feel sleepy, go back to bed.  Make sure that you drive carefully. Avoid driving if you feel very sleepy.  Keep all follow-up appointments as directed by your health care provider. This is important. Contact a health care provider if:  You are tired throughout the day or have trouble in your daily routine due to sleepiness.  You continue to have sleep problems or your sleep problems get worse. Get help right away if:  You have serious thoughts about hurting yourself or someone else. This information is  not intended to replace advice given to you by your health care provider. Make sure you discuss any questions you have with your health care provider. Document Released: 03/21/2000 Document Revised: 08/24/2015 Document Reviewed: 12/23/2013 Elsevier Interactive Patient Education  [employer].   If you have lab work done today you will be contacted with your lab results within the next 2 weeks.  If you have not heard from [place] then please contact us. The fastest way to get your results is to register for My Chart.   IF you received an x-ray today, you will receive an invoice from [facility]. Please contact [facility] at [phone] with questions or concerns regarding your invoice.   IF you received labwork today, you will receive an invoice from [facility]. Please contact [facility] at [phone] with questions or concerns regarding your invoice.   Our billing staff will not be able to assist you with questions regarding bills from these companies.  You will be contacted with the lab  results as soon as they are available. The fastest way to get your results is to activate your My Chart account. Instructions are located on the last page of this paperwork. If you have not heard from [place] regarding the results in 2 weeks, please contact this office.       I personally performed the services described in this documentation, which was scribed in my presence. The recorded information has been reviewed and considered for accuracy and completeness, addended by me as needed, and agree with information above.  Signed,   [name], MD [facility] [place].  11/30/17 10:24 AM

## 2017-11-30 NOTE — Patient Instructions (Addendum)
Although diarrhea is a potential side effect of Lexapro, IBS may also be contributing with recent stressors. I will write temporary supply of hyoscamine, but would recommend discussing further with your gastroenterologist if those symptoms continue. I will continue same dose of Lexapro for now, but if the diarrhea persists, we can try a different SSRI.  Let me know how you are doing the next few weeks.  For the previous lightheadedness I will check blood counts. EKG today shows slow heart rate but I do not see other significant changes from prior EKG.  Please follow-up if the symptoms return, or if fatigue worsens.  Make sure you are drinking plenty of fluids throughout the day and try to obtain 7 to 8 hours of sleep if possible at night.  Restart melatonin, but if continued sleep difficulty, let me know as we can look into other options.    Diarrhea, Adult Diarrhea is when you have loose and water poop (stool) often. Diarrhea can make you feel weak and cause you to get dehydrated. Dehydration can make you tired and thirsty, make you have a dry mouth, and make it so you pee (urinate) less often. Diarrhea often lasts 2-3 days. However, it can last longer if it is a sign of something more serious. It is important to treat your diarrhea as told by your doctor. Follow these instructions at home: Eating and drinking  Follow these recommendations as told by your doctor:  Take an oral rehydration solution (ORS). This is a drink that is sold at pharmacies and stores.  Drink clear fluids, such as: ? Water. ? Ice chips. ? Diluted fruit juice. ? Low-calorie sports drinks.  Eat bland, easy-to-digest foods in small amounts as you are able. These foods include: ? Bananas. ? Applesauce. ? Rice. ? Low-fat (lean) meats. ? Toast. ? Crackers.  Avoid drinking fluids that have a lot of sugar or caffeine in them.  Avoid alcohol.  Avoid spicy or fatty foods.  General instructions   Drink enough fluid  to keep your pee (urine) clear or pale yellow.  Wash your hands often. If you cannot use soap and water, use hand sanitizer.  Make sure that all people in your home wash their hands well and often.  Take over-the-counter and prescription medicines only as told by your doctor.  Rest at home while you get better.  Watch your condition for any changes.  Take a warm bath to help with any burning or pain from having diarrhea.  Keep all follow-up visits as told by your doctor. This is important. Contact a doctor if:  You have a fever.  Your diarrhea gets worse.  You have new symptoms.  You cannot keep fluids down.  You feel light-headed or dizzy.  You have a headache.  You have muscle cramps. Get help right away if:  You have chest pain.  You feel very weak or you pass out (faint).  You have bloody or black poop or poop that look like tar.  You have very bad pain, cramping, or bloating in your belly (abdomen).  You have trouble breathing or you are breathing very quickly.  Your heart is beating very quickly.  Your skin feels cold and clammy.  You feel confused.  You have signs of dehydration, such as: ? Dark pee, hardly any pee, or no pee. ? Cracked lips. ? Dry mouth. ? Sunken eyes. ? Sleepiness. ? Weakness. This information is not intended to replace advice given to you by your health  care provider. Make sure you discuss any questions you have with your health care provider. Document Released: 09/10/2007 Document Revised: 10/12/2015 Document Reviewed: 11/28/2014 Elsevier Interactive Patient Education  2018 [employer].  Insomnia Insomnia is a sleep disorder that makes it difficult to fall asleep or to stay asleep. Insomnia can cause tiredness (fatigue), low energy, difficulty concentrating, mood swings, and poor performance at work or school. There are three different ways to classify insomnia:  Difficulty falling asleep.  Difficulty staying  asleep.  Waking up too early in the morning.  Any type of insomnia can be long-term (chronic) or short-term (acute). Both are common. Short-term insomnia usually lasts for three months or less. Chronic insomnia occurs at least three times a week for longer than three months. What are the causes? Insomnia may be caused by another condition, situation, or substance, such as:  Anxiety.  Certain medicines.  Gastroesophageal reflux disease (GERD) or other gastrointestinal conditions.  Asthma or other breathing conditions.  Restless legs syndrome, sleep apnea, or other sleep disorders.  Chronic pain.  Menopause. This may include hot flashes.  Stroke.  Abuse of alcohol, tobacco, or illegal drugs.  Depression.  Caffeine.  Neurological disorders, such as Alzheimer disease.  An overactive thyroid (hyperthyroidism).  The cause of insomnia may not be known. What increases the risk? Risk factors for insomnia include:  Gender. Women are more commonly affected than men.  Age. Insomnia is more common as you get older.  Stress. This may involve your professional or personal life.  Income. Insomnia is more common in people with lower income.  Lack of exercise.  Irregular work schedule or night shifts.  Traveling between different time zones.  What are the signs or symptoms? If you have insomnia, trouble falling asleep or trouble staying asleep is the main symptom. This may lead to other symptoms, such as:  Feeling fatigued.  Feeling nervous about going to sleep.  Not feeling rested in the morning.  Having trouble concentrating.  Feeling irritable, anxious, or depressed.  How is this treated? Treatment for insomnia depends on the cause. If your insomnia is caused by an underlying condition, treatment will focus on addressing the condition. Treatment may also include:  Medicines to help you sleep.  Counseling or therapy.  Lifestyle adjustments.  Follow these  instructions at home:  Take medicines only as directed by your health care provider.  Keep regular sleeping and waking hours. Avoid naps.  Keep a sleep diary to help you and your health care provider figure out what could be causing your insomnia. Include: ? When you sleep. ? When you wake up during the night. ? How well you sleep. ? How rested you feel the next day. ? Any side effects of medicines you are taking. ? What you eat and drink.  Make your bedroom a comfortable place where it is easy to fall asleep: ? Put up shades or special blackout curtains to block light from outside. ? Use a white noise machine to block noise. ? Keep the temperature cool.  Exercise regularly as directed by your health care provider. Avoid exercising right before bedtime.  Use relaxation techniques to manage stress. Ask your health care provider to suggest some techniques that may work well for you. These may include: ? Breathing exercises. ? Routines to release muscle tension. ? Visualizing peaceful scenes.  Cut back on alcohol, caffeinated beverages, and cigarettes, especially close to bedtime. These can disrupt your sleep.  Do not overeat or eat spicy foods right  before bedtime. This can lead to digestive discomfort that can make it hard for you to sleep.  Limit screen use before bedtime. This includes: ? Watching TV. ? Using your smartphone, tablet, and computer.  Stick to a routine. This can help you fall asleep faster. Try to do a quiet activity, brush your teeth, and go to bed at the same time each night.  Get out of bed if you are still awake after 15 minutes of trying to sleep. Keep the lights down, but try reading or doing a quiet activity. When you feel sleepy, go back to bed.  Make sure that you drive carefully. Avoid driving if you feel very sleepy.  Keep all follow-up appointments as directed by your health care provider. This is important. Contact a health care provider  if:  You are tired throughout the day or have trouble in your daily routine due to sleepiness.  You continue to have sleep problems or your sleep problems get worse. Get help right away if:  You have serious thoughts about hurting yourself or someone else. This information is not intended to replace advice given to you by your health care provider. Make sure you discuss any questions you have with your health care provider. Document Released: 03/21/2000 Document Revised: 08/24/2015 Document Reviewed: 12/23/2013 Elsevier Interactive Patient Education  [employer].   If you have lab work done today you will be contacted with your lab results within the next 2 weeks.  If you have not heard from [place] then please contact us. The fastest way to get your results is to register for My Chart.   IF you received an x-ray today, you will receive an invoice from [facility]. Please contact [facility] at [phone] with questions or concerns regarding your invoice.   IF you received labwork today, you will receive an invoice from [facility]. Please contact [facility] at [phone] with questions or concerns regarding your invoice.   Our billing staff will not be able to assist you with questions regarding bills from these companies.  You will be contacted with the lab results as soon as they are available. The fastest way to get your results is to activate your My Chart account. Instructions are located on the last page of this paperwork. If you have not heard from [place] regarding the results in 2 weeks, please contact this office.

## 2018-01-26 MED FILL — OMEPRAZOLE 20 MG CPDR: 20 | 90 days supply | Qty: 90 | Fill #2

## 2018-02-04 ENCOUNTER — Encounter: Payer: Self-pay | Admitting: Family Medicine

## 2018-02-09 ENCOUNTER — Ambulatory Visit (INDEPENDENT_AMBULATORY_CARE_PROVIDER_SITE_OTHER): Payer: 59

## 2018-02-09 ENCOUNTER — Other Ambulatory Visit: Payer: Self-pay

## 2018-02-09 ENCOUNTER — Ambulatory Visit (INDEPENDENT_AMBULATORY_CARE_PROVIDER_SITE_OTHER): Payer: 59 | Admitting: Physician Assistant

## 2018-02-09 ENCOUNTER — Encounter: Payer: Self-pay | Admitting: Physician Assistant

## 2018-02-09 VITALS — BP 117/76 | HR 52 | Temp 97.8°F | Resp 18 | Ht 72.21 in | Wt 182.8 lb

## 2018-02-09 DIAGNOSIS — R0781 Pleurodynia: Secondary | ICD-10-CM

## 2018-02-09 MED ORDER — NAPROXEN 500 MG PO TABS: 500.0000 mg | ORAL_TABLET | Freq: Two times a day (BID) | ORAL | 0 refills | Status: DC

## 2018-02-09 MED ORDER — CYCLOBENZAPRINE HCL 5 MG PO TABS: 5.0000 mg | ORAL_TABLET | Freq: Three times a day (TID) | ORAL | 0 refills | Status: DC | PRN

## 2018-02-09 MED FILL — NAPROXEN 500 MG TABLET: 500 | 15 days supply | Qty: 30 | Fill #0

## 2018-02-09 MED FILL — CYCLOBENZAPRINE 5 MG TABLET: 5 | 20 days supply | Qty: 60 | Fill #0

## 2018-02-09 NOTE — Patient Instructions (Addendum)
We are waiting for you xray results. I will contact you later today with the results and recommended treatment plan.    If you have lab work done today you will be contacted with your lab results within the next 2 weeks.  If you have not heard from [place] then please contact us. The fastest way to get your results is to register for My Chart.  Costochondritis Costochondritis is swelling and irritation (inflammation) of the tissue (cartilage) that connects your ribs to your breastbone (sternum). This causes pain in the front of your chest. The pain usually starts gradually and involves more than one rib. What are the causes? The exact cause of this condition is not always known. It results from stress on the cartilage where your ribs attach to your sternum. The cause of this stress could be:  Chest injury (trauma).  Exercise or activity, such as lifting.  Severe coughing.  What increases the risk? You may be at higher risk for this condition if you:  Are male.  Are 49 years old.  Recently started a new exercise or work activity.  Have low levels of vitamin D.  Have a condition that makes you cough frequently.  What are the signs or symptoms? The main symptom of this condition is chest pain. The pain:  Usually starts gradually and can be sharp or dull.  Gets worse with deep breathing, coughing, or exercise.  Gets better with rest.  May be worse when you press on the sternum-rib connection (tenderness).  How is this diagnosed? This condition is diagnosed based on your symptoms, medical history, and a physical exam. Your health care provider will check for tenderness when pressing on your sternum. This is the most important finding. You may also have tests to rule out other causes of chest pain. These may include:  A chest X-ray to check for lung problems.  An electrocardiogram (ECG) to see if you have a heart problem that could be causing the pain.  An imaging scan to  rule out a chest or rib fracture.  How is this treated? This condition usually goes away on its own over time. Your health care provider may prescribe an NSAID to reduce pain and inflammation. Your health care provider may also suggest that you:  Rest and avoid activities that make pain worse.  Apply heat or cold to the area to reduce pain and inflammation.  Do exercises to stretch your chest muscles.  If these treatments do not help, your health care provider may inject a numbing medicine at the sternum-rib connection to help relieve the pain. Follow these instructions at home:  Avoid activities that make pain worse. This includes any activities that use chest, abdominal, and side muscles.  If directed, put ice on the painful area: ? Put ice in a plastic bag. ? Place a towel between your skin and the bag. ? Leave the ice on for 20 minutes, 2-3 times a day.  If directed, apply heat to the affected area as often as told by your health care provider. Use the heat source that your health care provider recommends, such as a moist heat pack or a heating pad. ? Place a towel between your skin and the heat source. ? Leave the heat on for 20-30 minutes. ? Remove the heat if your skin turns bright red. This is especially important if you are unable to feel pain, heat, or cold. You may have a greater risk of getting burned.  Take over-the-counter  and prescription medicines only as told by your health care provider.  Return to your normal activities as told by your health care provider. Ask your health care provider what activities are safe for you.  Keep all follow-up visits as told by your health care provider. This is important. Contact a health care provider if:  You have chills or a fever.  Your pain does not go away or it gets worse.  You have a cough that does not go away (is persistent). Get help right away if:  You have shortness of breath. This information is not intended to  replace advice given to you by your health care provider. Make sure you discuss any questions you have with your health care provider. Document Released: 01/01/2005 Document Revised: 10/12/2015 Document Reviewed: 07/18/2015 Elsevier Interactive Patient Education  2018 [employer].   IF you received an x-ray today, you will receive an invoice from [facility]. Please contact [facility] at [phone] with questions or concerns regarding your invoice.   IF you received labwork today, you will receive an invoice from [facility]. Please contact [facility] at [phone] with questions or concerns regarding your invoice.   Our billing staff will not be able to assist you with questions regarding bills from these companies.  You will be contacted with the lab results as soon as they are available. The fastest way to get your results is to activate your My Chart account. Instructions are located on the last page of this paperwork. If you have not heard from [place] regarding the results in 2 weeks, please contact this office.

## 2018-02-09 NOTE — Progress Notes (Signed)
Darren Best  MRN: [id number] DOB: [birth year]  Subjective:  Darren Best is a 47 y.o. male seen in office today for a chief complaint of right anterior rib x 5 years. Intermittent.  Feels like a sharp pain and muscle spasm/twitching. Aggravated by: twisting trunk during bike ride, hunching over to tie shoes. Improved by: cannot identify anything Has tried NSAIDS, not sure if it it makes it better.  Denies pleuritic chest pain, abdominal pain, nausea, vomiting, diaphoresis, fever, and unexplained weight loss. He also denies any injuries  Drinks at least 2 beers a day.  Denies smoking.  Patient is very physically active, works out at least 3 days a week for 20min-1hr doing cardio.  PMH of dairy allergies and IBS. Of note, was evaluated for this by Dr. Carlota Raspberry 2016.  CMP normal.  Suspected costochondritis.  Discussed obtaining rib series x-ray if pain continues.   Review of Systems  Constitutional: Negative for fever.  Respiratory: Negative for cough and shortness of breath.   Cardiovascular: Negative for chest pain, palpitations and leg swelling.  Neurological: Negative for dizziness and light-headedness.     Patient Active Problem List   Diagnosis Date Noted  . Pain of left hip joint 12/02/2016  . Eosinophilic esophagitis 23/76/2831  . IBS (irritable bowel syndrome) 05/15/2015  . Cyst, eyelid sebaceous 03/30/2013    Current Outpatient Medications on File Prior to Visit  Medication Sig Dispense Refill  . escitalopram (LEXAPRO) 10 MG tablet Take 1 tablet (10 mg total) by mouth daily. (Patient not taking: Reported on 02/09/2018) 30 tablet 2  . famotidine (PEPCID) 20 MG tablet Take 20 mg by mouth daily as needed for heartburn or indigestion.     . hyoscyamine (LEVSIN, ANASPAZ) 0.125 MG tablet Take 1 tablet (0.125 mg total) by mouth every 4 (four) hours as needed for cramping. 30 tablet 1  . ibuprofen (ADVIL,MOTRIN) 200 MG tablet Take 200 mg by mouth every 6 (six) hours as needed.     Marland Kitchen omeprazole (PRILOSEC) 20 MG capsule Take 1 capsule (20 mg total) by mouth daily. 90 capsule 3  . ondansetron (ZOFRAN) 4 MG tablet Take 1 tablet (4 mg total) by mouth every 8 (eight) hours as needed for nausea or vomiting. 12 tablet 0  . ranitidine (ZANTAC) 150 MG capsule Take 150 mg by mouth 2 (two) times daily as needed for heartburn.      No current facility-administered medications on file prior to visit.     Allergies  Allergen Reactions  . Almond Oil Bitter Flavor [Flavoring Agent]   . Milk-Related Compounds    Past Surgical History:  Procedure Laterality Date  . EYE SURGERY    . lasix surgery     Social History   Socioeconomic History  . Marital status: Married    Spouse name: Not on file  . Number of children: 0  . Years of education: Not on file  . Highest education level: Not on file  Occupational History  . 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 Financial resource strain: Not on file  . Food insecurity:    Worry: Not on file    Inability: Not on file  . Transportation needs:    Medical: Not on file    Non-medical: Not on file  Tobacco Use  . Smoking status: Former Smoker    Types: Cigarettes  . Smokeless tobacco: Never Used  Substance and Sexual Activity  .  Alcohol use: Yes    Comment: 2 drinks daily - 2 beers max  . Drug use: No  . Sexual activity: Yes    Comment: number of sex partners in the last 12 months 1  Lifestyle  . Physical activity:    Days per week: Not on file    Minutes per session: Not on file  . Stress: Not on file  Relationships  . Social connections:    Talks on phone: Not on file    Gets together: Not on file    Attends religious service: Not on file    Active member of club or organization: Not on file    Attends meetings of clubs or organizations: Not on file    Relationship status: Not on file  . Intimate partner violence:    Fear of current or ex partner: Not on file     Emotionally abused: Not on file    Physically abused: Not on file    Forced sexual activity: Not on file  Other Topics Concern  . Not on file  Social History Narrative   College degree   [employer]   2-3 caffeine drinks daily   Exercise cycling and gym classes 2-3 times per week for 1-2 hours       Objective:  BP 117/76   Pulse (!) 52   Temp 97.8 F (36.6 C) (Oral)   Resp 18   Ht 6' 0.21" (1.834 m)   Wt 182 lb 12.8 oz (82.9 kg)   SpO2 98%   BMI 24.65 kg/m   Physical Exam  Constitutional: He is oriented to person, place, and time. He appears well-developed and well-nourished. No distress.  HENT:  Head: Normocephalic and atraumatic.  Mouth/Throat: Uvula is midline, oropharynx is clear and moist and mucous membranes are normal. No tonsillar exudate.  Eyes: Pupils are equal, round, and reactive to light. Conjunctivae and EOM are normal.  Neck: Normal range of motion.  Cardiovascular: Normal rate, regular rhythm, normal heart sounds and intact distal pulses.  Pulmonary/Chest: Effort normal and breath sounds normal. He has no decreased breath sounds. He has no wheezes. He has no rhonchi. He has no rales. He exhibits tenderness. He exhibits no mass, no laceration, no crepitus and no swelling.    Abdominal: Soft. Normal appearance and bowel sounds are normal. There is no tenderness. There is no rigidity, no rebound, no guarding, no CVA tenderness, no tenderness at McBurney's point and negative Murphy's sign.  Musculoskeletal:       Right lower leg: He exhibits no swelling.       Left lower leg: He exhibits no swelling.  Neurological: He is alert and oriented to person, place, and time.  Skin: Skin is warm and dry.  Psychiatric: He has a normal mood and affect.  Vitals reviewed.  Dg Ribs Unilateral W/chest Right  Result Date: 02/09/2018 CLINICAL DATA:  Chronic right rib pain. EXAM: RIGHT RIBS AND CHEST - 3+ VIEW COMPARISON:  Radiographs of August 03, 2016. FINDINGS: No  fracture or other bone lesions are seen involving the ribs. There is no evidence of pneumothorax or pleural effusion. Both lungs are clear. Heart size and mediastinal contours are within normal limits. IMPRESSION: Negative. Electronically Signed   By: Marijo Conception, M.D.   On: 02/09/2018 08:53    Assessment and Plan :  1. Rib pain Suspect costochondritis. Plain films reassuring. Rec NSAIDs and muscle relaxants for short period to see if this provides patient with relief.  If no improvement, rec referral to sports medicine.  - DG Ribs Unilateral W/Chest Right; Future  Meds ordered this encounter  Medications  . naproxen (NAPROSYN) 500 MG tablet    Sig: Take 1 tablet (500 mg total) by mouth 2 (two) times daily with a meal.    Dispense:  30 tablet    Refill:  0    Order Specific Question:   Supervising Provider    Answer:   Horald Pollen [id number]  . cyclobenzaprine (FLEXERIL) 5 MG tablet    Sig: Take 1 tablet (5 mg total) by mouth 3 (three) times daily as needed for muscle spasms.    Dispense:  60 tablet    Refill:  0    Order Specific Question:   Supervising Provider    Answer:   Horald Pollen [[id number]]     Tenna Delaine PA-C  [facility] 02/09/2018 1:08 PM

## 2018-03-11 ENCOUNTER — Ambulatory Visit (INDEPENDENT_AMBULATORY_CARE_PROVIDER_SITE_OTHER): Payer: 59 | Admitting: Family Medicine

## 2018-03-11 ENCOUNTER — Other Ambulatory Visit: Payer: Self-pay

## 2018-03-11 ENCOUNTER — Encounter: Payer: Self-pay | Admitting: Family Medicine

## 2018-03-11 VITALS — BP 123/82 | HR 58 | Temp 98.8°F | Ht 71.0 in | Wt 181.4 lb

## 2018-03-11 DIAGNOSIS — F329 Major depressive disorder, single episode, unspecified: Secondary | ICD-10-CM | POA: Diagnosis not present

## 2018-03-11 DIAGNOSIS — F32A Depression, unspecified: Secondary | ICD-10-CM

## 2018-03-11 DIAGNOSIS — R0789 Other chest pain: Secondary | ICD-10-CM | POA: Diagnosis not present

## 2018-03-11 DIAGNOSIS — R109 Unspecified abdominal pain: Secondary | ICD-10-CM | POA: Diagnosis not present

## 2018-03-11 MED ORDER — CYCLOBENZAPRINE HCL 5 MG PO TABS: 5.0000 mg | ORAL_TABLET | Freq: Three times a day (TID) | ORAL | 0 refills | Status: DC | PRN

## 2018-03-11 MED FILL — CYCLOBENZAPRINE 5 MG TABLET: 5 | 20 days supply | Qty: 60 | Fill #0

## 2018-03-11 NOTE — Patient Instructions (Addendum)
Rib/upper abdominal discomfort appears to be muscular or from lower rib area.  Based on your exam today I do not think other electrolyte tests or imaging is needed at this time.  If it continues to improve with Flexeril and occasional ibuprofen that is fine for now but if not resolving the next 3 to 4 weeks I would recommend evaluation with physical therapy.  Let me know and I can refer you.  John O'Hallorhan would be someone I recommend, but let me know if you have other preference. Ok to continue flexeril for now - try to cut back to bedtime if possible.   Return to the clinic or go to the nearest emergency room if any of your symptoms worsen or new symptoms occur..     Chest Wall Pain Chest wall pain is pain in or around the bones and muscles of your chest. Sometimes, an injury causes this pain. Sometimes, the cause may not be known. This pain may take several weeks or longer to get better. Follow these instructions at home: Pay attention to any changes in your symptoms. Take these actions to help with your pain:  Rest as told by your health care provider.  Avoid activities that cause pain. These include any activities that use your chest muscles or your abdominal and side muscles to lift heavy items.  If directed, apply ice to the painful area: ? Put ice in a plastic bag. ? Place a towel between your skin and the bag. ? Leave the ice on for 20 minutes, 2-3 times per day.  Take over-the-counter and prescription medicines only as told by your health care provider.  Do not use tobacco products, including cigarettes, chewing tobacco, and e-cigarettes. If you need help quitting, ask your health care provider.  Keep all follow-up visits as told by your health care provider. This is important.  Contact a health care provider if:  You have a fever.  Your chest pain becomes worse.  You have new symptoms. Get help right away if:  You have nausea or vomiting.  You feel sweaty or  light-headed.  You have a cough with phlegm (sputum) or you cough up blood.  You develop shortness of breath. This information is not intended to replace advice given to you by your health care provider. Make sure you discuss any questions you have with your health care provider. Document Released: 03/24/2005 Document Revised: 08/02/2015 Document Reviewed: 06/19/2014 Elsevier Interactive Patient Education  [employer].  If you have lab work done today you will be contacted with your lab results within the next 2 weeks.  If you have not heard from [place] then please contact us. The fastest way to get your results is to register for My Chart.   IF you received an x-ray today, you will receive an invoice from [facility]. Please contact [facility] at [phone] with questions or concerns regarding your invoice.   IF you received labwork today, you will receive an invoice from [facility]. Please contact [facility] at [phone] with questions or concerns regarding your invoice.   Our billing staff will not be able to assist you with questions regarding bills from these companies.  You will be contacted with the lab results as soon as they are available. The fastest way to get your results is to activate your My Chart account. Instructions are located on the last page of this paperwork. If you have not heard from [place] regarding the results in 2 weeks, please contact this  office.

## 2018-03-11 NOTE — Progress Notes (Signed)
Subjective:    Patient ID: Darren Best, male    DOB: 13-Dec-1970, 47 y.o.   MRN: [id number]  HPI Darren Best is a 47 y.o. male Presents today for: Chief Complaint  Patient presents with  . ABDOMINAL twitch  . Depression    f/u    Depression: Taking Lexapro 10 mg daily with some improvement of symptoms when last discussed in August.  Did discuss trial of other SSRI if needed due to potential side effects of meds versus flare of IBS.  Has taken Levsin as needed in the past for IBS and recommended discussing with his gastroenterologist as well.  Discontinued lexapro about a month ago.  Didn't feel like needed. Felt like cloudy headed on meds.  Feels better off meds and less GI issues. Denies depression symptoms currently.    Depression screen [facility] 2/9 03/11/2018 02/09/2018 11/30/2017 11/02/2017 10/23/2016  Decreased Interest 0 0 1 1 0  Down, Depressed, Hopeless 0 0 1 1 0  PHQ - 2 Score 0 0 2 2 0  Altered sleeping - - 1 1 -  Tired, decreased energy - - 1 1 -  Change in appetite - - 0 0 -  Feeling bad or failure about yourself  - - 0 1 -  Trouble concentrating - - 1 1 -  Moving slowly or fidgety/restless - - 0 0 -  Suicidal thoughts - - 0 0 -  PHQ-9 Score - - 5 6 -  Difficult doing work/chores - - Somewhat difficult Somewhat difficult -   Abdominal twitching: Thought to have costochondritis in past, eval 11/5.  - tried flexeril and naprosyn. Has woken up with twitching at night. Less spasm of muscle in am on flexeril - 2 times per day. Denies fatigue.  Burning type pain lower chest wall to upper right abdomen. No rash or swelling/redness of skin. Had initially noticed after long bike ride. No back pain. No weakness, no leg symptoms. Not taking naprosyn - no pain, just ibuprofen. If turning over or change in postion on side at night gets better.  normal CMP and TSH in July.   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   Patient Active Problem List   Diagnosis Date Noted  . Pain of  left hip joint 12/02/2016  . Eosinophilic esophagitis 71/69/6789  . IBS (irritable bowel syndrome) 05/15/2015  . Cyst, eyelid sebaceous 03/30/2013   Past Medical History:  Diagnosis Date  . Allergy   . Dysphagia   . Eosinophilic esophagitis   . GERD (gastroesophageal reflux disease)   . History of shingles   . Irritable bowel syndrome    Past Surgical History:  Procedure Laterality Date  . EYE SURGERY    . lasix surgery     Allergies  Allergen Reactions  . Almond Oil Bitter Flavor [Flavoring Agent]   . Milk-Related Compounds    Prior to Admission medications   Medication Sig Start Date End Date Taking? Authorizing Provider  cyclobenzaprine (FLEXERIL) 5 MG tablet Take 1 tablet (5 mg total) by mouth 3 (three) times daily as needed for muscle spasms. 02/09/18  Yes Timmothy Euler, [place] D, PA-C  famotidine (PEPCID) 20 MG tablet Take 20 mg by mouth daily as needed for heartburn or indigestion.    Yes [provider]  hyoscyamine (LEVSIN, ANASPAZ) 0.125 MG tablet Take 1 tablet (0.125 mg total) by mouth every 4 (four) hours as needed for cramping. 11/30/17  Yes [name], MD  ibuprofen (ADVIL,MOTRIN) 200 MG tablet Take  200 mg by mouth every 6 (six) hours as needed.   Yes [provider]  naproxen (NAPROSYN) 500 MG tablet Take 1 tablet (500 mg total) by mouth 2 (two) times daily with a meal. 02/09/18  Yes Timmothy Euler, [place] D, PA-C  omeprazole (PRILOSEC) 20 MG capsule Take 1 capsule (20 mg total) by mouth daily. 05/18/17  Yes Armbruster, Carlota Raspberry, MD  ondansetron (ZOFRAN) 4 MG tablet Take 1 tablet (4 mg total) by mouth every 8 (eight) hours as needed for nausea or vomiting. 08/03/16  Yes [name], NP   Social History   Socioeconomic History  . Marital status: Married    Spouse name: Not on file  . Number of children: 0  . Years of education: Not on file  . Highest education level: Not on file  Occupational History  . Occupation: Product manager: [employer]/ [employer]  . Financial resource strain: Not on file  . Food insecurity:    Worry: Not on file    Inability: Not on file  . Transportation needs:    Medical: Not on file    Non-medical: Not on file  Tobacco Use  . Smoking status: Former Smoker    Types: Cigarettes  . Smokeless tobacco: Never Used  Substance and Sexual Activity  . Alcohol use: Yes    Comment: 2 drinks daily - 2 beers max  . Drug use: No  . Sexual activity: Yes    Comment: number of sex partners in the last 12 months 1  Lifestyle  . Physical activity:    Days per week: Not on file    Minutes per session: Not on file  . Stress: Not on file  Relationships  . Social connections:    Talks on phone: Not on file    Gets together: Not on file    Attends religious service: Not on file    Active member of club or organization: Not on file    Attends meetings of clubs or organizations: Not on file    Relationship status: Not on file  . Intimate partner violence:    Fear of current or ex partner: Not on file    Emotionally abused: Not on file    Physically abused: Not on file    Forced sexual activity: Not on file  Other Topics Concern  . Not on file  Social History Narrative   College degree   [employer]   2-3 caffeine drinks daily   Exercise cycling and gym classes 2-3 times per week for 1-2 hours       Review of Systems  Constitutional: Negative for chills, fever and unexpected weight change.  Respiratory: Negative for cough and shortness of breath.   Gastrointestinal: Positive for abdominal pain. Negative for blood in stool, nausea and vomiting.  Genitourinary: Negative for difficulty urinating and hematuria.  Musculoskeletal: Positive for arthralgias (R lower chest wall. ).  Skin: Negative for rash.   Per HPI.     Objective:   Physical Exam  Constitutional: He is oriented to person, place, and time. He appears well-developed and well-nourished. No distress.    HENT:  Head: Normocephalic and atraumatic.  Cardiovascular: Normal rate.  Pulmonary/Chest: Effort normal.  Musculoskeletal: He exhibits no tenderness or deformity.  Neurological: He is alert and oriented to person, place, and time.  Skin: Skin is warm and dry. No rash noted.  Psychiatric: He has a normal mood  and affect. His behavior is normal. Thought content normal.   Right lower chest wall, upper right abdominal wall nontender, no rash, no apparent hernia or defects.  Notes discomfort with left lateral flexion.  Vitals:   03/11/18 1000  BP: 123/82  Pulse: (!) 58  Temp: 98.8 F (37.1 C)  TempSrc: Oral  SpO2: 97%  Weight: 181 lb 6.4 oz (82.3 kg)  Height: 5\' 11"  (1.803 m)       Assessment & Plan:    Darren Best is a 47 y.o. male Depression, unspecified depression type  -Overall stable off meds.  Continue to monitor symptoms and option of restarting SSRI  Chest wall pain Abdominal wall pain  -Overall reassuring exam.  Appears to be muscular source from lower rib area.  As pain improving deferred further testing or imaging.   -  Continue Flexeril with occasional ibuprofen over the next 3 to 4 weeks, then consider physical therapy if persistent.  RTC precautions if worsening sooner  Meds ordered this encounter  Medications  . cyclobenzaprine (FLEXERIL) 5 MG tablet    Sig: Take 1 tablet (5 mg total) by mouth 3 (three) times daily as needed for muscle spasms.    Dispense:  60 tablet    Refill:  0   Patient Instructions   Rib/upper abdominal discomfort appears to be muscular or from lower rib area.  Based on your exam today I do not think other electrolyte tests or imaging is needed at this time.  If it continues to improve with Flexeril and occasional ibuprofen that is fine for now but if not resolving the next 3 to 4 weeks I would recommend evaluation with physical therapy.  Let me know and I can refer you.  John O'Hallorhan would be someone I recommend, but let me know  if you have other preference. Ok to continue flexeril for now - try to cut back to bedtime if possible.   Return to the clinic or go to the nearest emergency room if any of your symptoms worsen or new symptoms occur..     Chest Wall Pain Chest wall pain is pain in or around the bones and muscles of your chest. Sometimes, an injury causes this pain. Sometimes, the cause may not be known. This pain may take several weeks or longer to get better. Follow these instructions at home: Pay attention to any changes in your symptoms. Take these actions to help with your pain:  Rest as told by your health care provider.  Avoid activities that cause pain. These include any activities that use your chest muscles or your abdominal and side muscles to lift heavy items.  If directed, apply ice to the painful area: ? Put ice in a plastic bag. ? Place a towel between your skin and the bag. ? Leave the ice on for 20 minutes, 2-3 times per day.  Take over-the-counter and prescription medicines only as told by your health care provider.  Do not use tobacco products, including cigarettes, chewing tobacco, and e-cigarettes. If you need help quitting, ask your health care provider.  Keep all follow-up visits as told by your health care provider. This is important.  Contact a health care provider if:  You have a fever.  Your chest pain becomes worse.  You have new symptoms. Get help right away if:  You have nausea or vomiting.  You feel sweaty or light-headed.  You have a cough with phlegm (sputum) or you cough up blood.  You develop  shortness of breath. This information is not intended to replace advice given to you by your health care provider. Make sure you discuss any questions you have with your health care provider. Document Released: 03/24/2005 Document Revised: 08/02/2015 Document Reviewed: 06/19/2014 Elsevier Interactive Patient Education  [employer].  If you have lab work done  today you will be contacted with your lab results within the next 2 weeks.  If you have not heard from [place] then please contact us. The fastest way to get your results is to register for My Chart.   IF you received an x-ray today, you will receive an invoice from [facility]. Please contact [facility] at [phone] with questions or concerns regarding your invoice.   IF you received labwork today, you will receive an invoice from [facility]. Please contact [facility] at [phone] with questions or concerns regarding your invoice.   Our billing staff will not be able to assist you with questions regarding bills from these companies.  You will be contacted with the lab results as soon as they are available. The fastest way to get your results is to activate your My Chart account. Instructions are located on the last page of this paperwork. If you have not heard from [place] regarding the results in 2 weeks, please contact this office.       Signed,   [name], MD [facility] [place].  03/14/18 11:29 AM

## 2018-03-14 ENCOUNTER — Encounter: Payer: Self-pay | Admitting: Family Medicine

## 2018-06-22 ENCOUNTER — Telehealth: Payer: Self-pay | Admitting: Gastroenterology

## 2018-06-22 ENCOUNTER — Encounter: Payer: Self-pay | Admitting: Family Medicine

## 2018-06-22 NOTE — Telephone Encounter (Signed)
Pt requested refill for omeprazole sent to [facility].  Pt is sched for OV 4.14.20.

## 2018-06-23 ENCOUNTER — Other Ambulatory Visit: Payer: Self-pay

## 2018-06-23 MED ORDER — OMEPRAZOLE 20 MG PO CPDR: 20.0000 mg | DELAYED_RELEASE_CAPSULE | Freq: Every day | ORAL | 1 refills | Status: DC

## 2018-06-23 MED FILL — OMEPRAZOLE 20 MG CPDR: 20 | 30 days supply | Qty: 30 | Fill #0

## 2018-06-23 NOTE — Progress Notes (Signed)
Refill sent to [facility]

## 2018-07-20 ENCOUNTER — Encounter: Payer: Self-pay | Admitting: Gastroenterology

## 2018-07-20 ENCOUNTER — Other Ambulatory Visit: Payer: Self-pay

## 2018-07-20 ENCOUNTER — Ambulatory Visit (INDEPENDENT_AMBULATORY_CARE_PROVIDER_SITE_OTHER): Payer: 59 | Admitting: Gastroenterology

## 2018-07-20 VITALS — Ht 71.0 in | Wt 180.0 lb

## 2018-07-20 DIAGNOSIS — K219 Gastro-esophageal reflux disease without esophagitis: Secondary | ICD-10-CM

## 2018-07-20 DIAGNOSIS — K580 Irritable bowel syndrome with diarrhea: Secondary | ICD-10-CM | POA: Diagnosis not present

## 2018-07-20 DIAGNOSIS — K200 Eosinophilic esophagitis: Secondary | ICD-10-CM

## 2018-07-20 MED ORDER — HYOSCYAMINE SULFATE 0.125 MG PO TABS: 0.1250 mg | ORAL_TABLET | ORAL | 3 refills | Status: DC | PRN

## 2018-07-20 MED ORDER — OMEPRAZOLE 20 MG PO CPDR
20.0000 mg | DELAYED_RELEASE_CAPSULE | Freq: Every day | ORAL | 3 refills | Status: DC
Start: 2018-07-20 — End: 2019-10-06

## 2018-07-20 MED FILL — OMEPRAZOLE 20 MG CPDR: 20 | 90 days supply | Qty: 90 | Fill #0

## 2018-07-20 MED FILL — HYOSCYAMINE SULF 0.125 MG T: 0.125 | 15 days supply | Qty: 90 | Fill #0

## 2018-07-20 NOTE — Progress Notes (Signed)
Virtual Visit via Video Note  I connected with Darren Best on 07/20/18 at  3:30 PM EDT by a video enabled telemedicine application and verified that I am speaking with the correct person using two identifiers.   I discussed the limitations of evaluation and management by telemedicine and the availability of in person appointments. The patient expressed understanding and agreed to proceed.  THIS ENCOUNTER IS A VIRTUAL VISIT DUE TO COVID-19 - PATIENT WAS NOT SEEN IN THE OFFICE. PATIENT HAS CONSENTED TO VIRTUAL VISIT / TELEMEDICINE VISIT USING [employer]ITY APP   Location of patient: home Location of provider: office Persons participating: myself, patient  HPI :  48 year old male here for a follow-up visit.  Please see prior notes regarding full history of this case. He has a history of eosinophilic esophagitis with history of food impactions.  He has had prior allergy testing which has shown allergies to milk, tomatoes, squash, and almonds.  He has previously been on oral Flovent as well as PPIs in the past.   Since our last visit he has been doing well in this regard. He has tried to minimize food allergens and for the most part does okay but thinks he does have exposure to mild and tomatoe at times. He would like to resume eating dairy, has questions about anything he can take or do to allow him to do that safely.  He denies any dysphagia that is bothering him much. No reflux or heartburn bothering him. He is taking omeprazole 20mg  / day.   He reports the bowels are stable. He has been having some loose stools at baseline. He has roughly 3-4 BMs per AM to start his day. At our last visit we gave him a sample of Viberzi, he did not like the way Viberzi may him feel and he stopped it. He is using immodium once or twice per week which reliably helps. No blood in the stools. No first degree family history of colon cancer. He uses hyocyamine PRN which helps. He is due for screening colonoscopy this  December.  He tested negative with celiac serologies since our last visit.  EGD 01/2011 - distal esophageal stricture, dilated with 5mm Savory, increased Eos to 37 / HPF EGD 06/2015 - subtle trachealization and furrowing of the esophagus, without stenosis, with the remainder of the exam being normal. Biopsies show increased eosinophils of the esophagus    Past Medical History:  Diagnosis Date  . Allergy   . Dysphagia   . Eosinophilic esophagitis   . GERD (gastroesophageal reflux disease)   . History of shingles   . Irritable bowel syndrome      Past Surgical History:  Procedure Laterality Date  . EYE SURGERY    . lasix surgery     Family History  Problem Relation Age of Onset  . Depression Father   . Heart disease Father        some coronary issues  . Aneurysm Father        kidney  . Mental illness Father   . Stroke Father   . Colon cancer Paternal Grandmother   . Diabetes Mother        adult onset  . Diabetes Brother    Social History   Tobacco Use  . Smoking status: Former Smoker    Types: Cigarettes  . Smokeless tobacco: Never Used  Substance Use Topics  . Alcohol use: Yes    Comment: 2 drinks daily - 2 beers max  . Drug  use: No   Current Outpatient Medications  Medication Sig Dispense Refill  . fexofenadine (ALLEGRA) 180 MG tablet Take 180 mg by mouth daily.     . hyoscyamine (LEVSIN, ANASPAZ) 0.125 MG tablet Take 1 tablet (0.125 mg total) by mouth every 4 (four) hours as needed for cramping. 30 tablet 1  . ibuprofen (ADVIL,MOTRIN) 200 MG tablet Take 200 mg by mouth every 6 (six) hours as needed.    . naproxen (NAPROSYN) 500 MG tablet Take 1 tablet (500 mg total) by mouth 2 (two) times daily with a meal. 30 tablet 0  . omeprazole (PRILOSEC) 20 MG capsule Take 1 capsule (20 mg total) by mouth daily. 30 capsule 1  . ondansetron (ZOFRAN) 4 MG tablet Take 1 tablet (4 mg total) by mouth every 8 (eight) hours as needed for nausea or vomiting. 12 tablet 0  .  famotidine (PEPCID) 20 MG tablet Take 20 mg by mouth daily as needed for heartburn or indigestion.      No current facility-administered medications for this visit.    Allergies  Allergen Reactions  . Almond Oil Bitter Flavor [Flavoring Agent]   . Milk-Related Compounds      Review of Systems: All systems reviewed and negative except where noted in HPI.   Lab Results  Component Value Date   WBC 3.4 11/30/2017   HGB 14.1 11/30/2017   HCT 42.2 11/30/2017   MCV 92 11/30/2017   PLT 228 11/30/2017    Lab Results  Component Value Date   CREATININE 1.01 11/02/2017   BUN 11 11/02/2017   NA 142 11/02/2017   K 4.8 11/02/2017   CL 103 11/02/2017   CO2 24 11/02/2017    Lab Results  Component Value Date   ALT 13 11/02/2017   AST 22 11/02/2017   ALKPHOS 64 11/02/2017   BILITOT 0.6 11/02/2017     Physical Exam: NA    ASSESSMENT AND PLAN: 48 y/o male here for reassessment of the following issues:  EoE / GERD - generally controlled with omeprazole 20mg  / day and avoidance of known food allergens. He would like to eat dairy periodically and asks about anything he can take for that to prevent a reaction. Could use flovent or budesonide in this situation if he really wanted to eat his known allergens but I asked him to touch base with his allergist about it, I am not aware of immunotherapy being used for EoE currently for adults. Fortunately his symptoms are fairly well controlled and he thinks he does have exposure to mild and tomatoes, etc, occasionally. Will refill the omeprazole 20mg  / day, he can follow up with me yearly for this issue.  IBS-D - using immodium and hyocyamine PRN, both of which work quite well. We gave him a trial of Viberzi at our last visit which caused some side effect and he did not like how he felt with it. He has had negative testing for celiac disease and no alarm symptoms. Given most of his stools are in the AM, he could try taking immodium q HS to see if  that helps. He is due for colon cancer screening in December of this year, will proceed with optical colonoscopy at that time given his symptoms. He agreed.  [place] Cellar, MD [facility]

## 2018-10-01 IMAGING — DX DG HIP (WITH OR WITHOUT PELVIS) 2-3V*L*
2 series · 2 of 2 positions shown · non-contrast
Comparison: None.

CLINICAL DATA: Worsening left hip pain for several months, no acute
injury

EXAM:
DG HIP (WITH OR WITHOUT PELVIS) 2-3V LEFT

[pelvis ap]
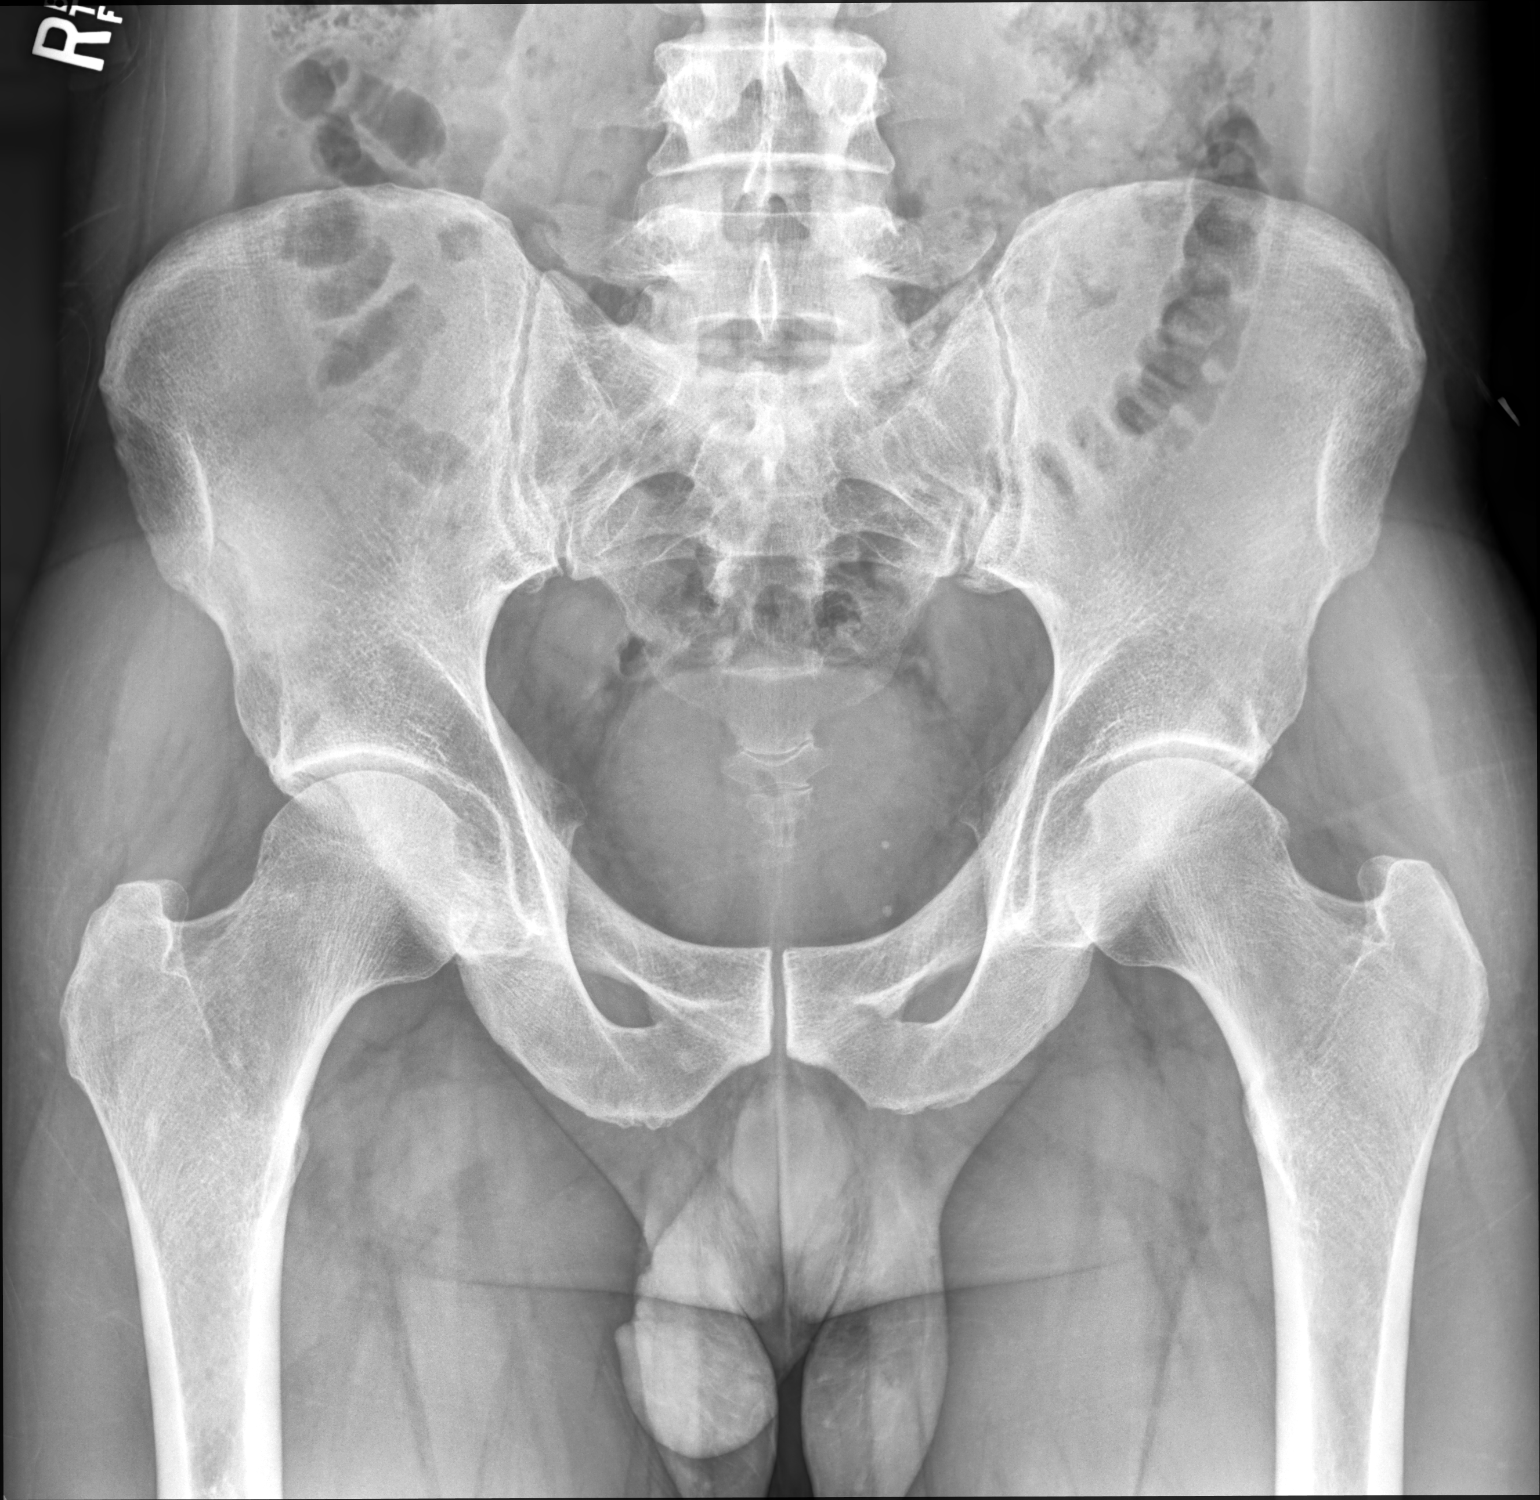

[hip joint (frog view)]
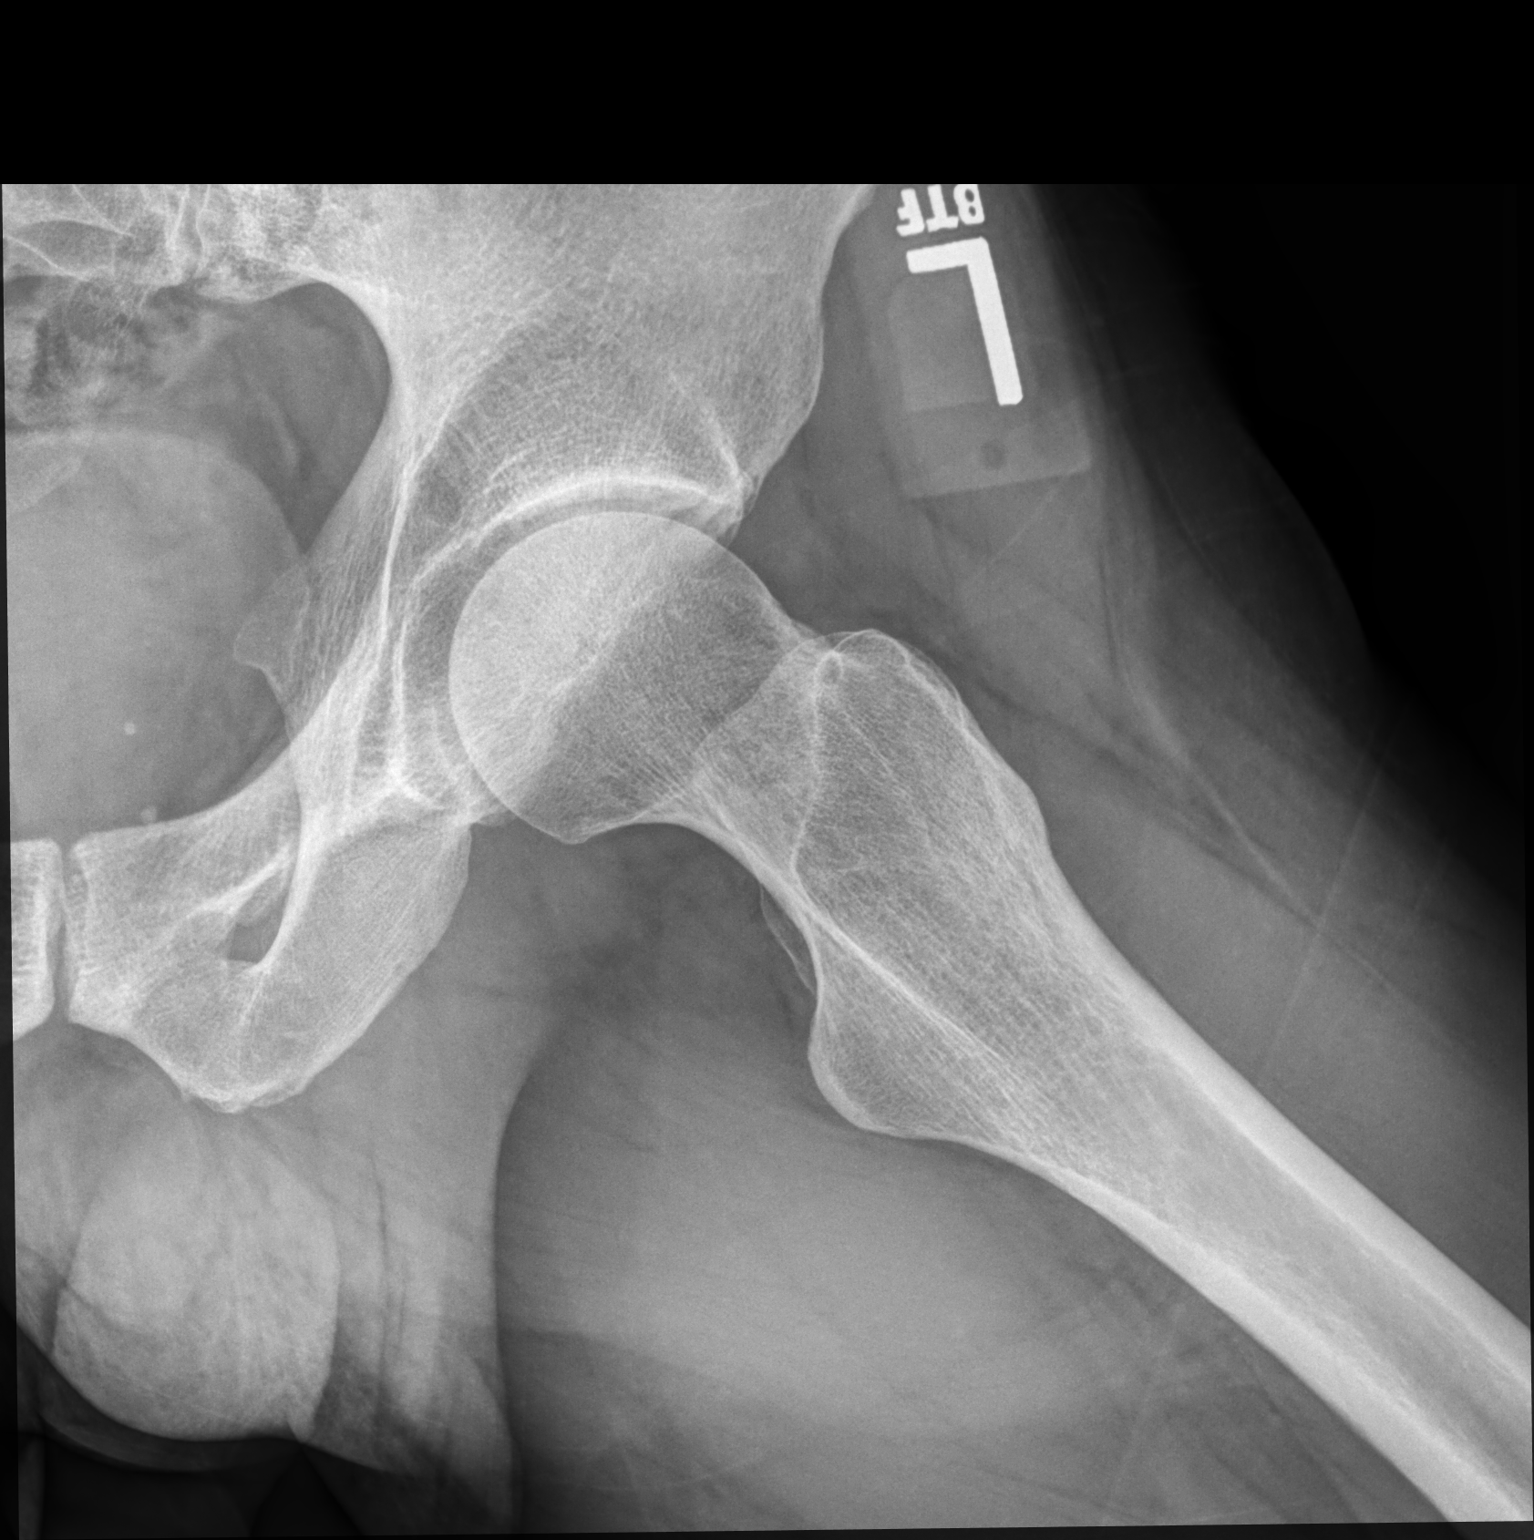

[2 of 2 positions shown; findings below may reference images not displayed]

FINDINGS: No acute fracture is seen. No significant degenerative change of the
hips is seen although there is slight loss of left hip joint space.
The pelvic rami are intact. The SI joints are corticated.
IMPRESSION: No acute abnormality.

## 2018-11-24 ENCOUNTER — Encounter: Payer: Self-pay | Admitting: Family Medicine

## 2018-11-24 ENCOUNTER — Other Ambulatory Visit: Payer: Self-pay

## 2018-11-24 ENCOUNTER — Ambulatory Visit (INDEPENDENT_AMBULATORY_CARE_PROVIDER_SITE_OTHER): Payer: 59 | Admitting: Family Medicine

## 2018-11-24 VITALS — BP 107/68 | HR 50 | Temp 98.4°F | Resp 14 | Wt 186.2 lb

## 2018-11-24 DIAGNOSIS — E785 Hyperlipidemia, unspecified: Secondary | ICD-10-CM

## 2018-11-24 DIAGNOSIS — Z0001 Encounter for general adult medical examination with abnormal findings: Secondary | ICD-10-CM

## 2018-11-24 DIAGNOSIS — Z0000 Encounter for general adult medical examination without abnormal findings: Secondary | ICD-10-CM

## 2018-11-24 DIAGNOSIS — Z1329 Encounter for screening for other suspected endocrine disorder: Secondary | ICD-10-CM | POA: Diagnosis not present

## 2018-11-24 DIAGNOSIS — S86891A Other injury of other muscle(s) and tendon(s) at lower leg level, right leg, initial encounter: Secondary | ICD-10-CM | POA: Diagnosis not present

## 2018-11-24 DIAGNOSIS — F4323 Adjustment disorder with mixed anxiety and depressed mood: Secondary | ICD-10-CM | POA: Diagnosis not present

## 2018-11-24 MED ORDER — SERTRALINE HCL 50 MG PO TABS: 50.0000 mg | ORAL_TABLET | Freq: Every day | ORAL | 3 refills | Status: DC

## 2018-11-24 MED FILL — SERTRALINE HCL 50 MG TABLET: 50 | 30 days supply | Qty: 30 | Fill #0

## 2018-11-24 NOTE — Patient Instructions (Addendum)
I do recommend meeting with counselor. EAP is great to start. Here is another separate resource for counseling: [facility]: [phone]  I also recommend restarting med - start zoloft 50mg  per day. recheck in 6 weeks, but give me an update by Mychart in next 2-3 weeks. Return to the clinic or go to the nearest emergency room if any of your symptoms worsen or new symptoms occur.  For leg symptoms/sihin splints, crosstraining with swimming,  bike or elliptical may be helpful temporarily, or adjusting mileage as we discussed.  If you continue to have symptoms, I would recommend meeting with [facility].  Let me know and I can place that referral.  Return to the clinic or go to the nearest emergency room if any of your symptoms worsen or new symptoms occur.  Keeping you healthy  Get these tests  Blood pressure- Have your blood pressure checked once a year by your healthcare provider.  Normal blood pressure is 120/80.  Weight- Have your body mass index (BMI) calculated to screen for obesity.  BMI is a measure of body fat based on height and weight. You can also calculate your own BMI at [employer].  Cholesterol- Have your cholesterol checked regularly starting at age 2, sooner may be necessary if you have diabetes, high blood pressure, if a family member developed heart diseases at an early age or if you smoke.   Chlamydia, HIV, and other sexual transmitted disease- Get screened each year until the age of 87 then within three months of each new sexual partner.  Diabetes- Have your blood sugar checked regularly if you have high blood pressure, high cholesterol, a family history of diabetes or if you are overweight.  Get these vaccines  Flu shot- Every fall.  Tetanus shot- Every 10 years.  Menactra- Single dose; prevents meningitis.  Take these steps  Don't smoke- If you do smoke, ask your healthcare provider about quitting. For tips on how to  quit, go to www.smokefree.gov or call 1-800-QUIT-NOW.  Be physically active- Exercise 5 days a week for at least 30 minutes.  If you are not already physically active start slow and gradually work up to 30 minutes of moderate physical activity.  Examples of moderate activity include walking briskly, mowing the yard, dancing, swimming bicycling, etc.  Eat a healthy diet- Eat a variety of healthy foods such as fruits, vegetables, low fat milk, low fat cheese, yogurt, lean meats, poultry, fish, beans, tofu, etc.  For more information on healthy eating, go to www.thenutritionsource.org  Drink alcohol in moderation- Limit alcohol intake two drinks or less a day.  Never drink and drive.  Dentist- Brush and floss teeth twice daily; visit your dentis twice a year.  Depression-Your emotional health is as important as your physical health.  If you're feeling down, losing interest in things you normally enjoy please talk with your healthcare provider.  Gun Safety- If you keep a gun in your home, keep it unloaded and with the safety lock on.  Bullets should be stored separately.  Helmet use- Always wear a helmet when riding a motorcycle, bicycle, rollerblading or skateboarding.  Safe sex- If you may be exposed to a sexually transmitted infection, use a condom  Seat belts- Seat bels can save your life; always wear one.  Smoke/Carbon Monoxide detectors- These detectors need to be installed on the appropriate level of your home.  Replace batteries at least once a year.  Skin Cancer- When out in the sun, cover up and  use sunscreen SPF 15 or higher.  Violence- If anyone is threatening or hurting you, please tell your healthcare provider.   Shin Splints  Shin splints is a painful condition that is felt either on the bone that is located in the front of the lower leg (tibia or shin bone) or in the muscles on either side of the bone. This condition happens when physical activities lead to inflammation of  the muscles, tendons, and the thin layer of tissue that covers the shin bone. It may result from participating in sports or other intense exercise. What are the causes? This condition may be caused by:  Overuse of muscles.  Repetitive activities.  Flat feet or rigid arches. Activities that could contribute to shin splints include:  Having a sudden increase in exercise time.  Starting a new, intense activity.  Running up hills or long distances.  Playing sports that involve sudden starts and stops.  Not warming up before activity.  Wearing old or worn-out shoes. What are the signs or symptoms? The main symptom of this condition is pain that occurs:  On the front of the lower leg.  In the muscles on either side of the shin bone.  While exercising or at rest. How is this diagnosed? This condition may be diagnosed based on:  A physical exam.  Your symptoms.  An observation of you while you are walking or running.  X-rays or other imaging tests. These may be done to rule out other problems. How is this treated? Treatment for this condition depends on your age, history, overall health, and how bad the pain is. Most cases of shin splints can be managed by doing one or more of the following:  Resting.  Reducing the length and intensity of your exercise.  Stopping the activity that causes shin pain.  Taking medicines to control the inflammation.  Icing, massaging, stretching, and strengthening the affected area.  Wearing shoes that have rigid heels, shock absorption, and a good arch support. For severe shin pain, your health care provider may recommend that you use crutches to avoid putting weight on your legs. Follow these instructions at home: Medicines  Take over-the-counter and prescription medicines only as told by your health care provider.  Do not drive or use heavy machinery while taking prescription pain medicine. Managing pain, stiffness, and swelling       If directed, apply ice to the painful area. Icing can help to relieve pain and swelling. ? Put ice in a plastic bag. ? Place a towel between your skin and the bag. ? Leave the ice on for 20 minutes, 2-3 times a day.  If directed, apply heat to the painful area before stretching exercises, or as told by your health care provider. Heat can help to relax your muscles. Use the heat source that your health care provider recommends, such as a moist heat pack or a heating pad. ? Place a towel between your skin and the heat source. ? Leave the heat on for 20-30 minutes. ? Remove the heat if your skin turns bright red. This is especially important if you are unable to feel pain, heat, or cold. You may have a greater risk of getting burned.  Massage, stretch, and strengthen the affected area as directed by your health care provider.  Wear compression sleeves or socks as told by your health care provider.  Raise (elevate) your legs above the level of your heart while you are sitting or lying down. Activity  Rest  as needed. Return to activity gradually as told by your health care provider.  When you start exercising again, begin with non-weight-bearing exercises, such as cycling or swimming.  Stop running if the pain returns.  Warm up properly before exercising.  Run on a surface that is level and fairly firm.  Gradually change the intensity of an exercise.  If you increase your running distance, add only 5-10% to your distance each week. This means that if you are running 5 miles this week, you should only increase your run by - mile for next week. General instructions  Wear shoes that have rigid heels, shock absorption, and a good arch support.  Change your athletic shoes every 6 months, or every 350-450 miles.  Keep all follow-up visits as told by your health care provider. This is important. Contact a health care provider if:  Your symptoms continue, or they worsen even after  treatment.  The location, intensity, or type of pain changes over time.  You have swelling in your lower leg that gets worse.  Your shin becomes red and feels warm. Get help right away if:  You have severe pain.  You have trouble walking. Summary  Shin splints happens when physical activities lead to inflammation of the muscles, tendons, and the thin layer of tissue that covers the shin bone.  Treatments may include medicines, resting, and icing.  Return to activity gradually as directed by your health care provider.  Make sure you know what symptoms should cause you to contact your health care provider. This information is not intended to replace advice given to you by your health care provider. Make sure you discuss any questions you have with your health care provider. Document Released: 03/21/2000 Document Revised: 04/13/2017 Document Reviewed: 04/13/2017 Elsevier Patient Education  [employer].   Adjustment Disorder, Adult Adjustment disorder is a group of symptoms that can develop after a stressful life event, such as the loss of a job or serious physical illness. The symptoms can affect how you feel, think, and act. They may interfere with your relationships. Adjustment disorder increases your risk of suicide and substance abuse. If this disorder is not managed early, it can develop into a more serious condition, such as major depressive disorder or post-traumatic stress disorder. What are the causes? This condition happens when you have trouble recovering from or coping with a stressful life event. What increases the risk? You are more likely to develop this condition if:  You have had depression or anxiety.  You are being treated for a long-term (chronic) illness.  You are being treated for an illness that cannot be cured (terminal illness).  You have a family history of mental illness. What are the signs or symptoms? Symptoms of this condition  include:  Extreme trouble doing daily tasks, such as going to work.  Sadness, depression, or crying spells.  Worrying a lot.  Loss of enjoyment.  Change in appetite or weight.  Feelings of loss or hopelessness.  Thoughts of suicide.  Anxiety, worry, or nervousness.  Trouble sleeping.  Avoiding family and friends.  Fighting or vandalism.  Complaining of feeling sick without being ill.  Feeling dazed or disconnected.  Nightmares.  Trouble sleeping.  Irritability.  Reckless driving.  Poor work Systems analyst.  Ignoring bills. Symptoms of this condition start within three months of the stressful event. They do not last more than six months, unless the stressful circumstances last longer. Normal grieving after the death of a loved one is  not a symptom of this condition. How is this diagnosed? To diagnose this condition, your health care provider will ask about what has happened in your life and how it has affected you. He or she may also ask about your medical history and your use of medicines, alcohol, and other substances. Your health care provider may do a physical exam and order lab tests or other studies. You may be referred to a mental health specialist. How is this treated? Treatment options for this condition include:  Counseling or talk therapy. Talk therapy is usually provided by mental health specialists.  Medicines. Certain medicines may help with depression, anxiety, and sleep.  Support groups. These offer emotional support, advice, and guidance. They are made up of people who have had similar experiences.  Observation and time. This is sometimes called "watchful waiting." In this treatment, health care providers monitor your health and behavior without other treatment. Adjustment disorder sometimes gets better on its own with time. Follow these instructions at home:  Take over-the-counter and prescription medicines only as told by your health care  provider.  Keep all follow-up visits as told by your health care provider. This is important. Contact a health care provider if:  Your symptoms do not improve in six months.  Your symptoms get worse. Get help right away if:  You have serious thoughts about hurting yourself or someone else. If you ever feel like you may hurt yourself or others, or have thoughts about taking your own life, get help right away. You can go to your nearest emergency department or call:  Your local emergency services (911 in the [place]  A suicide crisis helpline, such as the [facility] at 4370199149. This is open 24 hours a day. Summary  Adjustment disorder is a group of symptoms that can develop after a stressful life event, such as the loss of a job or serious physical illness. The symptoms can affect how you feel, think, and act. They may interfere with your relationships.  Symptoms of this condition start within three months of the stressful event. They do not last more than six months, unless the stressful circumstances last longer.  Treatment may include talk therapy, medicines, participation in a support group, or observation to see if symptoms improve.  Contact your health care provider if your symptoms get worse or do not improve in six months.  If you ever feel like you may hurt yourself or others, or have thoughts about taking your own life, get help right away. This information is not intended to replace advice given to you by your health care provider. Make sure you discuss any questions you have with your health care provider. Document Released: 11/26/2005 Document Revised: 03/06/2017 Document Reviewed: 05/23/2016 Elsevier Patient Education  [employer].   If you have lab work done today you will be contacted with your lab results within the next 2 weeks.  If you have not heard from [place] then please contact us. The fastest way to get your results is to  register for My Chart.   IF you received an x-ray today, you will receive an invoice from [facility]. Please contact [facility] at [phone] with questions or concerns regarding your invoice.   IF you received labwork today, you will receive an invoice from [facility]. Please contact [facility] at [phone] with questions or concerns regarding your invoice.   Our billing staff will not be able to assist you with questions regarding bills from these companies.  You will be contacted with the lab results as soon as they are available. The fastest way to get your results is to activate your My Chart account. Instructions are located on the last page of this paperwork. If you have not heard from [place] regarding the results in 2 weeks, please contact this office.

## 2018-11-24 NOTE — Progress Notes (Addendum)
Subjective:    Patient ID: Darren Best, male    DOB: July 31, 1970, 48 y.o.   MRN: [id number]  HPI Darren Best is a 48 y.o. male Presents today for: Chief Complaint  Patient presents with   Annual Exam    Here for annual physcial. GAD7=12 PHQ9=7 Patient stateded he was put on lexapro last year but he stopped it thinking he was doing better but think need to go back on something for anxiety and depression   Depression with anxiety: Discussed last year.  Had been on Lexapro 10 mg daily, did state he felt some cloudy headed symptoms on that medication.  Felt better off medicines when discussed in December and less gastrointestinal issues.  Does have a history of IBS and has used Levsin as needed.  Denies depression symptoms when discussed last December. Has had some flare of symptoms.  PHQ score of 7, gad 7 score of 12 as below.  Feels some anxiety with current world events. Feels more symptoms past 6 months or so, more recently. 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 Recent Therapist, art. Increased anxiety. Mom's health is not doing well. 82yo.  Sister is nurse - has been helping. Mom's mental state (dementia) is wavering- concerned about her possibly passing with loss of brother last year and dad the year before.   Has not talked to counseling/EAP since last year after brother's passing.    Depression screen [facility] 2/9 11/24/2018 03/11/2018 02/09/2018 11/30/2017 11/02/2017  Decreased Interest 1 0 0 1 1  Down, Depressed, Hopeless 1 0 0 1 1  PHQ - 2 Score 2 0 0 2 2  Altered sleeping 2 - - 1 1  Tired, decreased energy 1 - - 1 1  Change in appetite 0 - - 0 0  Feeling bad or failure about yourself  1 - - 0 1  Trouble concentrating 1 - - 1 1  Moving slowly or fidgety/restless 0 - - 0 0  Suicidal thoughts 0 - - 0 0  PHQ-9 Score 7 - - 5 6  Difficult doing work/chores - - - Somewhat difficult Somewhat difficult   GAD 7 : Generalized Anxiety Score 11/24/2018  Nervous,  Anxious, on Edge 2  Control/stop worrying 2  Worry too much - different things 2  Trouble relaxing 2  Restless 1  Easily annoyed or irritable 2  Afraid - awful might happen 1  Total GAD 7 Score 12  Anxiety Difficulty Not difficult at all   Irritable bowel syndrome, history of eosinophilic esophagitis Gastroenterology Dr. Havery Moros Has taken Prilosec QD, off pepcid. Has levsin as needed as well as Zofran as needed.  Allergic rhinitis: Has treated with Allegra once per day  Cancer screening: Family history of cancer: brother - lung. Grandmother - stomach or colon cancer. Plans on colonoscopy later this year.  Prostate cancer screening - declined after R/B discussion.   Immunization History  Administered Date(s) Administered   Influenza, Seasonal, Injecte, Preservative Fre 01/01/2015, 01/18/2016   Tdap 04/26/2012   Hyperlipidemia:  Lab Results  Component Value Date   CHOL 210 (H) 11/02/2017   HDL 63 11/02/2017   LDLCALC 132 (H) 11/02/2017   TRIG 73 11/02/2017   CHOLHDL 3.3 11/02/2017   Lab Results  Component Value Date   ALT 13 11/02/2017   AST 22 11/02/2017   ALKPHOS 64 11/02/2017   BILITOT 0.6 11/02/2017  Not on medications.  Low ASCVD risk score. The 10-year ASCVD risk score [name]  DC Jr., et al., 2013) is: 1.9%   Values used to calculate the score:     Age: 64 years     Sex: Male     Is Non-Hispanic African American: No     Diabetic: No     Tobacco smoker: No     Systolic Blood Pressure: 431 mmHg     Is BP treated: No     HDL Cholesterol: 63 mg/dL     Total Cholesterol: 210 mg/dL    Hearing Screening   125Hz  250Hz  500Hz  1000Hz  2000Hz  3000Hz  4000Hz  6000Hz  8000Hz   Right ear:           Left ear:             Visual Acuity Screening   Right eye Left eye Both eyes  Without correction: 20/15 20/15 20/15   With correction:      Tobacco - nonsmoker. Quit about 15 years ago- rare use with drinks only. 1/2 pack per week for 9 yrs. About 4.5 pack yr hx.    Alcohol: 10-12 per week. 1-2 per night. No DUI/legal issues. Less alcohol recently.   Dental:ongoing care - appt yesterday.   Exercise: running, personal trainer once per week. Some shin splints - trying to work on stretching, shoes. Sx on lower R leg lateral to shin and lower medial no bone pain.   STI screening:declines.    Patient Active Problem List   Diagnosis Date Noted   Pain of left hip joint 54/00/8676   Eosinophilic esophagitis 19/50/9326   IBS (irritable bowel syndrome) 05/15/2015   Cyst, eyelid sebaceous 03/30/2013   Past Medical History:  Diagnosis Date   Allergy    Dysphagia    Eosinophilic esophagitis    GERD (gastroesophageal reflux disease)    History of shingles    Irritable bowel syndrome    Past Surgical History:  Procedure Laterality Date   EYE SURGERY     lasix surgery     Allergies  Allergen Reactions   Almond Oil Bitter Flavor [Flavoring Agent]    Milk-Related Compounds    Prior to Admission medications   Medication Sig Start Date End Date Taking? Authorizing Provider  famotidine (PEPCID) 20 MG tablet Take 20 mg by mouth daily as needed for heartburn or indigestion.    Yes [provider]  fexofenadine (ALLEGRA) 180 MG tablet Take 180 mg by mouth daily.    Yes [provider]  hyoscyamine (LEVSIN, ANASPAZ) 0.125 MG tablet Take 1 tablet (0.125 mg total) by mouth every 4 (four) hours as needed for cramping. 07/20/18  Yes Armbruster, Carlota Raspberry, MD  ibuprofen (ADVIL,MOTRIN) 200 MG tablet Take 200 mg by mouth every 6 (six) hours as needed.   Yes [provider]  omeprazole (PRILOSEC) 20 MG capsule Take 1 capsule (20 mg total) by mouth daily. 07/20/18  Yes Armbruster, Carlota Raspberry, MD  ondansetron (ZOFRAN) 4 MG tablet Take 1 tablet (4 mg total) by mouth every 8 (eight) hours as needed for nausea or vomiting. 08/03/16  Yes [name], NP   Social History   Socioeconomic History   Marital status: Married     Spouse name: Not on file   Number of children: 0   Years of education: Not on file   Highest education level: Not on file  Occupational History   Occupation: Product manager:     Comment: [employer]/ [employer]  Social Needs   Financial resource strain: Not on file   Food insecurity  Worry: Not on file    Inability: Not on file   Transportation needs    Medical: Not on file    Non-medical: Not on file  Tobacco Use   Smoking status: Former Smoker    Types: Cigarettes   Smokeless tobacco: Never Used  Substance and Sexual Activity   Alcohol use: Yes    Comment: 2 drinks daily - 2 beers max   Drug use: No   Sexual activity: Yes    Comment: number of sex partners in the last 12 months 1  Lifestyle   Physical activity    Days per week: Not on file    Minutes per session: Not on file   Stress: Not on file  Relationships   Social connections    Talks on phone: Not on file    Gets together: Not on file    Attends religious service: Not on file    Active member of club or organization: Not on file    Attends meetings of clubs or organizations: Not on file    Relationship status: Not on file   Intimate partner violence    Fear of current or ex partner: Not on file    Emotionally abused: Not on file    Physically abused: Not on file    Forced sexual activity: Not on file  Other Topics Concern   Not on file  Social History Narrative   College degree   [employer]   2-3 caffeine drinks daily   Exercise cycling and gym classes 2-3 times per week for 1-2 hours     Review of Systems     Objective:   Physical Exam Vitals signs reviewed.  Constitutional:      Appearance: He is well-developed.  HENT:     Head: Normocephalic and atraumatic.     Right Ear: External ear normal.     Left Ear: External ear normal.  Eyes:     Conjunctiva/sclera: Conjunctivae normal.     Pupils: Pupils are equal, round, and reactive to light.  Neck:      Musculoskeletal: Normal range of motion and neck supple.     Thyroid: No thyromegaly.  Cardiovascular:     Rate and Rhythm: Normal rate and regular rhythm.     Heart sounds: Normal heart sounds.  Pulmonary:     Effort: Pulmonary effort is normal. No respiratory distress.     Breath sounds: Normal breath sounds. No wheezing.  Abdominal:     General: There is no distension.     Palpations: Abdomen is soft.     Tenderness: There is no abdominal tenderness.  Musculoskeletal: Normal range of motion.        General: No tenderness.       Legs:  Lymphadenopathy:     Cervical: No cervical adenopathy.  Skin:    General: Skin is warm and dry.  Neurological:     Mental Status: He is alert and oriented to person, place, and time.     Deep Tendon Reflexes: Reflexes are normal and symmetric.  Psychiatric:        Behavior: Behavior normal.    Vitals:   11/24/18 0900  BP: 107/68  Pulse: (!) 50  Resp: 14  Temp: 98.4 F (36.9 C)  TempSrc: Oral  SpO2: 98%  Weight: 186 lb 3.2 oz (84.5 kg)        Assessment & Plan:    DANTAVIOUS SNOWBALL is a 48 y.o. male Annual physical exam  - -  anticipatory guidance as below in AVS, screening labs above. Health maintenance items as above in HPI discussed/recommended as applicable.   Adjustment disorder with mixed anxiety and depressed mood - Plan: sertraline (ZOLOFT) 50 MG tablet  -Previous depression/anxiety symptoms that had been controlled.  Now with multiple situational stressors, familial stressors and with current pandemic.  Decided to restart SSRI, different than Lexapro given prior symptoms.    -Start Zoloft 50 mg daily, option of 25 mg dosing if side effects.  -Counseling recommended through EAP or separate number provided  -Recheck 6 weeks with my chart update in 2 to 3 weeks  Hyperlipidemia, unspecified hyperlipidemia type - Plan: Lipid Panel, Comprehensive metabolic panel  -Low ASCVD risk for previously.  Repeat labs.  Screening for thyroid  disorder - Plan: TSH  Right medial tibial stress syndrome, initial encounter  -Likely shinsplints/MTSS based on location.  No bony tenderness, doubt stress injury but RTC precautions given.  -Crosstraining/adjustment of mileage for running discussed  -Consider eval with current sports medicine for running/foot analysis if persistent.  RTC precautions if worsening  Meds ordered this encounter  Medications   sertraline (ZOLOFT) 50 MG tablet    Sig: Take 1 tablet (50 mg total) by mouth daily.    Dispense:  30 tablet    Refill:  3   Patient Instructions    I do recommend meeting with counselor. EAP is great to start. Here is another separate resource for counseling: [facility]: [phone]  I also recommend restarting med - start zoloft 50mg  per day. recheck in 6 weeks, but give me an update by Mychart in next 2-3 weeks. Return to the clinic or go to the nearest emergency room if any of your symptoms worsen or new symptoms occur.  For leg symptoms/sihin splints, crosstraining with swimming,  bike or elliptical may be helpful temporarily, or adjusting mileage as we discussed.  If you continue to have symptoms, I would recommend meeting with [facility] sports [facility].  Let me know and I can place that referral.  Return to the clinic or go to the nearest emergency room if any of your symptoms worsen or new symptoms occur.  Keeping you healthy  Get these tests  Blood pressure- Have your blood pressure checked once a year by your healthcare provider.  Normal blood pressure is 120/80.  Weight- Have your body mass index (BMI) calculated to screen for obesity.  BMI is a measure of body fat based on height and weight. You can also calculate your own BMI at [employer].it.  Cholesterol- Have your cholesterol checked regularly starting at age 7, sooner may be necessary if you have diabetes, high blood pressure, if a family member developed heart diseases at an early  age or if you smoke.   Chlamydia, HIV, and other sexual transmitted disease- Get screened each year until the age of 49 then within three months of each new sexual partner.  Diabetes- Have your blood sugar checked regularly if you have high blood pressure, high cholesterol, a family history of diabetes or if you are overweight.  Get these vaccines  Flu shot- Every fall.  Tetanus shot- Every 10 years.  Menactra- Single dose; prevents meningitis.  Take these steps  Don't smoke- If you do smoke, ask your healthcare provider about quitting. For tips on how to quit, go to www.smokefree.gov or call 1-800-QUIT-NOW.  Be physically active- Exercise 5 days a week for at least 30 minutes.  If you are not already physically active start slow and  gradually work up to 30 minutes of moderate physical activity.  Examples of moderate activity include walking briskly, mowing the yard, dancing, swimming bicycling, etc.  Eat a healthy diet- Eat a variety of healthy foods such as fruits, vegetables, low fat milk, low fat cheese, yogurt, lean meats, poultry, fish, beans, tofu, etc.  For more information on healthy eating, go to www.thenutritionsource.org  Drink alcohol in moderation- Limit alcohol intake two drinks or less a day.  Never drink and drive.  Dentist- Brush and floss teeth twice daily; visit your dentis twice a year.  Depression-Your emotional health is as important as your physical health.  If you're feeling down, losing interest in things you normally enjoy please talk with your healthcare provider.  Gun Safety- If you keep a gun in your home, keep it unloaded and with the safety lock on.  Bullets should be stored separately.  Helmet use- Always wear a helmet when riding a motorcycle, bicycle, rollerblading or skateboarding.  Safe sex- If you may be exposed to a sexually transmitted infection, use a condom  Seat belts- Seat bels can save your life; always wear one.  Smoke/Carbon Monoxide  detectors- These detectors need to be installed on the appropriate level of your home.  Replace batteries at least once a year.  Skin Cancer- When out in the sun, cover up and use sunscreen SPF 15 or higher.  Violence- If anyone is threatening or hurting you, please tell your healthcare provider.   Shin Splints  Shin splints is a painful condition that is felt either on the bone that is located in the front of the lower leg (tibia or shin bone) or in the muscles on either side of the bone. This condition happens when physical activities lead to inflammation of the muscles, tendons, and the thin layer of tissue that covers the shin bone. It may result from participating in sports or other intense exercise. What are the causes? This condition may be caused by:  Overuse of muscles.  Repetitive activities.  Flat feet or rigid arches. Activities that could contribute to shin splints include:  Having a sudden increase in exercise time.  Starting a new, intense activity.  Running up hills or long distances.  Playing sports that involve sudden starts and stops.  Not warming up before activity.  Wearing old or worn-out shoes. What are the signs or symptoms? The main symptom of this condition is pain that occurs:  On the front of the lower leg.  In the muscles on either side of the shin bone.  While exercising or at rest. How is this diagnosed? This condition may be diagnosed based on:  A physical exam.  Your symptoms.  An observation of you while you are walking or running.  X-rays or other imaging tests. These may be done to rule out other problems. How is this treated? Treatment for this condition depends on your age, history, overall health, and how bad the pain is. Most cases of shin splints can be managed by doing one or more of the following:  Resting.  Reducing the length and intensity of your exercise.  Stopping the activity that causes shin pain.  Taking  medicines to control the inflammation.  Icing, massaging, stretching, and strengthening the affected area.  Wearing shoes that have rigid heels, shock absorption, and a good arch support. For severe shin pain, your health care provider may recommend that you use crutches to avoid putting weight on your legs. Follow these instructions at home:  Medicines  Take over-the-counter and prescription medicines only as told by your health care provider.  Do not drive or use heavy machinery while taking prescription pain medicine. Managing pain, stiffness, and swelling      If directed, apply ice to the painful area. Icing can help to relieve pain and swelling. ? Put ice in a plastic bag. ? Place a towel between your skin and the bag. ? Leave the ice on for 20 minutes, 2-3 times a day.  If directed, apply heat to the painful area before stretching exercises, or as told by your health care provider. Heat can help to relax your muscles. Use the heat source that your health care provider recommends, such as a moist heat pack or a heating pad. ? Place a towel between your skin and the heat source. ? Leave the heat on for 20-30 minutes. ? Remove the heat if your skin turns bright red. This is especially important if you are unable to feel pain, heat, or cold. You may have a greater risk of getting burned.  Massage, stretch, and strengthen the affected area as directed by your health care provider.  Wear compression sleeves or socks as told by your health care provider.  Raise (elevate) your legs above the level of your heart while you are sitting or lying down. Activity  Rest as needed. Return to activity gradually as told by your health care provider.  When you start exercising again, begin with non-weight-bearing exercises, such as cycling or swimming.  Stop running if the pain returns.  Warm up properly before exercising.  Run on a surface that is level and fairly firm.  Gradually  change the intensity of an exercise.  If you increase your running distance, add only 5-10% to your distance each week. This means that if you are running 5 miles this week, you should only increase your run by - mile for next week. General instructions  Wear shoes that have rigid heels, shock absorption, and a good arch support.  Change your athletic shoes every 6 months, or every 350-450 miles.  Keep all follow-up visits as told by your health care provider. This is important. Contact a health care provider if:  Your symptoms continue, or they worsen even after treatment.  The location, intensity, or type of pain changes over time.  You have swelling in your lower leg that gets worse.  Your shin becomes red and feels warm. Get help right away if:  You have severe pain.  You have trouble walking. Summary  Shin splints happens when physical activities lead to inflammation of the muscles, tendons, and the thin layer of tissue that covers the shin bone.  Treatments may include medicines, resting, and icing.  Return to activity gradually as directed by your health care provider.  Make sure you know what symptoms should cause you to contact your health care provider. This information is not intended to replace advice given to you by your health care provider. Make sure you discuss any questions you have with your health care provider. Document Released: 03/21/2000 Document Revised: 04/13/2017 Document Reviewed: 04/13/2017 Elsevier Patient Education  [place].   Adjustment Disorder, Adult Adjustment disorder is a group of symptoms that can develop after a stressful life event, such as the loss of a job or serious physical illness. The symptoms can affect how you feel, think, and act. They may interfere with your relationships. Adjustment disorder increases your risk of suicide and substance abuse. If this  disorder is not managed early, it can develop into a more serious  condition, such as major depressive disorder or post-traumatic stress disorder. What are the causes? This condition happens when you have trouble recovering from or coping with a stressful life event. What increases the risk? You are more likely to develop this condition if:  You have had depression or anxiety.  You are being treated for a long-term (chronic) illness.  You are being treated for an illness that cannot be cured (terminal illness).  You have a family history of mental illness. What are the signs or symptoms? Symptoms of this condition include:  Extreme trouble doing daily tasks, such as going to work.  Sadness, depression, or crying spells.  Worrying a lot.  Loss of enjoyment.  Change in appetite or weight.  Feelings of loss or hopelessness.  Thoughts of suicide.  Anxiety, worry, or nervousness.  Trouble sleeping.  Avoiding family and friends.  Fighting or vandalism.  Complaining of feeling sick without being ill.  Feeling dazed or disconnected.  Nightmares.  Trouble sleeping.  Irritability.  Reckless driving.  Poor work Systems analyst.  Ignoring bills. Symptoms of this condition start within three months of the stressful event. They do not last more than six months, unless the stressful circumstances last longer. Normal grieving after the death of a loved one is not a symptom of this condition. How is this diagnosed? To diagnose this condition, your health care provider will ask about what has happened in your life and how it has affected you. He or she may also ask about your medical history and your use of medicines, alcohol, and other substances. Your health care provider may do a physical exam and order lab tests or other studies. You may be referred to a mental health specialist. How is this treated? Treatment options for this condition include:  Counseling or talk therapy. Talk therapy is usually provided by mental health  specialists.  Medicines. Certain medicines may help with depression, anxiety, and sleep.  Support groups. These offer emotional support, advice, and guidance. They are made up of people who have had similar experiences.  Observation and time. This is sometimes called "watchful waiting." In this treatment, health care providers monitor your health and behavior without other treatment. Adjustment disorder sometimes gets better on its own with time. Follow these instructions at home:  Take over-the-counter and prescription medicines only as told by your health care provider.  Keep all follow-up visits as told by your health care provider. This is important. Contact a health care provider if:  Your symptoms do not improve in six months.  Your symptoms get worse. Get help right away if:  You have serious thoughts about hurting yourself or someone else. If you ever feel like you may hurt yourself or others, or have thoughts about taking your own life, get help right away. You can go to your nearest emergency department or call:  Your local emergency services (911 in the [place]  A suicide crisis helpline, such as the [facility] at 862-887-5553. This is open 24 hours a day. Summary  Adjustment disorder is a group of symptoms that can develop after a stressful life event, such as the loss of a job or serious physical illness. The symptoms can affect how you feel, think, and act. They may interfere with your relationships.  Symptoms of this condition start within three months of the stressful event. They do not last more than six months, unless the stressful  circumstances last longer.  Treatment may include talk therapy, medicines, participation in a support group, or observation to see if symptoms improve.  Contact your health care provider if your symptoms get worse or do not improve in six months.  If you ever feel like you may hurt yourself or others, or have  thoughts about taking your own life, get help right away. This information is not intended to replace advice given to you by your health care provider. Make sure you discuss any questions you have with your health care provider. Document Released: 11/26/2005 Document Revised: 03/06/2017 Document Reviewed: 05/23/2016 Elsevier Patient Education  [employer].   If you have lab work done today you will be contacted with your lab results within the next 2 weeks.  If you have not heard from [place] then please contact us. The fastest way to get your results is to register for My Chart.   IF you received an x-ray today, you will receive an invoice from [facility]. Please contact [facility] at [phone] with questions or concerns regarding your invoice.   IF you received labwork today, you will receive an invoice from [facility]. Please contact [facility] at [phone] with questions or concerns regarding your invoice.   Our billing staff will not be able to assist you with questions regarding bills from these companies.  You will be contacted with the lab results as soon as they are available. The fastest way to get your results is to activate your My Chart account. Instructions are located on the last page of this paperwork. If you have not heard from [place] regarding the results in 2 weeks, please contact this office.

## 2018-11-25 LAB — COMPREHENSIVE METABOLIC PANEL
ALT: 13 IU/L (ref 0–44)
AST: 22 IU/L (ref 0–40)
Albumin/Globulin Ratio: 1.6 (ref 1.2–2.2)
Albumin: 4.6 g/dL (ref 4.0–5.0)
Alkaline Phosphatase: 62 IU/L (ref 39–117)
BUN/Creatinine Ratio: 13 (ref 9–20)
BUN: 12 mg/dL (ref 6–24)
Bilirubin Total: 0.5 mg/dL (ref 0.0–1.2)
CO2: 24 mmol/L (ref 20–29)
Calcium: 9.6 mg/dL (ref 8.7–10.2)
Chloride: 101 mmol/L (ref 96–106)
Creatinine, Ser: 0.92 mg/dL (ref 0.76–1.27)
GFR calc Af Amer: 113 mL/min/{1.73_m2} (ref >59)
GFR calc non Af Amer: 97 mL/min/{1.73_m2} (ref >59)
Globulin, Total: 2.9 g/dL (ref 1.5–4.5)
Glucose: 90 mg/dL (ref 65–99)
Potassium: 4.6 mmol/L (ref 3.5–5.2)
Sodium: 139 mmol/L (ref 134–144)
Total Protein: 7.5 g/dL (ref 6.0–8.5)

## 2018-11-25 LAB — LIPID PANEL
Chol/HDL Ratio: 3.1 ratio (ref 0.0–5.0)
Cholesterol, Total: 220 mg/dL — ABNORMAL HIGH (ref 100–199)
HDL: 70 mg/dL (ref >39)
LDL Calculated: 135 mg/dL — ABNORMAL HIGH (ref 0–99)
Triglycerides: 73 mg/dL (ref 0–149)
VLDL Cholesterol Cal: 15 mg/dL (ref 5–40)

## 2018-11-25 LAB — TSH: TSH: 2.07 u[IU]/mL (ref 0.450–4.500)

## 2018-12-23 MED FILL — SERTRALINE HCL 50 MG TABLET: 50 | 30 days supply | Qty: 30 | Fill #1

## 2018-12-23 MED FILL — OMEPRAZOLE 20 MG CAPSULE DR: 20 | 90 days supply | Qty: 90 | Fill #0

## 2019-01-03 ENCOUNTER — Other Ambulatory Visit: Payer: Self-pay

## 2019-01-03 ENCOUNTER — Telehealth (INDEPENDENT_AMBULATORY_CARE_PROVIDER_SITE_OTHER): Payer: 59 | Admitting: Family Medicine

## 2019-01-03 ENCOUNTER — Ambulatory Visit: Payer: 59

## 2019-01-03 DIAGNOSIS — M898X6 Other specified disorders of bone, lower leg: Secondary | ICD-10-CM

## 2019-01-03 DIAGNOSIS — F5104 Psychophysiologic insomnia: Secondary | ICD-10-CM | POA: Diagnosis not present

## 2019-01-03 DIAGNOSIS — F4323 Adjustment disorder with mixed anxiety and depressed mood: Secondary | ICD-10-CM

## 2019-01-03 MED ORDER — SERTRALINE HCL 50 MG PO TABS: 50.0000 mg | ORAL_TABLET | Freq: Every day | ORAL | 0 refills | Status: DC

## 2019-01-03 MED ORDER — HYDROXYZINE HCL 25 MG PO TABS
12.5000 mg | ORAL_TABLET | Freq: Every evening | ORAL | 0 refills | Status: DC | PRN
Start: 2019-01-03 — End: 2019-02-21

## 2019-01-03 MED FILL — hydrOXYzine HCL 25 MG TABS: 25 | 30 days supply | Qty: 30 | Fill #0

## 2019-01-03 NOTE — Progress Notes (Signed)
Virtual Visit via Video Note  I connected with Darren Best on 01/03/19 at 10:20 AM by a video enabled telemedicine application and verified that I am speaking with the correct person using two identifiers.   I discussed the limitations, risks, security and privacy concerns of performing an evaluation and management service by telephone and the availability of in person appointments. I also discussed with the patient that there may be a patient responsible charge related to this service. The patient expressed understanding and agreed to proceed, consent obtained  Chief complaint:  Depression/anxiety symptoms, med review   History of Present Illness: Darren Best is a 48 y.o. male  Depression/anxiety: Discussed at August 19 physical.  Increase situational stressors, family stressors and current pandemic stressors.  Restarted Zoloft 50 mg daily, counseling through [facility] discussed.  zoloft seems to be working better. Initial fatigue subsided. Still some stressors, but not quite as overwhelmed. Has tried melatonin, still some sleeping issues.  Benadryl causes grogginess.  Alcohol - 10 per week.   Has not met with [facility]. Plans on calling.   Depression screen [facility] 2/9 01/03/2019 11/24/2018 03/11/2018 02/09/2018 11/30/2017  Decreased Interest 0 1 0 0 1  Down, Depressed, Hopeless 0 1 0 0 1  PHQ - 2 Score 0 2 0 0 2  Altered sleeping - 2 - - 1  Tired, decreased energy - 1 - - 1  Change in appetite - 0 - - 0  Feeling bad or failure about yourself  - 1 - - 0  Trouble concentrating - 1 - - 1  Moving slowly or fidgety/restless - 0 - - 0  Suicidal thoughts - 0 - - 0  PHQ-9 Score - 7 - - 5  Difficult doing work/chores - - - - Somewhat difficult    Today's gad 7 score equals 4.  PHQ 9 equals 2. GAD 7 : Generalized Anxiety Score 11/24/2018  Nervous, Anxious, on Edge 2  Control/stop worrying 2  Worry too much - different things 2  Trouble relaxing 2  Restless 1  Easily annoyed or irritable 2  Afraid  - awful might happen 1  Total GAD 7 Score 12  Anxiety Difficulty Not difficult at all    R lower leg pain: hought to be MTSS/shin splints last visit. Better off running, but then hurts after 3 mile run - notices some during run, but worse that night or next am. No pain with spinning/biking.  Pain overall same.    Patient Active Problem List   Diagnosis Date Noted  . Pain of left hip joint 12/02/2016  . Eosinophilic esophagitis 26/83/4196  . IBS (irritable bowel syndrome) 05/15/2015  . Cyst, eyelid sebaceous 03/30/2013   Past Medical History:  Diagnosis Date  . Allergy   . Dysphagia   . Eosinophilic esophagitis   . GERD (gastroesophageal reflux disease)   . History of shingles   . Irritable bowel syndrome    Past Surgical History:  Procedure Laterality Date  . EYE SURGERY    . lasix surgery     Allergies  Allergen Reactions  . Almond Oil Bitter Flavor [Flavoring Agent]   . Milk-Related Compounds    Prior to Admission medications   Medication Sig Start Date End Date Taking? Authorizing Provider  famotidine (PEPCID) 20 MG tablet Take 20 mg by mouth daily as needed for heartburn or indigestion.    Yes [provider]  fexofenadine (ALLEGRA) 180 MG tablet Take 180 mg by mouth daily.    Yes  [provider]  hyoscyamine (LEVSIN, ANASPAZ) 0.125 MG tablet Take 1 tablet (0.125 mg total) by mouth every 4 (four) hours as needed for cramping. 07/20/18  Yes Armbruster, Carlota Raspberry, MD  ibuprofen (ADVIL,MOTRIN) 200 MG tablet Take 200 mg by mouth every 6 (six) hours as needed.   Yes [provider]  omeprazole (PRILOSEC) 20 MG capsule Take 1 capsule (20 mg total) by mouth daily. 07/20/18  Yes Armbruster, Carlota Raspberry, MD  ondansetron (ZOFRAN) 4 MG tablet Take 1 tablet (4 mg total) by mouth every 8 (eight) hours as needed for nausea or vomiting. 08/03/16  Yes [name], NP  sertraline (ZOLOFT) 50 MG tablet Take 1 tablet (50 mg total) by mouth daily. 11/24/18  Yes  [name], MD   Social History   Socioeconomic History  . Marital status: Married    Spouse name: Not on file  . Number of children: 0  . Years of education: Not on file  . Highest education level: Not on file  Occupational History  . 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 Financial resource strain: Not on file  . Food insecurity    Worry: Not on file    Inability: Not on file  . Transportation needs    Medical: Not on file    Non-medical: Not on file  Tobacco Use  . Smoking status: Former Smoker    Types: Cigarettes  . Smokeless tobacco: Never Used  Substance and Sexual Activity  . Alcohol use: Yes    Comment: 2 drinks daily - 2 beers max  . Drug use: No  . Sexual activity: Yes    Comment: number of sex partners in the last 12 months 1  Lifestyle  . Physical activity    Days per week: Not on file    Minutes per session: Not on file  . Stress: Not on file  Relationships  . Social Herbalist on phone: Not on file    Gets together: Not on file    Attends religious service: Not on file    Active member of club or organization: Not on file    Attends meetings of clubs or organizations: Not on file    Relationship status: Not on file  . Intimate partner violence    Fear of current or ex partner: Not on file    Emotionally abused: Not on file    Physically abused: Not on file    Forced sexual activity: Not on file  Other Topics Concern  . Not on file  Social History Narrative   College degree   [employer]   2-3 caffeine drinks daily   Exercise cycling and gym classes 2-3 times per week for 1-2 hours     Observations/Objective: Euthymic mood, slightly flat affect.  Appropriate responses, no distress.  All questions answered.   Assessment and Plan: Pain of right tibia - Plan: DG Tibia/Fibula Right, Ambulatory referral to Sports Medicine  -Medial tibial stress syndrome versus stress injury.   Decrease offending activities, specifically running.  Crosstraining with cycling okay for now.  Check x-ray to rule out fracture, then follow-up with sports medicine for possible ultrasound of area and assessment of foot biomechanics.  Advanced imaging also possible depending on that exam.  Adjustment disorder with mixed anxiety and depressed mood - Plan: sertraline (ZOLOFT) 50 MG tablet  -Improved, tolerating Zoloft at this point.  Continue  same dose.  EAP program discussed for counseling, recheck 3 months  Psychophysiological insomnia - Plan: hydrOXYzine (ATARAX/VISTARIL) 25 MG tablet  -Continue melatonin, handout on insomnia and nonpharmacologic treatments.  Option of low-dose hydroxyzine with potential side effects and risk discussed.  Counseling as above may also help.  Follow Up Instructions:   3 months.  I discussed the assessment and treatment plan with the patient. The patient was provided an opportunity to ask questions and all were answered. The patient agreed with the plan and demonstrated an understanding of the instructions.   The patient was advised to call back or seek an in-person evaluation if the symptoms worsen or if the condition fails to improve as anticipated.  I provided 14 minutes of non-face-to-face time during this encounter.   [name], MD

## 2019-01-03 NOTE — Patient Instructions (Addendum)
Continue Zoloft same dose.  Okay to continue melatonin at night, but I did write for hydroxyzine to see if that is more effective.  Start that Friday night as it can cause sedation.  Start with a half a pill.  If that is still too sedating, let me know.  Meeting with counselor/[facility] may also be helpful.  See other information below for insomnia.  [facility] for x-ray of leg and I will place a referral to sports medicine.  Avoid activities that cause more pain in that leg, especially running or distance running.  If pain-free, spinning is okay.  Recheck 3 months, but let me know if there are questions sooner.   Return to the clinic or go to the nearest emergency room if any of your symptoms worsen or new symptoms occur.   Insomnia Insomnia is a sleep disorder that makes it difficult to fall asleep or stay asleep. Insomnia can cause fatigue, low energy, difficulty concentrating, mood swings, and poor performance at work or school. There are three different ways to classify insomnia:  Difficulty falling asleep.  Difficulty staying asleep.  Waking up too early in the morning. Any type of insomnia can be long-term (chronic) or short-term (acute). Both are common. Short-term insomnia usually lasts for three months or less. Chronic insomnia occurs at least three times a week for longer than three months. What are the causes? Insomnia may be caused by another condition, situation, or substance, such as:  Anxiety.  Certain medicines.  Gastroesophageal reflux disease (GERD) or other gastrointestinal conditions.  Asthma or other breathing conditions.  Restless legs syndrome, sleep apnea, or other sleep disorders.  Chronic pain.  Menopause.  Stroke.  Abuse of alcohol, tobacco, or illegal drugs.  Mental health conditions, such as depression.  Caffeine.  Neurological disorders, such as Alzheimer's disease.  An overactive thyroid (hyperthyroidism). Sometimes, the cause of  insomnia may not be known. What increases the risk? Risk factors for insomnia include:  Gender. Women are affected more often than men.  Age. Insomnia is more common as you get older.  Stress.  Lack of exercise.  Irregular work schedule or working night shifts.  Traveling between different time zones.  Certain medical and mental health conditions. What are the signs or symptoms? If you have insomnia, the main symptom is having trouble falling asleep or having trouble staying asleep. This may lead to other symptoms, such as:  Feeling fatigued or having low energy.  Feeling nervous about going to sleep.  Not feeling rested in the morning.  Having trouble concentrating.  Feeling irritable, anxious, or depressed. How is this diagnosed? This condition may be diagnosed based on:  Your symptoms and medical history. Your health care provider may ask about: ? Your sleep habits. ? Any medical conditions you have. ? Your mental health.  A physical exam. How is this treated? Treatment for insomnia depends on the cause. Treatment may focus on treating an underlying condition that is causing insomnia. Treatment may also include:  Medicines to help you sleep.  Counseling or therapy.  Lifestyle adjustments to help you sleep better. Follow these instructions at home: Eating and drinking   Limit or avoid alcohol, caffeinated beverages, and cigarettes, especially close to bedtime. These can disrupt your sleep.  Do not eat a large meal or eat spicy foods right before bedtime. This can lead to digestive discomfort that can make it hard for you to sleep. Sleep habits   Keep a sleep diary to help you and your  health care provider figure out what could be causing your insomnia. Write down: ? When you sleep. ? When you wake up during the night. ? How well you sleep. ? How rested you feel the next day. ? Any side effects of medicines you are taking. ? What you eat and  drink.  Make your bedroom a dark, comfortable place where it is easy to fall asleep. ? Put up shades or blackout curtains to block light from outside. ? Use a white noise machine to block noise. ? Keep the temperature cool.  Limit screen use before bedtime. This includes: ? Watching TV. ? Using your smartphone, tablet, or computer.  Stick to a routine that includes going to bed and waking up at the same times every day and night. This can help you fall asleep faster. Consider making a quiet activity, such as reading, part of your nighttime routine.  Try to avoid taking naps during the day so that you sleep better at night.  Get out of bed if you are still awake after 15 minutes of trying to sleep. Keep the lights down, but try reading or doing a quiet activity. When you feel sleepy, go back to bed. General instructions  Take over-the-counter and prescription medicines only as told by your health care provider.  Exercise regularly, as told by your health care provider. Avoid exercise starting several hours before bedtime.  Use relaxation techniques to manage stress. Ask your health care provider to suggest some techniques that may work well for you. These may include: ? Breathing exercises. ? Routines to release muscle tension. ? Visualizing peaceful scenes.  Make sure that you drive carefully. Avoid driving if you feel very sleepy.  Keep all follow-up visits as told by your health care provider. This is important. Contact a health care provider if:  You are tired throughout the day.  You have trouble in your daily routine due to sleepiness.  You continue to have sleep problems, or your sleep problems get worse. Get help right away if:  You have serious thoughts about hurting yourself or someone else. If you ever feel like you may hurt yourself or others, or have thoughts about taking your own life, get help right away. You can go to your nearest emergency department or  call:  Your local emergency services (911 in the [place]  A suicide crisis helpline, such as the [facility] at 502-696-1540. This is open 24 hours a day. Summary  Insomnia is a sleep disorder that makes it difficult to fall asleep or stay asleep.  Insomnia can be long-term (chronic) or short-term (acute).  Treatment for insomnia depends on the cause. Treatment may focus on treating an underlying condition that is causing insomnia.  Keep a sleep diary to help you and your health care provider figure out what could be causing your insomnia. This information is not intended to replace advice given to you by your health care provider. Make sure you discuss any questions you have with your health care provider. Document Released: 03/21/2000 Document Revised: 03/06/2017 Document Reviewed: 01/01/2017 Elsevier Patient Education  2020 [employer].

## 2019-01-03 NOTE — Progress Notes (Signed)
CC- 6 week f/u on new  Medication- Patient stated he is doing well on medication. No issues and no side effects. Mood has been better since starting this med. GAD7=4, PHQ9=2

## 2019-01-10 ENCOUNTER — Encounter: Payer: Self-pay | Admitting: Family Medicine

## 2019-01-11 ENCOUNTER — Ambulatory Visit
Admission: RE | Admit: 2019-01-11 | Discharge: 2019-01-11 | Disposition: A | Discharge status: Home / Self Care | Payer: 59 | Source: Ambulatory Visit | Attending: Family Medicine | Admitting: Family Medicine

## 2019-01-11 ENCOUNTER — Other Ambulatory Visit: Payer: Self-pay

## 2019-01-11 ENCOUNTER — Other Ambulatory Visit: Payer: Self-pay | Admitting: Family Medicine

## 2019-01-11 DIAGNOSIS — M79661 Pain in right lower leg: Secondary | ICD-10-CM | POA: Diagnosis not present

## 2019-01-11 DIAGNOSIS — R52 Pain, unspecified: Secondary | ICD-10-CM

## 2019-01-24 MED FILL — SERTRALINE HCL 50 MG TABLET: 50 | 30 days supply | Qty: 30 | Fill #2

## 2019-02-03 ENCOUNTER — Encounter: Payer: Self-pay | Admitting: Sports Medicine

## 2019-02-03 ENCOUNTER — Ambulatory Visit (INDEPENDENT_AMBULATORY_CARE_PROVIDER_SITE_OTHER): Payer: 59 | Admitting: Sports Medicine

## 2019-02-03 ENCOUNTER — Other Ambulatory Visit: Payer: Self-pay

## 2019-02-03 VITALS — BP 112/78 | Ht 71.0 in | Wt 185.0 lb

## 2019-02-03 DIAGNOSIS — S86891A Other injury of other muscle(s) and tendon(s) at lower leg level, right leg, initial encounter: Secondary | ICD-10-CM

## 2019-02-03 NOTE — Progress Notes (Signed)
   Subjective:    Patient ID: Darren Best, male    DOB: 1970-11-17, 48 y.o.   MRN: [id number]  HPI chief complaint: Right lower leg pain  Darren Best is a very nice 48 year old male runner who comes in today complaining of several months of intermittent right lower leg pain.  He denies any trauma but rather describes a gradual onset of pain that he localizes to the medial aspect of the right distal tibia.  He enjoys running 2-3 times a week and runs on average about 3 miles each time.  He began to experience leg pain after a change in running shoes.  He has since changed back to his old running shoes but his pain persists.  He did take some time off from running and his pain improved however it returned almost immediately when he returned to running.  He has not noticed any swelling.  Pain does not radiate.  No numbness or tingling.  He has tried some Tylenol.  He denies similar problems in the past.  No significant pain with walking.  Past medical history reviewed Medications reviewed Allergies reviewed  Review of Systems As above    Objective:   Physical Exam  Well-developed, well-nourished.  No acute distress.  Awake alert and oriented x3.  Vital signs reviewed  Right lower leg: Patient has tenderness to palpation along the medial tibial border distally.  No tenderness to palpation directly over the tibia.  No tenderness to palpation with percussion over the tibia.  Negative hop test.  Patient has a mild cavus foot.  Slight pronation with running.  X-rays of the right lower leg are unremarkable  Limited [facility] ultrasound of the right lower leg performed today in the office shows no obvious evidence of tibial stress fracture.  There is some nonspecific soft tissue swelling along the distal medial tibial border consistent with probable medial tibial stress syndrome.      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  I have asked that he limit his running to twice weekly and wait 2 to 3 days in between runs.  He has purchased a compression sleeve and I would like for him to continue to use this when running.  I have also recommended over-the-counter Voltaren gel to be applied twice daily.  He will start daily heel walk and toe walk exercises.  He will also ice post running.  Follow-up with me in 4 to 5 weeks for reevaluation if still symptomatic.  Consider merits of further diagnostic imaging if symptoms persist.  Call with questions or concerns in the interim.

## 2019-02-03 NOTE — Patient Instructions (Signed)
  It was great seeing you today!  Try the inserts in your running shoes. Do heel walks and toe walks daily. Get some Voltaren gel and apply it twice daily. Be sure to ice your lower leg after running for about 10 minutes. Wear your compression sleeve when running. I would recommend that you reduce your running to twice weekly with at least 2 days of rest in between runs.  Once you are feeling better you can increase this by 10 %/week. We will schedule a follow-up visit in 4 to 5 weeks but you can cancel that if you are feeling better.

## 2019-02-21 ENCOUNTER — Other Ambulatory Visit: Payer: Self-pay | Admitting: Family Medicine

## 2019-02-21 DIAGNOSIS — F5104 Psychophysiologic insomnia: Secondary | ICD-10-CM

## 2019-02-21 MED FILL — hydrOXYzine HCL 25 MG TABS: 25 | 30 days supply | Qty: 30 | Fill #0

## 2019-02-21 MED FILL — SERTRALINE HCL 50 MG TABLET: 50 | 30 days supply | Qty: 30 | Fill #3

## 2019-03-07 ENCOUNTER — Encounter: Payer: Self-pay | Admitting: Gastroenterology

## 2019-03-08 ENCOUNTER — Encounter: Payer: Self-pay | Admitting: Sports Medicine

## 2019-03-08 ENCOUNTER — Other Ambulatory Visit: Payer: Self-pay

## 2019-03-08 ENCOUNTER — Ambulatory Visit (INDEPENDENT_AMBULATORY_CARE_PROVIDER_SITE_OTHER): Payer: 59 | Admitting: Sports Medicine

## 2019-03-08 VITALS — BP 120/80 | Ht 71.0 in | Wt 187.0 lb

## 2019-03-08 DIAGNOSIS — S86891D Other injury of other muscle(s) and tendon(s) at lower leg level, right leg, subsequent encounter: Secondary | ICD-10-CM | POA: Diagnosis not present

## 2019-03-08 DIAGNOSIS — M79604 Pain in right leg: Secondary | ICD-10-CM | POA: Diagnosis not present

## 2019-03-08 DIAGNOSIS — G8929 Other chronic pain: Secondary | ICD-10-CM

## 2019-03-08 NOTE — Progress Notes (Signed)
PCP: [name], MD  Subjective:   HPI: Patient is a 48 y.o. male here for follow-up of right shin pain.  Patient's pain has been present for the last several months.  Patient was last seen at the end of October.  At that time he was diagnosed with medial tibial stress syndrome.  He has had x-rays which did not show any abnormalities and also had a limited diagnostic ultrasound of his right tibia showing some diffuse edema however no focal evidence of a stress reaction.  Patient notes since his last visit he has significantly cut back on the amount that he has been running and walking.  He is now only running once a week for only 2 or 3 miles.  Patient notes last week after he did one of his runs he developed significant pain and could not walk for several days.  He did have a run last night which caused him less pain however still notes ongoing discomfort with activity.  The pain seems to be coming earlier into his activity than previously.  Patient denies any bruising or swelling.  He denies any numbness or tingling.  Patient denies any specific injury or trauma.  Patient's pain is currently 2 out of 10.  He notes the pain has a sharp stabbing quality when it is present.   Review of Systems: See HPI above.  Past Medical History:  Diagnosis Date  . Allergy   . Dysphagia   . Eosinophilic esophagitis   . GERD (gastroesophageal reflux disease)   . History of shingles   . Irritable bowel syndrome     Current Outpatient Medications on File Prior to Visit  Medication Sig Dispense Refill  . famotidine (PEPCID) 20 MG tablet Take 20 mg by mouth daily as needed for heartburn or indigestion.     . fexofenadine (ALLEGRA) 180 MG tablet Take 180 mg by mouth daily.     . hydrOXYzine (ATARAX/VISTARIL) 25 MG tablet TAKE 1/2 TO 1 TABLET BY MOUTH AT BEDTIME AS NEEDED FOR ANXIETY 30 tablet 0  . hyoscyamine (LEVSIN, ANASPAZ) 0.125 MG tablet Take 1 tablet (0.125 mg total) by mouth every 4 (four) hours as  needed for cramping. 90 tablet 3  . ibuprofen (ADVIL,MOTRIN) 200 MG tablet Take 200 mg by mouth every 6 (six) hours as needed.    Marland Kitchen omeprazole (PRILOSEC) 20 MG capsule Take 1 capsule (20 mg total) by mouth daily. 90 capsule 3  . ondansetron (ZOFRAN) 4 MG tablet Take 1 tablet (4 mg total) by mouth every 8 (eight) hours as needed for nausea or vomiting. 12 tablet 0  . sertraline (ZOLOFT) 50 MG tablet Take 1 tablet (50 mg total) by mouth daily. 90 tablet 0   No current facility-administered medications on file prior to visit.     Past Surgical History:  Procedure Laterality Date  . EYE SURGERY    . lasix surgery      Allergies  Allergen Reactions  . Almond Oil Bitter Flavor [Flavoring Agent]   . Milk-Related Compounds     Social History   Socioeconomic History  . Marital status: Married    Spouse name: Not on file  . Number of children: 0  . Years of education: Not on file  . Highest education level: Not on file  Occupational History  . 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 Financial resource strain: Not on file  . Food insecurity  Worry: Not on file    Inability: Not on file  . Transportation needs    Medical: Not on file    Non-medical: Not on file  Tobacco Use  . Smoking status: Former Smoker    Types: Cigarettes  . Smokeless tobacco: Never Used  Substance and Sexual Activity  . Alcohol use: Yes    Comment: 2 drinks daily - 2 beers max  . Drug use: No  . Sexual activity: Yes    Comment: number of sex partners in the last 12 months 1  Lifestyle  . Physical activity    Days per week: Not on file    Minutes per session: Not on file  . Stress: Not on file  Relationships  . Social Herbalist on phone: Not on file    Gets together: Not on file    Attends religious service: Not on file    Active member of club or organization: Not on file    Attends meetings of clubs or organizations: Not on file     Relationship status: Not on file  . Intimate partner violence    Fear of current or ex partner: Not on file    Emotionally abused: Not on file    Physically abused: Not on file    Forced sexual activity: Not on file  Other Topics Concern  . Not on file  Social History Narrative   College degree   [employer]   2-3 caffeine drinks daily   Exercise cycling and gym classes 2-3 times per week for 1-2 hours     Family History  Problem Relation Age of Onset  . Depression Father   . Heart disease Father        some coronary issues  . Aneurysm Father        kidney  . Mental illness Father   . Stroke Father   . Colon cancer Paternal Grandmother   . Diabetes Mother        adult onset  . Diabetes Brother         Objective:  Physical Exam: BP 120/80   Ht 5\' 11"  (1.803 m)   Wt 187 lb (84.8 kg)   BMI 26.08 kg/m  Gen: NAD, comfortable in exam room Lungs: Breathing comfortably on room air Right lower extremity: No deformity no bruising.  Patient has tenderness palpation over the posterior aspect of his distal tibia.  Patient did have tenderness to percussion over the tibia.  Patient had a negative hop test. Bilateral feet: Mild cavus deformity bilaterally Gait: Pronation with walking   Assessment & Plan:  Patient is a 48 y.o. male here for follow-up of right lower extremity pain  1.  Right lower extremity pain -As patient has had ongoing pain for several months despite activity modification and treatment with anti-inflammatories there is a concern for a stress fracture of the tibia -Patient has had nondiagnostic x-rays and ultrasound of his right lower extremity -Given his ongoing pain we will proceed with an MRI to rule out a stress reaction/stress fracture -Patient may continue to take anti-inflammatories as needed for pain -Patient will continue current activity modification  We will call patient with results of the MRI and discuss further follow-up at that  point  Patient seen and evaluated with the sports medicine fellow.  I agree with the above plan of care.  Patient has had minimal improvement with a home exercise program.  Bedside ultrasound was nondiagnostic.  X-rays were  unremarkable.  Diagnosis is still in question.  We will proceed with an MRI scan specifically to rule out a tibial stress fracture.  Phone follow-up with those results when available.  We will delineate further treatment based on those findings.

## 2019-03-24 ENCOUNTER — Other Ambulatory Visit: Payer: Self-pay | Admitting: Family Medicine

## 2019-03-24 DIAGNOSIS — F5104 Psychophysiologic insomnia: Secondary | ICD-10-CM

## 2019-03-24 MED FILL — OMEPRAZOLE 20 MG CAPSULE DR: 20 | 90 days supply | Qty: 90 | Fill #1

## 2019-03-24 MED FILL — SERTRALINE HCL 50 MG TABS: 50 | 90 days supply | Qty: 90 | Fill #0

## 2019-03-26 ENCOUNTER — Other Ambulatory Visit: Payer: Self-pay

## 2019-03-26 ENCOUNTER — Ambulatory Visit
Admission: RE | Admit: 2019-03-26 | Discharge: 2019-03-26 | Disposition: A | Discharge status: Home / Self Care | Payer: 59 | Source: Ambulatory Visit | Attending: Sports Medicine | Admitting: Sports Medicine

## 2019-03-26 DIAGNOSIS — R600 Localized edema: Secondary | ICD-10-CM | POA: Diagnosis not present

## 2019-03-26 DIAGNOSIS — S86891D Other injury of other muscle(s) and tendon(s) at lower leg level, right leg, subsequent encounter: Secondary | ICD-10-CM

## 2019-03-28 ENCOUNTER — Telehealth (INDEPENDENT_AMBULATORY_CARE_PROVIDER_SITE_OTHER): Payer: 59 | Admitting: Family Medicine

## 2019-03-28 ENCOUNTER — Other Ambulatory Visit: Payer: Self-pay

## 2019-03-28 DIAGNOSIS — F4323 Adjustment disorder with mixed anxiety and depressed mood: Secondary | ICD-10-CM | POA: Diagnosis not present

## 2019-03-28 DIAGNOSIS — F5104 Psychophysiologic insomnia: Secondary | ICD-10-CM | POA: Diagnosis not present

## 2019-03-28 MED ORDER — HYDROXYZINE HCL 25 MG PO TABS: ORAL_TABLET | ORAL | 0 refills | Status: DC

## 2019-03-28 MED ORDER — SERTRALINE HCL 50 MG PO TABS: 50.0000 mg | ORAL_TABLET | Freq: Every day | ORAL | 2 refills | Status: DC

## 2019-03-28 MED FILL — hydrOXYzine HCL 25 MG TABS: 25 | 90 days supply | Qty: 90 | Fill #0

## 2019-03-28 NOTE — Progress Notes (Signed)
Virtual Visit via Video Note  I connected with Darren Best on 03/28/19 at 2:18 PM by a video enabled telemedicine application and verified that I am speaking with the correct person using two identifiers.   I discussed the limitations, risks, security and privacy concerns of performing an evaluation and management service by telephone and the availability of in person appointments. I also discussed with the patient that there may be a patient responsible charge related to this service. The patient expressed understanding and agreed to proceed, consent obtained  Chief complaint:    History of Present Illness: Darren Best is a 48 y.o. male  Depression/anxiety Discussed August 19 physical, restarted Zoloft 50 mg daily and counseling through [facility] discussed.  Had not met with E[facility] at follow-up visit September 28.  However Zoloft seems to be working better.  Initial fatigue is subsided.  Not quite as overwhelmed with life stressors.  Tried melatonin with still some sleeping issues and Benadryl caused grogginess.  Hydroxyzine provided as option.  EAP meeting also recommended.  Doing well. zoloft seems to b helping. Hydroxyzine '25mg'$ .  has helped sleep. Melatonin nightly has been helpful. Still some stressors, but not overwhelmed. Mom's health in decline, has had conversations with siblings on helping.  Decided against meeting with E[facility]. Feels like doing ok.   Exercise. Had MRI to r/o stress fx. Spin bike at times has been easier at home.   Depression screen [facility] 2/9 03/28/2019 01/03/2019 11/24/2018 03/11/2018 02/09/2018  Decreased Interest 0 0 1 0 0  Down, Depressed, Hopeless 0 0 1 0 0  PHQ - 2 Score 0 0 2 0 0  Altered sleeping - - 2 - -  Tired, decreased energy - - 1 - -  Change in appetite - - 0 - -  Feeling bad or failure about yourself  - - 1 - -  Trouble concentrating - - 1 - -  Moving slowly or fidgety/restless - - 0 - -  Suicidal thoughts - - 0 - -  PHQ-9 Score - - 7 - -  Difficult doing  work/chores - - - - -   GAD 7 : Generalized Anxiety Score 11/24/2018  Nervous, Anxious, on Edge 2  Control/stop worrying 2  Worry too much - different things 2  Trouble relaxing 2  Restless 1  Easily annoyed or irritable 2  Afraid - awful might happen 1  Total GAD 7 Score 12  Anxiety Difficulty Not difficult at all       Patient Active Problem List   Diagnosis Date Noted  . Pain of left hip joint 12/02/2016  . Eosinophilic esophagitis 30/16/0109  . IBS (irritable bowel syndrome) 05/15/2015  . Cyst, eyelid sebaceous 03/30/2013   Past Medical History:  Diagnosis Date  . Allergy   . Dysphagia   . Eosinophilic esophagitis   . GERD (gastroesophageal reflux disease)   . History of shingles   . Irritable bowel syndrome    Past Surgical History:  Procedure Laterality Date  . EYE SURGERY    . lasix surgery     Allergies  Allergen Reactions  . Almond Oil Bitter Flavor [Flavoring Agent]   . Milk-Related Compounds    Prior to Admission medications   Medication Sig Start Date End Date Taking? Authorizing Provider  famotidine (PEPCID) 20 MG tablet Take 20 mg by mouth daily as needed for heartburn or indigestion.    Yes [provider]  fexofenadine (ALLEGRA) 180 MG tablet Take 180 mg by mouth daily.  Yes [provider]  hydrOXYzine (ATARAX/VISTARIL) 25 MG tablet TAKE 1/2 TO 1 TABLET BY MOUTH AT BEDTIME AS NEEDED FOR ANXIETY 03/24/19  Yes [name], MD  hyoscyamine (LEVSIN, ANASPAZ) 0.125 MG tablet Take 1 tablet (0.125 mg total) by mouth every 4 (four) hours as needed for cramping. 07/20/18  Yes Armbruster, Carlota Raspberry, MD  ibuprofen (ADVIL,MOTRIN) 200 MG tablet Take 200 mg by mouth every 6 (six) hours as needed.   Yes [provider]  omeprazole (PRILOSEC) 20 MG capsule Take 1 capsule (20 mg total) by mouth daily. 07/20/18  Yes Armbruster, Carlota Raspberry, MD  ondansetron (ZOFRAN) 4 MG tablet Take 1 tablet (4 mg total) by mouth every 8 (eight) hours as  needed for nausea or vomiting. 08/03/16  Yes [name], NP  sertraline (ZOLOFT) 50 MG tablet Take 1 tablet (50 mg total) by mouth daily. 01/03/19  Yes [name], MD   Social History   Socioeconomic History  . Marital status: Married    Spouse name: Not on file  . Number of children: 0  . Years of education: Not on file  . Highest education level: Not on file  Occupational History  . Occupation: Product manager: [employer]    Comment: [employer]/ [employer]  Tobacco Use  . Smoking status: Former Smoker    Types: Cigarettes  . Smokeless tobacco: Never Used  Substance and Sexual Activity  . Alcohol use: Yes    Comment: 2 drinks daily - 2 beers max  . Drug use: No  . Sexual activity: Yes    Comment: number of sex partners in the last 12 months 1  Other Topics Concern  . Not on file  Social History Narrative   College degree   [employer]   2-3 caffeine drinks daily   Exercise cycling and gym classes 2-3 times per week for 1-2 hours    Social Determinants of Health   Financial Resource Strain:   . Difficulty of Paying Living Expenses: Not on file  Food Insecurity:   . Worried About Charity fundraiser in the Last Year: Not on file  . Ran Out of Food in the Last Year: Not on file  Transportation Needs:   . Lack of Transportation (Medical): Not on file  . Lack of Transportation (Non-Medical): Not on file  Physical Activity:   . Days of Exercise per Week: Not on file  . Minutes of Exercise per Session: Not on file  Stress:   . Feeling of Stress : Not on file  Social Connections:   . Frequency of Communication with Friends and Family: Not on file  . Frequency of Social Gatherings with Friends and Family: Not on file  . Attends Religious Services: Not on file  . Active Member of Clubs or Organizations: Not on file  . Attends Archivist Meetings: Not on file  . Marital Status: Not on file  Intimate Partner Violence:   . Fear of Current or Ex-Partner:  Not on file  . Emotionally Abused: Not on file  . Physically Abused: Not on file  . Sexually Abused: Not on file    Observations/Objective:  euthymic mood, no distress, appropriate responses.  Assessment and Plan: Psychophysiological insomnia  Adjustment disorder with mixed anxiety and depressed mood Improved with Zoloft 50 mg daily, hydroxyzine 25 mg nightly. Continue exercise as tolerated (currently experiencing some medial tibial stress syndrome.  Under guidance/care of [facility]).  Refills provided,  recheck 6 months  Follow Up Instructions: 6 months   I discussed the assessment and treatment plan with the patient. The patient was provided an opportunity to ask questions and all were answered. The patient agreed with the plan and demonstrated an understanding of the instructions.   The patient was advised to call back or seek an in-person evaluation if the symptoms worsen or if the condition fails to improve as anticipated.  I provided 9 minutes of non-face-to-face time during this encounter.   [name], MD

## 2019-03-28 NOTE — Patient Instructions (Signed)
° ° ° °  If you have lab work done today you will be contacted with your lab results within the next 2 weeks.  If you have not heard from us then please contact us. The fastest way to get your results is to register for My Chart. ° ° °IF you received an x-ray today, you will receive an invoice from [facility]. Please contact [facility] at [phone] with questions or concerns regarding your invoice.  ° °IF you received labwork today, you will receive an invoice from [facility]. Please contact [facility] at [phone] with questions or concerns regarding your invoice.  ° °Our billing staff will not be able to assist you with questions regarding bills from these companies. ° °You will be contacted with the lab results as soon as they are available. The fastest way to get your results is to activate your My Chart account. Instructions are located on the last page of this paperwork. If you have not heard from us regarding the results in 2 weeks, please contact this office. °  ° ° ° °

## 2019-03-29 ENCOUNTER — Telehealth: Payer: Self-pay | Admitting: Sports Medicine

## 2019-03-29 NOTE — Telephone Encounter (Signed)
  I spoke with him on the phone today after reviewing MRI findings of his right lower leg.  Findings are consistent with medial tibial stress syndrome.  No evidence of stress fracture.  I discussed treatment going forward including continuing with his home exercise program and scaphoid pads versus formal PT versus custom orthotics.  At this point in time, the patient would like to continue to modify his running and try a couple of different pairs of shoes.  If he changes his mind and would like custom orthotics he will call the office.  Otherwise, I think he is okay to continue with his home exercises and increase his running as pain allows.  Follow-up as needed.

## 2019-04-04 ENCOUNTER — Telehealth: Payer: 59 | Admitting: Family Medicine

## 2019-04-12 ENCOUNTER — Encounter: Payer: Self-pay | Admitting: Gastroenterology

## 2019-05-03 ENCOUNTER — Ambulatory Visit (AMBULATORY_SURGERY_CENTER): Payer: Self-pay | Admitting: *Deleted

## 2019-05-03 ENCOUNTER — Other Ambulatory Visit: Payer: Self-pay

## 2019-05-03 VITALS — Temp 98.4°F | Ht 71.0 in | Wt 191.0 lb

## 2019-05-03 DIAGNOSIS — Z01818 Encounter for other preprocedural examination: Secondary | ICD-10-CM

## 2019-05-03 DIAGNOSIS — Z1211 Encounter for screening for malignant neoplasm of colon: Secondary | ICD-10-CM

## 2019-05-03 MED ORDER — SUPREP BOWEL PREP KIT 17.5-3.13-1.6 GM/177ML PO SOLN: 1.0000 | Freq: Once | ORAL | 0 refills | Status: AC

## 2019-05-03 MED FILL — SUPREP BOWEL PREP KIT: 17.5-3.13-1 | 1 days supply | Qty: 354 | Fill #0

## 2019-05-03 NOTE — Progress Notes (Signed)

## 2019-05-12 ENCOUNTER — Ambulatory Visit (INDEPENDENT_AMBULATORY_CARE_PROVIDER_SITE_OTHER): Payer: 59

## 2019-05-12 ENCOUNTER — Other Ambulatory Visit: Payer: Self-pay | Admitting: Gastroenterology

## 2019-05-12 DIAGNOSIS — Z1159 Encounter for screening for other viral diseases: Secondary | ICD-10-CM

## 2019-05-12 LAB — SARS CORONAVIRUS 2 (TAT 6-24 HRS): SARS Coronavirus 2: NEGATIVE

## 2019-05-13 ENCOUNTER — Encounter: Payer: Self-pay | Admitting: Gastroenterology

## 2019-05-17 ENCOUNTER — Other Ambulatory Visit: Payer: Self-pay

## 2019-05-17 ENCOUNTER — Ambulatory Visit (AMBULATORY_SURGERY_CENTER): Payer: 59 | Admitting: Gastroenterology

## 2019-05-17 ENCOUNTER — Encounter: Payer: Self-pay | Admitting: Gastroenterology

## 2019-05-17 VITALS — BP 104/73 | HR 51 | Temp 97.1°F | Resp 22 | Ht 71.0 in | Wt 191.0 lb

## 2019-05-17 DIAGNOSIS — K219 Gastro-esophageal reflux disease without esophagitis: Secondary | ICD-10-CM | POA: Diagnosis not present

## 2019-05-17 DIAGNOSIS — K5730 Diverticulosis of large intestine without perforation or abscess without bleeding: Secondary | ICD-10-CM | POA: Diagnosis not present

## 2019-05-17 DIAGNOSIS — Z1211 Encounter for screening for malignant neoplasm of colon: Secondary | ICD-10-CM

## 2019-05-17 DIAGNOSIS — D125 Benign neoplasm of sigmoid colon: Secondary | ICD-10-CM

## 2019-05-17 DIAGNOSIS — K648 Other hemorrhoids: Secondary | ICD-10-CM | POA: Diagnosis not present

## 2019-05-17 DIAGNOSIS — K529 Noninfective gastroenteritis and colitis, unspecified: Secondary | ICD-10-CM

## 2019-05-17 DIAGNOSIS — F419 Anxiety disorder, unspecified: Secondary | ICD-10-CM | POA: Diagnosis not present

## 2019-05-17 MED ORDER — SODIUM CHLORIDE 0.9 % IV SOLN: 500.0000 mL | Freq: Once | INTRAVENOUS | Status: DC

## 2019-05-17 NOTE — Patient Instructions (Signed)
YOU HAD AN ENDOSCOPIC PROCEDURE TODAY AT THE [facility]:   Refer to the procedure report that was given to you for any specific questions about what was found during the examination.  If the procedure report does not answer your questions, please call your gastroenterologist to clarify.  If you requested that your care partner not be given the details of your procedure findings, then the procedure report has been included in a sealed envelope for you to review at your convenience later.  YOU SHOULD EXPECT: Some feelings of bloating in the abdomen. Passage of more gas than usual.  Walking can help get rid of the air that was put into your GI tract during the procedure and reduce the bloating. If you had a lower endoscopy (such as a colonoscopy or flexible sigmoidoscopy) you may notice spotting of blood in your stool or on the toilet paper. If you underwent a bowel prep for your procedure, you may not have a normal bowel movement for a few days.  Please Note:  You might notice some irritation and congestion in your nose or some drainage.  This is from the oxygen used during your procedure.  There is no need for concern and it should clear up in a day or so.  SYMPTOMS TO REPORT IMMEDIATELY:   Following lower endoscopy (colonoscopy or flexible sigmoidoscopy):  Excessive amounts of blood in the stool  Significant tenderness or worsening of abdominal pains  Swelling of the abdomen that is new, acute  Fever of 100F or higher  For urgent or emergent issues, a gastroenterologist can be reached at any hour by calling [phone].   DIET:  We do recommend a small meal at first, but then you may proceed to your regular diet.  Drink plenty of fluids but you should avoid alcoholic beverages for 24 hours.  ACTIVITY:  You should plan to take it easy for the rest of today and you should NOT DRIVE or use heavy machinery until tomorrow (because of the sedation medicines used during the test).     FOLLOW UP: Our staff will call the number listed on your records 48-72 hours following your procedure to check on you and address any questions or concerns that you may have regarding the information given to you following your procedure. If we do not reach you, we will leave a message.  We will attempt to reach you two times.  During this call, we will ask if you have developed any symptoms of COVID 19. If you develop any symptoms (ie: fever, flu-like symptoms, shortness of breath, cough etc.) before then, please call [phone].  If you test positive for Covid 19 in the 2 weeks post procedure, please call and report this information to us.    If any biopsies were taken you will be contacted by phone or by letter within the next 1-3 weeks.  Please call us at [phone] if you have not heard about the biopsies in 3 weeks.    SIGNATURES/CONFIDENTIALITY: You and/or your care partner have signed paperwork which will be entered into your electronic medical record.  These signatures attest to the fact that that the information above on your After Visit Summary has been reviewed and is understood.  Full responsibility of the confidentiality of this discharge information lies with you and/or your care-partner. 

## 2019-05-17 NOTE — Progress Notes (Signed)
Pt's states no medical or surgical changes since previsit or office visit.  LC - temp DT - vitals 

## 2019-05-17 NOTE — Progress Notes (Signed)
Called to room to assist during endoscopic procedure.  Patient ID and intended procedure confirmed with present staff. Received instructions for my participation in the procedure from the performing physician.  

## 2019-05-17 NOTE — Progress Notes (Signed)
Report to PACU, RN, vss, BBS= Clear.  

## 2019-05-17 NOTE — Op Note (Signed)
[facility] Patient Name: Darren Best Procedure Date: 05/17/2019 9:38 AM MRN: [id number] Endoscopist: Remo Lipps P. Havery Moros , MD Age: 49 Referring MD:  Date of Birth: 09/09/1970 Gender: Male Account #: [id number] Procedure:                Colonoscopy Indications:              Screening for colorectal malignant neoplasm, This                            is the patient's first colonoscopy, also                            incidentally has some chronic loose stools Medicines:                Monitored Anesthesia Care Procedure:                Pre-Anesthesia Assessment:                           - Prior to the procedure, a History and Physical                            was performed, and patient medications and                            allergies were reviewed. The patient's tolerance of                            previous anesthesia was also reviewed. The risks                            and benefits of the procedure and the sedation                            options and risks were discussed with the patient.                            All questions were answered, and informed consent                            was obtained. Prior Anticoagulants: The patient has                            taken no previous anticoagulant or antiplatelet                            agents. ASA Grade Assessment: II - A patient with                            mild systemic disease. After reviewing the risks                            and benefits, the patient was deemed in  satisfactory condition to undergo the procedure.                           After obtaining informed consent, the colonoscope                            was passed under direct vision. Throughout the                            procedure, the patient's blood pressure, pulse, and                            oxygen saturations were monitored continuously. The                            Colonoscope was  introduced through the anus and                            advanced to the the terminal ileum, with                            identification of the appendiceal orifice and IC                            valve. The colonoscopy was performed without                            difficulty. The patient tolerated the procedure                            well. The quality of the bowel preparation was                            good. The terminal ileum, ileocecal valve,                            appendiceal orifice, and rectum were photographed. Scope In: 9:45:52 AM Scope Out: 10:02:05 AM Scope Withdrawal Time: 0 hours 14 minutes 52 seconds  Total Procedure Duration: 0 hours 16 minutes 13 seconds  Findings:                 The perianal and digital rectal examinations were                            normal.                           The terminal ileum appeared normal.                           A few small-mouthed diverticula were found in the                            sigmoid colon.  Two sessile polyps were found in the sigmoid colon.                            The polyps were 2 to 3 mm in size. These polyps                            were removed with a cold biopsy forceps. Resection                            and retrieval were complete.                           Internal hemorrhoids were found during                            retroflexion. The hemorrhoids were small.                           The exam was otherwise without abnormality. No                            inflammatory changes.                           Biopsies for histology were taken with a cold                            forceps from the right colon, left colon and                            transverse colon for evaluation of microscopic                            colitis. Complications:            No immediate complications. Estimated blood loss:                            Minimal. Estimated Blood  Loss:     Estimated blood loss was minimal. Impression:               - The examined portion of the ileum was normal.                           - Diverticulosis in the sigmoid colon.                           - Two 2 to 3 mm polyps in the sigmoid colon,                            removed with a cold biopsy forceps. Resected and                            retrieved.                           -  Internal hemorrhoids.                           - The examination was otherwise normal.                           - Biopsies were taken with a cold forceps from the                            right colon, left colon and transverse colon for                            evaluation of microscopic colitis given history of                            chronic loose stools. Recommendation:           - Patient has a contact number available for                            emergencies. The signs and symptoms of potential                            delayed complications were discussed with the                            patient. Return to normal activities tomorrow.                            Written discharge instructions were provided to the                            patient.                           - Resume previous diet.                           - Continue present medications.                           - Await pathology results. Remo Lipps P. Jamarien Rodkey, MD 05/17/2019 10:06:14 AM This report has been signed electronically.

## 2019-05-19 ENCOUNTER — Telehealth: Payer: Self-pay | Admitting: *Deleted

## 2019-05-19 NOTE — Telephone Encounter (Signed)
  Follow up Call-  Call back number 05/17/2019  Post procedure Call Back phone  # [phone]  Permission to leave phone message Yes  Some recent data might be hidden     Patient questions:  Do you have a fever, pain , or abdominal swelling? No. Pain Score  0 *  Have you tolerated food without any problems? Yes.    Have you been able to return to your normal activities? Yes.    Do you have any questions about your discharge instructions: Diet   No. Medications  No. Follow up visit  No.  Do you have questions or concerns about your Care? No.  Actions: * If pain score is 4 or above: No action needed, pain <4.  1. Have you developed a fever since your procedure? no  2.   Have you had an respiratory symptoms (SOB or cough) since your procedure? no  3.   Have you tested positive for COVID 19 since your procedure no  4.   Have you had any family members/close contacts diagnosed with the COVID 19 since your procedure?  no   If yes to any of these questions please route to Joylene John, RN and Alphonsa Gin, Therapist, sports.

## 2019-06-27 MED FILL — OMEPRAZOLE DR 20 MG CAPSULE: 20 | 90 days supply | Qty: 90 | Fill #2

## 2019-06-27 MED FILL — SERTRALINE HCL 50 MG TABLET: 50 | 90 days supply | Qty: 90 | Fill #0

## 2019-09-09 ENCOUNTER — Encounter: Payer: Self-pay | Admitting: Family Medicine

## 2019-10-06 ENCOUNTER — Other Ambulatory Visit: Payer: Self-pay | Admitting: Gastroenterology

## 2019-10-06 MED FILL — OMEPRAZOLE DR 20 MG CAPSULE: 20 | 90 days supply | Qty: 90 | Fill #0

## 2019-10-06 MED FILL — SERTRALINE HCL 50 MG TABLET: 50 | 90 days supply | Qty: 90 | Fill #1

## 2019-11-24 ENCOUNTER — Other Ambulatory Visit: Payer: Self-pay

## 2019-11-24 ENCOUNTER — Telehealth: Payer: Self-pay | Admitting: Family Medicine

## 2019-11-24 DIAGNOSIS — Z0000 Encounter for general adult medical examination without abnormal findings: Secondary | ICD-10-CM

## 2019-11-24 NOTE — Telephone Encounter (Signed)
Done

## 2019-11-24 NOTE — Telephone Encounter (Signed)
Please order fasting labs for physical scheduled for 10.27/2021

## 2019-11-28 ENCOUNTER — Encounter: Payer: 59 | Admitting: Family Medicine

## 2020-01-17 MED FILL — SERTRALINE HCL 50 MG TABLET: 50 | 90 days supply | Qty: 90 | Fill #2

## 2020-01-27 ENCOUNTER — Ambulatory Visit (INDEPENDENT_AMBULATORY_CARE_PROVIDER_SITE_OTHER): Payer: 59 | Admitting: Family Medicine

## 2020-01-27 ENCOUNTER — Other Ambulatory Visit: Payer: Self-pay

## 2020-01-27 DIAGNOSIS — Z0000 Encounter for general adult medical examination without abnormal findings: Secondary | ICD-10-CM

## 2020-01-27 LAB — COMPREHENSIVE METABOLIC PANEL
ALT: 15 IU/L (ref 0–44)
AST: 24 IU/L (ref 0–40)
Albumin/Globulin Ratio: 1.4 (ref 1.2–2.2)
Albumin: 4.3 g/dL (ref 4.0–5.0)
Alkaline Phosphatase: 64 IU/L (ref 44–121)
BUN/Creatinine Ratio: 12 (ref 9–20)
BUN: 11 mg/dL (ref 6–24)
Bilirubin Total: 0.3 mg/dL (ref 0.0–1.2)
CO2: 23 mmol/L (ref 20–29)
Calcium: 9.3 mg/dL (ref 8.7–10.2)
Chloride: 103 mmol/L (ref 96–106)
Creatinine, Ser: 0.94 mg/dL (ref 0.76–1.27)
GFR calc Af Amer: 109 mL/min/{1.73_m2} (ref >59)
GFR calc non Af Amer: 94 mL/min/{1.73_m2} (ref >59)
Globulin, Total: 3 g/dL (ref 1.5–4.5)
Glucose: 95 mg/dL (ref 65–99)
Potassium: 4.6 mmol/L (ref 3.5–5.2)
Sodium: 141 mmol/L (ref 134–144)
Total Protein: 7.3 g/dL (ref 6.0–8.5)

## 2020-01-27 LAB — LIPID PANEL
Chol/HDL Ratio: 3.2 ratio (ref 0.0–5.0)
Cholesterol, Total: 191 mg/dL (ref 100–199)
HDL: 60 mg/dL (ref >39)
LDL Chol Calc (NIH): 113 mg/dL — ABNORMAL HIGH (ref 0–99)
Triglycerides: 98 mg/dL (ref 0–149)
VLDL Cholesterol Cal: 18 mg/dL (ref 5–40)

## 2020-01-27 LAB — CBC WITH DIFFERENTIAL/PLATELET
Basophils Absolute: 0 10*3/uL (ref 0.0–0.2)
Basos: 1 %
EOS (ABSOLUTE): 0.1 10*3/uL (ref 0.0–0.4)
Eos: 6 %
Hematocrit: 42.2 % (ref 37.5–51.0)
Hemoglobin: 14.1 g/dL (ref 13.0–17.7)
Immature Grans (Abs): 0 10*3/uL (ref 0.0–0.1)
Immature Granulocytes: 0 %
Lymphocytes Absolute: 0.5 10*3/uL — ABNORMAL LOW (ref 0.7–3.1)
Lymphs: 25 %
MCH: 31.2 pg (ref 26.6–33.0)
MCHC: 33.4 g/dL (ref 31.5–35.7)
MCV: 93 fL (ref 79–97)
Monocytes Absolute: 0.3 10*3/uL (ref 0.1–0.9)
Monocytes: 13 %
Neutrophils Absolute: 1.2 10*3/uL — ABNORMAL LOW (ref 1.4–7.0)
Neutrophils: 55 %
Platelets: 174 10*3/uL (ref 150–450)
RBC: 4.52 x10E6/uL (ref 4.14–5.80)
RDW: 12.6 % (ref 11.6–15.4)
WBC: 2.2 10*3/uL — CL (ref 3.4–10.8)

## 2020-01-27 LAB — HEMOGLOBIN A1C
Est. average glucose Bld gHb Est-mCnc: 123 mg/dL
Hgb A1c MFr Bld: 5.9 % — ABNORMAL HIGH (ref 4.8–5.6)

## 2020-01-31 ENCOUNTER — Telehealth: Payer: Self-pay | Admitting: Family Medicine

## 2020-01-31 ENCOUNTER — Telehealth: Payer: Self-pay

## 2020-01-31 NOTE — Telephone Encounter (Signed)
Forwarded to [employer] [facility] urgently

## 2020-01-31 NOTE — Telephone Encounter (Signed)
Pt has WBC 2.2

## 2020-01-31 NOTE — Telephone Encounter (Signed)
[facility] called to report  critical results of WBC 2.2. Results read back correctly Called [facility] and informed Judson Roch.Khadim Lundberg correctly read back results- advised Judson Roch that  [facility] will be sending a fax as well.

## 2020-01-31 NOTE — Telephone Encounter (Signed)
Critical lab results from [place] at [facility] blood cell count of 2.2  Fax is being sent over as well. Please advise.

## 2020-02-01 ENCOUNTER — Other Ambulatory Visit: Payer: Self-pay

## 2020-02-01 ENCOUNTER — Encounter: Payer: Self-pay | Admitting: Family Medicine

## 2020-02-01 ENCOUNTER — Ambulatory Visit (INDEPENDENT_AMBULATORY_CARE_PROVIDER_SITE_OTHER): Payer: 59 | Admitting: Family Medicine

## 2020-02-01 DIAGNOSIS — R7989 Other specified abnormal findings of blood chemistry: Secondary | ICD-10-CM | POA: Diagnosis not present

## 2020-02-01 DIAGNOSIS — Z0001 Encounter for general adult medical examination with abnormal findings: Secondary | ICD-10-CM | POA: Diagnosis not present

## 2020-02-01 DIAGNOSIS — Z23 Encounter for immunization: Secondary | ICD-10-CM

## 2020-02-01 DIAGNOSIS — Z1159 Encounter for screening for other viral diseases: Secondary | ICD-10-CM | POA: Diagnosis not present

## 2020-02-01 DIAGNOSIS — Z125 Encounter for screening for malignant neoplasm of prostate: Secondary | ICD-10-CM | POA: Diagnosis not present

## 2020-02-01 DIAGNOSIS — Z0000 Encounter for general adult medical examination without abnormal findings: Secondary | ICD-10-CM

## 2020-02-01 NOTE — Patient Instructions (Addendum)
Try decreasing zoloft to 1/2 pill. In next few weeks - if doing well, let me know and we can talk about tapering off from there.   I will check blood count again today.   Blood sugar on labs ok, but A1c mildly elevated.  Continue exercise, watch diet and recheck levels in next 3-6 months.   Thanks for coming in today.   Keeping You Healthy  Get These Tests  Blood Pressure- Have your blood pressure checked by your healthcare provider at least once a year.  Normal blood pressure is 120/80.  Weight- Have your body mass index (BMI) calculated to screen for obesity.  BMI is a measure of body fat based on height and weight.  You can calculate your own BMI at [employer]  Cholesterol- Have your cholesterol checked every year.  Diabetes- Have your blood sugar checked every year if you have high blood pressure, high cholesterol, a family history of diabetes or if you are overweight.  Pap Test - Have a pap test every 1 to 5 years if you have been sexually active.  If you are older than 65 and recent pap tests have been normal you may not need additional pap tests.  In addition, if you have had a hysterectomy  for benign disease additional pap tests are not necessary.  Mammogram-Yearly mammograms are essential for early detection of breast cancer  Screening for Colon Cancer- Colonoscopy starting at age 57. Screening may begin sooner depending on your family history and other health conditions.  Follow up colonoscopy as directed by your Gastroenterologist.  Screening for Osteoporosis- Screening begins at age 35 with bone density scanning, sooner if you are at higher risk for developing Osteoporosis.  Get these medicines  Calcium with Vitamin D- Your body requires 1200-1500 mg of Calcium a day and [phone] IU of Vitamin D a day.  You can only absorb 500 mg of Calcium at a time therefore Calcium must be taken in 2 or 3 separate doses throughout the day. Hormones- Hormone therapy has  been associated with increased risk for certain cancers and heart disease.  Talk to your healthcare provider about if you need relief from menopausal symptoms.  Get these Immuniztions  Flu shot- Every fall  Pneumonia shot- Once after the age of 31; if you are younger ask your healthcare provider if you need a pneumonia shot.  Tetanus- Every ten years.  Zostavax- Once after the age of 70 to prevent shingles.  Take these steps  Don't smoke- Your healthcare provider can help you quit. For tips on how to quit, ask your healthcare provider or go to www.smokefree.gov or call 1-800 QUIT-NOW.  Be physically active- Exercise 5 days a week for a minimum of 30 minutes.  If you are not already physically active, start slow and gradually work up to 30 minutes of moderate physical activity.  Try walking, dancing, bike riding, swimming, etc.  Eat a healthy diet- Eat a variety of healthy foods such as fruits, vegetables, whole grains, low fat milk, low fat cheeses, yogurt, lean meats, chicken, fish, eggs, dried beans, tofu, etc.  For more information go to www.thenutritionsource.org  Dental visit- Brush and floss teeth twice daily; visit your dentist twice a year.  Eye exam- Visit your Optometrist or Ophthalmologist yearly.  Drink alcohol in moderation- Limit alcohol intake to one drink or less a day.  Never drink and drive.  Depression- Your emotional health is as important as your physical health.  If you're feeling down or  losing interest in things you normally enjoy, please talk to your healthcare provider.  Seat Belts- can save your life; always wear one  Smoke/Carbon Monoxide detectors- These detectors need to be installed on the appropriate level of your home.  Replace batteries at least once a year.  Violence- If anyone is threatening or hurting you, please tell your healthcare provider.  Living Will/ Health care power of attorney- Discuss with your healthcare provider and family.   If  you have lab work done today you will be contacted with your lab results within the next 2 weeks.  If you have not heard from [place] then please contact us. The fastest way to get your results is to register for My Chart.   IF you received an x-ray today, you will receive an invoice from [facility]. Please contact [facility] at [phone] with questions or concerns regarding your invoice.   IF you received labwork today, you will receive an invoice from [place]. Please contact [facility] at [phone] with questions or concerns regarding your invoice.   Our billing staff will not be able to assist you with questions regarding bills from these companies.  You will be contacted with the lab results as soon as they are available. The fastest way to get your results is to activate your My Chart account. Instructions are located on the last page of this paperwork. If you have not heard from [place] regarding the results in 2 weeks, please contact this office.

## 2020-02-01 NOTE — Progress Notes (Signed)
Subjective:  Patient ID: Darren Best, male    DOB: [birth year]  Age: 49 y.o. MRN: [id number]  CC:  Chief Complaint  Patient presents with  . Annual Exam    pt reports he feels well with no complaints.    HPI Darren Best presents for   Annual physical exam  Gastrointestinal: History of IBS, eosinophilic esophagitis, treated with omeprazole 20 mg daily.  Pepcid 20 mg as needed.  Levsin 0.125 mg as needed.   GI: Dr. Havery Moros.  Seasonal allergies - allegra as needed. Effective.   Depression: Discussed last year at physical, restart Zoloft at that time and counseling discussed.  Hydroxyzine has been provided as option for sleep, Zoloft was working well.  Melatonin.  Some family stressors discussed at that time.  Overall was doing well.  Mom passed few months ago. Doing ok - normal grieving process.  Occasional hydroxyzine if stressed. Melatonin working well.  Would like to stop zoloft.   Depression screen [facility] 2/9 02/01/2020 03/28/2019 01/03/2019 11/24/2018 03/11/2018  Decreased Interest 0 0 0 1 0  Down, Depressed, Hopeless 0 0 0 1 0  PHQ - 2 Score 0 0 0 2 0  Altered sleeping - - - 2 -  Tired, decreased energy - - - 1 -  Change in appetite - - - 0 -  Feeling bad or failure about yourself  - - - 1 -  Trouble concentrating - - - 1 -  Moving slowly or fidgety/restless - - - 0 -  Suicidal thoughts - - - 0 -  PHQ-9 Score - - - 7 -  Difficult doing work/chores - - - - -    Cancer screening: Colonoscopy 05/17/2019, repeat 7 years No fh of prostate CA.  No results found for: PSA1, PSA The natural history of prostate cancer and ongoing controversy regarding screening and potential treatment outcomes of prostate cancer has been discussed with the patient. The meaning of a false positive PSA and a false negative PSA has been discussed. He indicates understanding of the limitations of this screening test and wishes  to proceed with screening PSA testing only - no DRE.   Immunization  History  Administered Date(s) Administered  . Influenza, Seasonal, Injecte, Preservative Fre 01/01/2015, 01/18/2016  . PFIZER SARS-COV-2 Vaccination 04/22/2019, 05/12/2019, 01/25/2020  . Tdap 04/26/2012  shingrix: had shingles in 2004.  Covid vaccine January, February, booster October 20   Hearing Screening   125Hz  250Hz  500Hz  1000Hz  2000Hz  3000Hz  4000Hz  6000Hz  8000Hz   Right ear:           Left ear:             Visual Acuity Screening   Right eye Left eye Both eyes  Without correction: 20/20 20/20 20/20   With correction:     due for optho eval   Dental: q6 monhts.   Exercise: 2-3 days per week - running.about 123min/week.    Lab Results  Component Value Date   WBC 2.2 (LL) 01/27/2020   HGB 14.1 01/27/2020   HCT 42.2 01/27/2020   MCV 93 01/27/2020   PLT 174 01/27/2020  WBC 3.4 in 2019. No recent fever, illness, wt loss, night sweats.   HLD: No early CAD history Lab Results  Component Value Date   CHOL 191 01/27/2020   HDL 60 01/27/2020   LDLCALC 113 (H) 01/27/2020   TRIG 98 01/27/2020   CHOLHDL 3.2 01/27/2020  The 10-year ASCVD risk score [name] DC Jr., et al., 2013) is: 1.9%  Values used to calculate the score:     Age: 63 years     Sex: Male     Is Non-Hispanic African American: No     Diabetic: No     Tobacco smoker: No     Systolic Blood Pressure: 956 mmHg     Is BP treated: No     HDL Cholesterol: 60 mg/dL     Total Cholesterol: 191 mg/dL  Elevated A1c on recent labs.  Lab Results  Component Value Date   HGBA1C 5.9 (H) 01/27/2020     History Patient Active Problem List   Diagnosis Date Noted  . Pain of left hip joint 12/02/2016  . Eosinophilic esophagitis 21/30/8657  . IBS (irritable bowel syndrome) 05/15/2015  . Cyst, eyelid sebaceous 03/30/2013   Past Medical History:  Diagnosis Date  . Allergy   . Anxiety   . Depression   . Dysphagia   . Eosinophilic esophagitis   . GERD (gastroesophageal reflux disease)   . History of shingles   .  Irritable bowel syndrome    Past Surgical History:  Procedure Laterality Date  . lasix surgery    . UPPER GASTROINTESTINAL ENDOSCOPY     Allergies  Allergen Reactions  . Other Diarrhea and Itching  . Almond Oil Bitter Flavor [Flavoring Agent]   . Milk-Related Compounds    Prior to Admission medications   Medication Sig Start Date End Date Taking? Authorizing Provider  famotidine (PEPCID) 20 MG tablet Take 20 mg by mouth daily as needed for heartburn or indigestion.    Yes [provider]  fexofenadine (ALLEGRA) 180 MG tablet Take 180 mg by mouth daily.    Yes [provider]  hydrOXYzine (ATARAX/VISTARIL) 25 MG tablet TAKE 1/2 TO 1 TABLET BY MOUTH AT BEDTIME AS NEEDED FOR ANXIETY or sleep. 03/28/19  Yes [name], MD  hyoscyamine (LEVSIN, ANASPAZ) 0.125 MG tablet Take 1 tablet (0.125 mg total) by mouth every 4 (four) hours as needed for cramping. 07/20/18  Yes Armbruster, Carlota Raspberry, MD  ibuprofen (ADVIL,MOTRIN) 200 MG tablet Take 200 mg by mouth every 6 (six) hours as needed.   Yes [provider]  omeprazole (PRILOSEC) 20 MG capsule TAKE 1 CAPSULE (20 MG TOTAL) BY MOUTH DAILY. 10/06/19  Yes Armbruster, Carlota Raspberry, MD  ondansetron (ZOFRAN) 4 MG tablet Take 1 tablet (4 mg total) by mouth every 8 (eight) hours as needed for nausea or vomiting. 08/03/16  Yes [name], NP  sertraline (ZOLOFT) 50 MG tablet Take 1 tablet (50 mg total) by mouth daily. 03/28/19  Yes [name], MD   Social History   Socioeconomic History  . Marital status: Married    Spouse name: Not on file  . Number of children: 0  . Years of education: Not on file  . Highest education level: Not on file  Occupational History  . Occupation: Product manager: [employer]    Comment: IT/ [employer]  Tobacco Use  . Smoking status: Former Smoker    Types: Cigarettes  . Smokeless tobacco: Never Used  Vaping Use  . Vaping Use: Never used  Substance and Sexual Activity  .  Alcohol use: Yes    Comment: 2 drinks daily - 2 beers max  . Drug use: No  . Sexual activity: Yes    Comment: number of sex partners in the last 12 months 1  Other Topics Concern  . Not on file  Social History Narrative   College  degree   [employer]   2-3 caffeine drinks daily   Exercise cycling and gym classes 2-3 times per week for 1-2 hours    Social Determinants of Health   Financial Resource Strain:   . Difficulty of Paying Living Expenses: Not on file  Food Insecurity:   . Worried About Charity fundraiser in the Last Year: Not on file  . Ran Out of Food in the Last Year: Not on file  Transportation Needs:   . Lack of Transportation (Medical): Not on file  . Lack of Transportation (Non-Medical): Not on file  Physical Activity:   . Days of Exercise per Week: Not on file  . Minutes of Exercise per Session: Not on file  Stress:   . Feeling of Stress : Not on file  Social Connections:   . Frequency of Communication with Friends and Family: Not on file  . Frequency of Social Gatherings with Friends and Family: Not on file  . Attends Religious Services: Not on file  . Active Member of Clubs or Organizations: Not on file  . Attends Archivist Meetings: Not on file  . Marital Status: Not on file  Intimate Partner Violence:   . Fear of Current or Ex-Partner: Not on file  . Emotionally Abused: Not on file  . Physically Abused: Not on file  . Sexually Abused: Not on file    Review of Systems   Objective:  There were no vitals filed for this visit.   Physical Exam Vitals reviewed.  Constitutional:      Appearance: He is well-developed.  HENT:     Head: Normocephalic and atraumatic.     Right Ear: External ear normal.     Left Ear: External ear normal.  Eyes:     Conjunctiva/sclera: Conjunctivae normal.     Pupils: Pupils are equal, round, and reactive to light.  Neck:     Thyroid: No thyromegaly.  Cardiovascular:     Rate and Rhythm: Normal rate  and regular rhythm.     Heart sounds: Normal heart sounds.  Pulmonary:     Effort: Pulmonary effort is normal. No respiratory distress.     Breath sounds: Normal breath sounds. No wheezing.  Abdominal:     General: There is no distension.     Palpations: Abdomen is soft.     Tenderness: There is no abdominal tenderness.  Musculoskeletal:        General: No tenderness. Normal range of motion.     Cervical back: Normal range of motion and neck supple.  Lymphadenopathy:     Cervical: No cervical adenopathy.  Skin:    General: Skin is warm and dry.  Neurological:     Mental Status: He is alert and oriented to person, place, and time.     Deep Tendon Reflexes: Reflexes are normal and symmetric.  Psychiatric:        Behavior: Behavior normal.        Assessment & Plan:  Darren Best is a 49 y.o. male . Annual physical exam  - borderline elevated A1c - diet/exercise plan an repeat aic in 6 months.   - trial lower dose zoloft - then can taper if still doing well.   -anticipatory guidance as below in AVS, screening labs above. Health maintenance items as above in HPI discussed/recommended as applicable.   Need for hepatitis C screening test - Plan: Hepatitis C antibody  Need for shingles vaccine - Plan: Varicella-zoster vaccine subcutaneous  Screening for prostate cancer - Plan: PSA  - We discussed pros and cons of prostate cancer screening, and after this discussion, he chose to have screening done. PSA obtained, and no concerning findings on DRE.   Abnormal CBC - Plan: CBC  - asymptomatic. Repeat cbc, consider hematology eval.   No orders of the defined types were placed in this encounter.  Patient Instructions   Try decreasing zoloft to 1/2 pill. In next few weeks - if doing well, let me know and we can talk about tapering off from there.   I will check blood count again today.   Blood sugar on labs ok, but A1c mildly elevated.  Continue exercise, watch diet and recheck  levels in next 3-6 months.   Thanks for coming in today.   Keeping You Healthy  Get These Tests  Blood Pressure- Have your blood pressure checked by your healthcare provider at least once a year.  Normal blood pressure is 120/80.  Weight- Have your body mass index (BMI) calculated to screen for obesity.  BMI is a measure of body fat based on height and weight.  You can calculate your own BMI at [employer]  Cholesterol- Have your cholesterol checked every year.  Diabetes- Have your blood sugar checked every year if you have high blood pressure, high cholesterol, a family history of diabetes or if you are overweight.  Pap Test - Have a pap test every 1 to 5 years if you have been sexually active.  If you are older than 65 and recent pap tests have been normal you may not need additional pap tests.  In addition, if you have had a hysterectomy  for benign disease additional pap tests are not necessary.  Mammogram-Yearly mammograms are essential for early detection of breast cancer  Screening for Colon Cancer- Colonoscopy starting at age 104. Screening may begin sooner depending on your family history and other health conditions.  Follow up colonoscopy as directed by your Gastroenterologist.  Screening for Osteoporosis- Screening begins at age 81 with bone density scanning, sooner if you are at higher risk for developing Osteoporosis.  Get these medicines  Calcium with Vitamin D- Your body requires 1200-1500 mg of Calcium a day and [phone] IU of Vitamin D a day.  You can only absorb 500 mg of Calcium at a time therefore Calcium must be taken in 2 or 3 separate doses throughout the day. Hormones- Hormone therapy has been associated with increased risk for certain cancers and heart disease.  Talk to your healthcare provider about if you need relief from menopausal symptoms.  Get these Immuniztions  Flu shot- Every fall  Pneumonia shot- Once after the age of 46; if you are  younger ask your healthcare provider if you need a pneumonia shot.  Tetanus- Every ten years.  Zostavax- Once after the age of 21 to prevent shingles.  Take these steps  Don't smoke- Your healthcare provider can help you quit. For tips on how to quit, ask your healthcare provider or go to www.smokefree.gov or call 1-800 QUIT-NOW.  Be physically active- Exercise 5 days a week for a minimum of 30 minutes.  If you are not already physically active, start slow and gradually work up to 30 minutes of moderate physical activity.  Try walking, dancing, bike riding, swimming, etc.  Eat a healthy diet- Eat a variety of healthy foods such as fruits, vegetables, whole grains, low fat milk, low fat cheeses, yogurt, lean meats, chicken, fish, eggs, dried beans, tofu,  etc.  For more information go to www.thenutritionsource.org  Dental visit- Brush and floss teeth twice daily; visit your dentist twice a year.  Eye exam- Visit your Optometrist or Ophthalmologist yearly.  Drink alcohol in moderation- Limit alcohol intake to one drink or less a day.  Never drink and drive.  Depression- Your emotional health is as important as your physical health.  If you're feeling down or losing interest in things you normally enjoy, please talk to your healthcare provider.  Seat Belts- can save your life; always wear one  Smoke/Carbon Monoxide detectors- These detectors need to be installed on the appropriate level of your home.  Replace batteries at least once a year.  Violence- If anyone is threatening or hurting you, please tell your healthcare provider.  Living Will/ Health care power of attorney- Discuss with your healthcare provider and family.   If you have lab work done today you will be contacted with your lab results within the next 2 weeks.  If you have not heard from [place] then please contact us. The fastest way to get your results is to register for My Chart.   IF you received an x-ray today, you will  receive an invoice from [facility]. Please contact [facility] at [phone] with questions or concerns regarding your invoice.   IF you received labwork today, you will receive an invoice from [place]. Please contact [facility] at [phone] with questions or concerns regarding your invoice.   Our billing staff will not be able to assist you with questions regarding bills from these companies.  You will be contacted with the lab results as soon as they are available. The fastest way to get your results is to activate your My Chart account. Instructions are located on the last page of this paperwork. If you have not heard from [place] regarding the results in 2 weeks, please contact this office.         Signed, [name], MD [facility]

## 2020-02-01 NOTE — Telephone Encounter (Signed)
Noted, repeated today.

## 2020-02-02 LAB — CBC
Hematocrit: 40.5 % (ref 37.5–51.0)
Hemoglobin: 13.4 g/dL (ref 13.0–17.7)
MCH: 30.7 pg (ref 26.6–33.0)
MCHC: 33.1 g/dL (ref 31.5–35.7)
MCV: 93 fL (ref 79–97)
Platelets: 221 10*3/uL (ref 150–450)
RBC: 4.37 x10E6/uL (ref 4.14–5.80)
RDW: 12.6 % (ref 11.6–15.4)
WBC: 4.6 10*3/uL (ref 3.4–10.8)

## 2020-02-02 LAB — PSA: Prostate Specific Ag, Serum: 0.5 ng/mL (ref 0.0–4.0)

## 2020-02-02 LAB — HEPATITIS C ANTIBODY: Hep C Virus Ab: 0.4 s/co ratio (ref 0.0–0.9)

## 2020-03-21 ENCOUNTER — Encounter: Payer: Self-pay | Admitting: Gastroenterology

## 2020-03-21 ENCOUNTER — Other Ambulatory Visit: Payer: Self-pay | Admitting: Gastroenterology

## 2020-03-21 ENCOUNTER — Ambulatory Visit (INDEPENDENT_AMBULATORY_CARE_PROVIDER_SITE_OTHER): Payer: 59 | Admitting: Gastroenterology

## 2020-03-21 VITALS — BP 104/70 | HR 50 | Ht 71.0 in | Wt 186.0 lb

## 2020-03-21 DIAGNOSIS — K219 Gastro-esophageal reflux disease without esophagitis: Secondary | ICD-10-CM

## 2020-03-21 DIAGNOSIS — K200 Eosinophilic esophagitis: Secondary | ICD-10-CM | POA: Diagnosis not present

## 2020-03-21 DIAGNOSIS — K580 Irritable bowel syndrome with diarrhea: Secondary | ICD-10-CM

## 2020-03-21 MED ORDER — LOPERAMIDE HCL 2 MG PO CAPS: 2.0000 mg | ORAL_CAPSULE | ORAL | 0 refills | Status: DC | PRN

## 2020-03-21 MED ORDER — OMEPRAZOLE 20 MG PO CPDR
20.0000 mg | DELAYED_RELEASE_CAPSULE | Freq: Every day | ORAL | 3 refills | Status: DC
Start: 2020-03-21 — End: 2020-03-21

## 2020-03-21 MED FILL — OMEPRAZOLE DR 20 MG CAPSULE: 20 | 90 days supply | Qty: 90 | Fill #0

## 2020-03-21 NOTE — Progress Notes (Signed)
HPI :  49 year old male here for a follow-up visit for history of EOE, GERD, history of polyps. Please see prior notes regarding full history of this case. He has a history of eosinophilic esophagitis with history of food impactions.He has had prior allergy testing which has shown allergies to milk, tomatoes, squash, and almonds.He has previously been on oral Flovent as well as PPIs in the past.  Since her last visit he is generally been doing okay without much symptoms bothering him.  He really tries to restrict his dairy use as he thinks there is definitely a correlation between that and some of the symptoms that bother him.  He takes omeprazole 20 mg a day and states that controls his symptoms generally pretty well.  He does not have any reflux that bothers him when he takes it.  He did have a few days where he ran out of omeprazole and some of the symptoms started to recur but he was using Prilosec OTC in the interim.  He denies any dysphagia.  He denies any chest pains.  His last EGD was in 2017 as outlined below.  He does have suspected IBS-D.  He has loose stools in the morning but otherwise tends to do okay.  He has rare cramping which he uses Levsin for which helps.  He is tested negative for celiac disease with serologies in the past.  We performed a colonoscopy for him for screening purposes this February.  He had 1 small adenoma removed.  His colon was normal but random biopsies were taken to ensure no evidence of microscopic colitis and that was negative.  We had discussed using Imodium as needed at the last visit but he has not really use that much.  Previously we had given a sample of Viberzi but he did not like the way that made him feel and he stopped it.  He has no family history of colon cancer.  Prior evaluation: EGD 01/2011 - distal esophageal stricture, dilated with 104mm Savory, increased Eos to 29 / HPF EGD 06/2015 -subtle trachealization and furrowing of the esophagus,  without stenosis, with the remainder of the exam being normal. Biopsies show increased eosinophils of the esophagus  Colonoscopy 05/17/19 - The perianal and digital rectal examinations were normal. - The terminal ileum appeared normal. - A few small-mouthed diverticula were found in the sigmoid colon. - Two sessile polyps were found in the sigmoid colon. The polyps were 2 to 3 mm in size. These polyps were removed with a cold biopsy forceps. Resection and retrieval were complete. - Internal hemorrhoids were found during retroflexion. The hemorrhoids were small. - The exam was otherwise without abnormality. No inflammatory changes. - Biopsies for histology were taken with a cold forceps from the right colon, left colon and transverse colon for evaluation of microscopic colitis.  1. Surgical [P], transverse and right and left colon - COLONIC MUCOSA WITH NO SIGNIFICANT PATHOLOGIC FINDINGS. - NEGATIVE FOR ACTIVE INFLAMMATION AND OTHER ABNORMALITIES. 2. Surgical [P], colon, sigmoid, polyp (2) - TUBULAR ADENOMA, NEGATIVE FOR HIGH GRADE DYSPLASIA. - HYPERPLASTIC POLYP.  Repeat colonoscopy in 7 years    Past Medical History:  Diagnosis Date   Allergy    Anxiety    Depression    Dysphagia    Eosinophilic esophagitis    GERD (gastroesophageal reflux disease)    History of shingles    Irritable bowel syndrome      Past Surgical History:  Procedure Laterality Date   lasix surgery  UPPER GASTROINTESTINAL ENDOSCOPY     Family History  Problem Relation Age of Onset   Depression Father    Heart disease Father        some coronary issues   Aneurysm Father        kidney   Mental illness Father    Stroke Father    Colon cancer Paternal Grandmother        per pt colon vs stomach    Diabetes Mother        adult onset   Diabetes Brother    Colon polyps Neg Hx    Esophageal cancer Neg Hx    Rectal cancer Neg Hx    Stomach cancer Neg Hx    Social History    Tobacco Use   Smoking status: Former Smoker    Types: Cigarettes   Smokeless tobacco: Never Used  Scientific laboratory technician Use: Never used  Substance Use Topics   Alcohol use: Yes    Comment: 2 drinks daily - 2 beers max   Drug use: No   Current Outpatient Medications  Medication Sig Dispense Refill   famotidine (PEPCID) 20 MG tablet Take 20 mg by mouth daily as needed for heartburn or indigestion.      fexofenadine (ALLEGRA) 180 MG tablet Take 180 mg by mouth daily.      hydrOXYzine (ATARAX/VISTARIL) 25 MG tablet TAKE 1/2 TO 1 TABLET BY MOUTH AT BEDTIME AS NEEDED FOR ANXIETY or sleep. 90 tablet 0   hyoscyamine (LEVSIN, ANASPAZ) 0.125 MG tablet Take 1 tablet (0.125 mg total) by mouth every 4 (four) hours as needed for cramping. 90 tablet 3   ibuprofen (ADVIL,MOTRIN) 200 MG tablet Take 200 mg by mouth every 6 (six) hours as needed.     omeprazole (PRILOSEC) 20 MG capsule TAKE 1 CAPSULE (20 MG TOTAL) BY MOUTH DAILY. 90 capsule 0   ondansetron (ZOFRAN) 4 MG tablet Take 1 tablet (4 mg total) by mouth every 8 (eight) hours as needed for nausea or vomiting. 12 tablet 0   sertraline (ZOLOFT) 50 MG tablet Take 1 tablet (50 mg total) by mouth daily. 90 tablet 2   No current facility-administered medications for this visit.   Allergies  Allergen Reactions   Other Diarrhea and Itching   Almond Oil Bitter Flavor [Flavoring Agent]    Milk-Related Compounds      Review of Systems: All systems reviewed and negative except where noted in HPI.   Lab Results  Component Value Date   WBC 4.6 02/01/2020   HGB 13.4 02/01/2020   HCT 40.5 02/01/2020   MCV 93 02/01/2020   PLT 221 02/01/2020    Lab Results  Component Value Date   CREATININE 0.94 01/27/2020   BUN 11 01/27/2020   NA 141 01/27/2020   K 4.6 01/27/2020   CL 103 01/27/2020   CO2 23 01/27/2020    Lab Results  Component Value Date   ALT 15 01/27/2020   AST 24 01/27/2020   ALKPHOS 64 01/27/2020   BILITOT 0.3  01/27/2020     Physical Exam: BP 104/70    Pulse (!) 50    Ht 5\' 11"  (1.803 m)    Wt 186 lb (84.4 kg)    BMI 25.94 kg/m  Constitutional: Pleasant,well-developed, male in no acute distress. Neurological: Alert and oriented to person place and time. Psychiatric: Normal mood and affect. Behavior is normal.   ASSESSMENT AND PLAN: 49 year old male here for reassessment the following:  EoE / GERD -  has cleared food allergens as outlined above, has been avoiding milk/dairy and doing pretty well with this.  He has not had any dysphagia recently.  On omeprazole 20 mg a day controls his reflux and his symptoms and generally does pretty well.  He ran out of this recently, will refill omeprazole 20 mg a day.  We discussed long-term risk benefits of chronic PPI use, feel benefits strongly outweigh the risks in his case.  We have not had to use topical fluticasone or other treatments for EOE recently.  He can follow-up with me once yearly for this issue.  If he notices dysphagia or problems swallowing moving forward he should contact me in the interim.  He agreed.  He will continue to avoid known food allergens if possible.  IBS-D - colonoscopy shows no evidence of microscopic colitis, tested negative for celiac disease.  IBS is the likely diagnosis.  He did not tolerate Viberzi in the past.  Most of his symptoms are in the morning with some loose stools.  Recommended a trial of Imodium nightly to see if that might help slow him down.  He can titrate this up or down as needed.  He wishes to try that and will contact me if it does not help or he does not like the way it makes him feel, we can consider alternatives.  He agreed  Garza Cellar, MD [facility]

## 2020-03-21 NOTE — Patient Instructions (Signed)
If you are age 51 or older, your body mass index should be between 23-30. Your Body mass index is 25.94 kg/m. If this is out of the aforementioned range listed, please consider follow up with your Primary Care Provider.  If you are age 29 or younger, your body mass index should be between 19-25. Your Body mass index is 25.94 kg/m. If this is out of the aformentioned range listed, please consider follow up with your Primary Care Provider.   We have sent the following medications to your pharmacy for you to pick up at your convenience: omeprazole 20 mg once daily  Take Imodium, over the counter as needed.  Please follow up in 1 year.  Thank you for entrusting me with your care and for choosing [facility], Dr. Elgin Cellar

## 2020-04-06 DIAGNOSIS — Z20822 Contact with and (suspected) exposure to covid-19: Secondary | ICD-10-CM | POA: Diagnosis not present

## 2020-05-14 ENCOUNTER — Other Ambulatory Visit (HOSPITAL_COMMUNITY): Payer: Self-pay | Admitting: Ophthalmology

## 2020-05-14 DIAGNOSIS — H04123 Dry eye syndrome of bilateral lacrimal glands: Secondary | ICD-10-CM | POA: Diagnosis not present

## 2020-06-07 ENCOUNTER — Other Ambulatory Visit: Payer: Self-pay | Admitting: Oncology

## 2020-06-07 DIAGNOSIS — Z0000 Encounter for general adult medical examination without abnormal findings: Secondary | ICD-10-CM

## 2020-06-12 ENCOUNTER — Encounter: Payer: Self-pay | Admitting: Family Medicine

## 2020-07-09 ENCOUNTER — Other Ambulatory Visit (HOSPITAL_COMMUNITY): Payer: Self-pay

## 2020-07-09 MED FILL — Omeprazole Cap Delayed Release 20 MG: ORAL | 90 days supply | Qty: 90 | Fill #0 | Status: AC

## 2020-08-01 ENCOUNTER — Ambulatory Visit: Payer: Self-pay | Admitting: Family Medicine

## 2020-08-13 ENCOUNTER — Other Ambulatory Visit: Payer: Self-pay

## 2020-08-13 ENCOUNTER — Ambulatory Visit: Payer: 59 | Admitting: Internal Medicine

## 2020-08-13 ENCOUNTER — Encounter: Payer: Self-pay | Admitting: Internal Medicine

## 2020-08-13 VITALS — BP 106/66 | HR 62 | Temp 98.4°F | Ht 71.0 in | Wt 193.0 lb

## 2020-08-13 DIAGNOSIS — K580 Irritable bowel syndrome with diarrhea: Secondary | ICD-10-CM

## 2020-08-13 DIAGNOSIS — K200 Eosinophilic esophagitis: Secondary | ICD-10-CM | POA: Diagnosis not present

## 2020-08-13 NOTE — Progress Notes (Signed)
Subjective:  Patient ID: Darren Best, male    DOB: 1971/02/19  Age: 50 y.o. MRN: [id number]  CC: Gastroesophageal Reflux  This visit occurred during the SARS-CoV-2 public health emergency.  Safety protocols were in place, including screening questions prior to the visit, additional usage of staff PPE, and extensive cleaning of exam room while observing appropriate contact time as indicated for disinfecting solutions.    HPI Darren Best presents for f/up -  He has felt well recently and offers no complaints.  He has a history of GERD and IBS.  He simply came in today to establish with a new PCP.  He is a runner and does not experience CP, DOE, fatigue, diaphoresis, or edema.  History Darren Best has a past medical history of Allergy, Anxiety, Depression, Dysphagia, Eosinophilic esophagitis, GERD (gastroesophageal reflux disease), History of shingles, and Irritable bowel syndrome.   He has a past surgical history that includes lasix surgery and Upper gastrointestinal endoscopy.   His family history includes Aneurysm in his father; Cancer in his brother; Colon cancer in his paternal grandmother; Depression in his father and mother; Diabetes in his brother and mother; Drug abuse in his brother and brother; Early death in his brother; Healthy in his sister; Heart attack in his father and sister; Heart disease in his father; Hypertension in his brother; Mental illness in his father; Stroke in his father and sister.He reports that he has quit smoking. His smoking use included cigarettes. He has never used smokeless tobacco. He reports current alcohol use of about 2.0 standard drinks of alcohol per week. He reports that he does not use drugs.  Outpatient Medications Prior to Visit  Medication Sig Dispense Refill  . famotidine (PEPCID) 20 MG tablet Take 20 mg by mouth daily as needed for heartburn or indigestion.     . fexofenadine (ALLEGRA) 180 MG tablet Take 180 mg by mouth daily.     . hydrOXYzine  (ATARAX/VISTARIL) 25 MG tablet TAKE 1/2 TO 1 TABLET BY MOUTH AT BEDTIME AS NEEDED FOR ANXIETY or sleep. 90 tablet 0  . hyoscyamine (LEVSIN, ANASPAZ) 0.125 MG tablet Take 1 tablet (0.125 mg total) by mouth every 4 (four) hours as needed for cramping. 90 tablet 3  . ibuprofen (ADVIL,MOTRIN) 200 MG tablet Take 200 mg by mouth every 6 (six) hours as needed.    . loperamide (IMODIUM A-D) 2 MG capsule Take 1 capsule (2 mg total) by mouth as needed for diarrhea or loose stools. 30 capsule 0  . omeprazole (PRILOSEC) 20 MG capsule TAKE 1 CAPSULE (20 MG TOTAL) BY MOUTH DAILY. 90 capsule 3  . ondansetron (ZOFRAN) 4 MG tablet Take 1 tablet (4 mg total) by mouth every 8 (eight) hours as needed for nausea or vomiting. 12 tablet 0  . Pilocarpine HCl 1.25 % SOLN INSTILL 1 DROP INTO BOTH EYES ONCE A DAY 2.5 mL 3  . sertraline (ZOLOFT) 50 MG tablet Take 1 tablet (50 mg total) by mouth daily. 90 tablet 2   No facility-administered medications prior to visit.    ROS Review of Systems  Constitutional: Negative for appetite change, chills, diaphoresis, fatigue and fever.  HENT: Negative.   Eyes: Negative.   Respiratory: Negative.  Negative for cough, chest tightness, shortness of breath and wheezing.   Cardiovascular: Negative for chest pain, palpitations and leg swelling.  Gastrointestinal: Negative for abdominal pain, constipation, diarrhea, nausea and vomiting.  Genitourinary: Negative.  Negative for difficulty urinating.  Musculoskeletal: Negative.   Skin: Negative.  Neurological: Negative for dizziness.  Hematological: Negative for adenopathy. Does not bruise/bleed easily.  Psychiatric/Behavioral: Negative.     Objective:  BP 106/66 (BP Location: Left Arm, Patient Position: Sitting, Cuff Size: Large)   Pulse 62   Temp 98.4 F (36.9 C) (Oral)   Ht 5\' 11"  (1.803 m)   Wt 193 lb (87.5 kg)   SpO2 97%   BMI 26.92 kg/m   Physical Exam Vitals reviewed.  Constitutional:      Appearance: Normal  appearance.  HENT:     Nose: Nose normal.     Mouth/Throat:     Mouth: Mucous membranes are moist.  Eyes:     General: No scleral icterus.    Pupils: Pupils are equal, round, and reactive to light.  Cardiovascular:     Rate and Rhythm: Normal rate and regular rhythm.     Heart sounds: No murmur heard.   Pulmonary:     Effort: Pulmonary effort is normal.     Breath sounds: No stridor. No wheezing, rhonchi or rales.  Abdominal:     General: Abdomen is flat.     Palpations: There is no mass.     Tenderness: There is no abdominal tenderness. There is no guarding.  Skin:    General: Skin is warm and dry.  Neurological:     General: No focal deficit present.     Mental Status: He is alert.  Psychiatric:        Mood and Affect: Mood normal.        Behavior: Behavior normal.     Lab Results  Component Value Date   WBC 4.6 02/01/2020   HGB 13.4 02/01/2020   HCT 40.5 02/01/2020   PLT 221 02/01/2020   GLUCOSE 95 01/27/2020   CHOL 191 01/27/2020   TRIG 98 01/27/2020   HDL 60 01/27/2020   LDLCALC 113 (H) 01/27/2020   ALT 15 01/27/2020   AST 24 01/27/2020   NA 141 01/27/2020   K 4.6 01/27/2020   CL 103 01/27/2020   CREATININE 0.94 01/27/2020   BUN 11 01/27/2020   CO2 23 01/27/2020   TSH 2.070 11/24/2018   HGBA1C 5.9 (H) 01/27/2020    Assessment & Plan:   Omega was seen today for gastroesophageal reflux.  Diagnoses and all orders for this visit:  Eosinophilic esophagitis- His symptoms are well controlled.  Irritable bowel syndrome with diarrhea- His symptoms are well controlled with the listed meds.   I have discontinued [name]'s sertraline. I am also having him maintain his famotidine, ondansetron, ibuprofen, fexofenadine, hyoscyamine, hydrOXYzine, loperamide, Pilocarpine HCl, and omeprazole.  No orders of the defined types were placed in this encounter.    Follow-up: No follow-ups on file.  Scarlette Calico, MD

## 2020-08-14 ENCOUNTER — Encounter: Payer: Self-pay | Admitting: Internal Medicine

## 2020-10-30 ENCOUNTER — Other Ambulatory Visit (HOSPITAL_COMMUNITY): Payer: Self-pay

## 2020-10-30 MED FILL — Omeprazole Cap Delayed Release 20 MG: ORAL | 90 days supply | Qty: 90 | Fill #1 | Status: AC

## 2021-01-28 ENCOUNTER — Other Ambulatory Visit: Payer: Self-pay

## 2021-01-28 ENCOUNTER — Ambulatory Visit (INDEPENDENT_AMBULATORY_CARE_PROVIDER_SITE_OTHER): Payer: 59 | Admitting: Internal Medicine

## 2021-01-28 ENCOUNTER — Encounter: Payer: Self-pay | Admitting: Internal Medicine

## 2021-01-28 VITALS — BP 126/74 | HR 65 | Temp 98.2°F | Resp 16 | Ht 71.0 in | Wt 185.0 lb

## 2021-01-28 DIAGNOSIS — Z23 Encounter for immunization: Secondary | ICD-10-CM

## 2021-01-28 DIAGNOSIS — R7303 Prediabetes: Secondary | ICD-10-CM | POA: Diagnosis not present

## 2021-01-28 DIAGNOSIS — Z0000 Encounter for general adult medical examination without abnormal findings: Secondary | ICD-10-CM

## 2021-01-28 DIAGNOSIS — K200 Eosinophilic esophagitis: Secondary | ICD-10-CM | POA: Diagnosis not present

## 2021-01-28 LAB — LIPID PANEL
Cholesterol: 215 mg/dL — ABNORMAL HIGH (ref 0–200)
HDL: 71.1 mg/dL (ref >39.00)
LDL Cholesterol: 131 mg/dL — ABNORMAL HIGH (ref 0–99)
NonHDL: 143.76
Total CHOL/HDL Ratio: 3
Triglycerides: 66 mg/dL (ref 0.0–149.0)
VLDL: 13.2 mg/dL (ref 0.0–40.0)

## 2021-01-28 LAB — CBC WITH DIFFERENTIAL/PLATELET
Basophils Absolute: 0 10*3/uL (ref 0.0–0.1)
Basophils Relative: 1.2 % (ref 0.0–3.0)
Eosinophils Absolute: 0.3 10*3/uL (ref 0.0–0.7)
Eosinophils Relative: 8.8 % — ABNORMAL HIGH (ref 0.0–5.0)
HCT: 42 % (ref 39.0–52.0)
Hemoglobin: 14.2 g/dL (ref 13.0–17.0)
Lymphocytes Relative: 35.5 % (ref 12.0–46.0)
Lymphs Abs: 1.3 10*3/uL (ref 0.7–4.0)
MCHC: 33.7 g/dL (ref 30.0–36.0)
MCV: 93 fl (ref 78.0–100.0)
Monocytes Absolute: 0.3 10*3/uL (ref 0.1–1.0)
Monocytes Relative: 7.1 % (ref 3.0–12.0)
Neutro Abs: 1.8 10*3/uL (ref 1.4–7.7)
Neutrophils Relative %: 47.4 % (ref 43.0–77.0)
Platelets: 238 10*3/uL (ref 150.0–400.0)
RBC: 4.52 Mil/uL (ref 4.22–5.81)
RDW: 13 % (ref 11.5–15.5)
WBC: 3.8 10*3/uL — ABNORMAL LOW (ref 4.0–10.5)

## 2021-01-28 LAB — BASIC METABOLIC PANEL
BUN: 11 mg/dL (ref 6–23)
CO2: 28 mEq/L (ref 19–32)
Calcium: 9.6 mg/dL (ref 8.4–10.5)
Chloride: 102 mEq/L (ref 96–112)
Creatinine, Ser: 0.94 mg/dL (ref 0.40–1.50)
GFR: 93.65 mL/min (ref >60.00)
Glucose, Bld: 102 mg/dL — ABNORMAL HIGH (ref 70–99)
Potassium: 4.4 mEq/L (ref 3.5–5.1)
Sodium: 141 mEq/L (ref 135–145)

## 2021-01-28 LAB — HEMOGLOBIN A1C: Hgb A1c MFr Bld: 5.9 % (ref 4.6–6.5)

## 2021-01-28 LAB — PSA: PSA: 0.45 ng/mL (ref 0.10–4.00)

## 2021-01-28 NOTE — Progress Notes (Signed)
Subjective:  Patient ID: Darren Best, male    DOB: [birth year]  Age: 50 y.o. MRN: [id number]  CC: Annual Exam and Gastroesophageal Reflux  This visit occurred during the SARS-CoV-2 public health emergency.  Safety protocols were in place, including screening questions prior to the visit, additional usage of staff PPE, and extensive cleaning of exam room while observing appropriate contact time as indicated for disinfecting solutions.    HPI Darren Best presents for a CPX and f/up -   GERD sx's are well controlled. He offers no complaints today.  Outpatient Medications Prior to Visit  Medication Sig Dispense Refill   famotidine (PEPCID) 20 MG tablet Take 20 mg by mouth daily as needed for heartburn or indigestion.      fexofenadine (ALLEGRA) 180 MG tablet Take 180 mg by mouth daily.      hydrOXYzine (ATARAX/VISTARIL) 25 MG tablet TAKE 1/2 TO 1 TABLET BY MOUTH AT BEDTIME AS NEEDED FOR ANXIETY or sleep. 90 tablet 0   hyoscyamine (LEVSIN, ANASPAZ) 0.125 MG tablet Take 1 tablet (0.125 mg total) by mouth every 4 (four) hours as needed for cramping. 90 tablet 3   ibuprofen (ADVIL,MOTRIN) 200 MG tablet Take 200 mg by mouth every 6 (six) hours as needed.     loperamide (IMODIUM A-D) 2 MG capsule Take 1 capsule (2 mg total) by mouth as needed for diarrhea or loose stools. 30 capsule 0   omeprazole (PRILOSEC) 20 MG capsule TAKE 1 CAPSULE (20 MG TOTAL) BY MOUTH DAILY. 90 capsule 3   ondansetron (ZOFRAN) 4 MG tablet Take 1 tablet (4 mg total) by mouth every 8 (eight) hours as needed for nausea or vomiting. 12 tablet 0   Pilocarpine HCl 1.25 % SOLN INSTILL 1 DROP INTO BOTH EYES ONCE A DAY 2.5 mL 3   No facility-administered medications prior to visit.    ROS Review of Systems  Constitutional:  Negative for diaphoresis, fatigue and unexpected weight change.  HENT:  Negative for sore throat and trouble swallowing.   Eyes: Negative.   Respiratory:  Negative for cough, chest tightness, shortness  of breath and wheezing.   Cardiovascular:  Negative for chest pain, palpitations and leg swelling.  Gastrointestinal:  Negative for abdominal pain, constipation, diarrhea, nausea and vomiting.  Endocrine: Negative.   Genitourinary: Negative.  Negative for difficulty urinating, penile swelling, scrotal swelling and testicular pain.  Musculoskeletal: Negative.   Skin: Negative.  Negative for color change and pallor.  Neurological: Negative.  Negative for dizziness, weakness, light-headedness and numbness.  Hematological:  Negative for adenopathy. Does not bruise/bleed easily.  Psychiatric/Behavioral: Negative.     Objective:  BP 126/74 (BP Location: Left Arm, Patient Position: Sitting, Cuff Size: Large)   Pulse 65   Temp 98.2 F (36.8 C) (Oral)   Resp 16   Ht 5\' 11"  (1.803 m)   Wt 185 lb (83.9 kg)   SpO2 96%   BMI 25.80 kg/m   BP Readings from Last 3 Encounters:  01/28/21 126/74  08/13/20 106/66  03/21/20 104/70    Wt Readings from Last 3 Encounters:  01/28/21 185 lb (83.9 kg)  08/13/20 193 lb (87.5 kg)  03/21/20 186 lb (84.4 kg)    Physical Exam Vitals reviewed.  Constitutional:      Appearance: Normal appearance.  HENT:     Mouth/Throat:     Mouth: Mucous membranes are moist.  Eyes:     General: No scleral icterus.    Conjunctiva/sclera: Conjunctivae normal.  Cardiovascular:  Rate and Rhythm: Normal rate and regular rhythm.     Heart sounds: No murmur heard. Pulmonary:     Effort: Pulmonary effort is normal.     Breath sounds: No stridor. No wheezing, rhonchi or rales.  Abdominal:     General: Abdomen is flat.     Palpations: There is no mass.     Tenderness: There is no abdominal tenderness. There is no guarding or rebound.     Hernia: No hernia is present. There is no hernia in the left inguinal area or right inguinal area.  Genitourinary:    Pubic Area: No rash.      Penis: Normal and circumcised.      Testes: Normal.     Epididymis:     Right:  Normal.     Left: Normal.     Prostate: Normal. Not enlarged, not tender and no nodules present.     Rectum: Normal. Guaiac result negative. No mass, tenderness, anal fissure, external hemorrhoid or internal hemorrhoid. Normal anal tone.  Musculoskeletal:        General: Normal range of motion.     Cervical back: Neck supple.     Right lower leg: No edema.     Left lower leg: No edema.  Lymphadenopathy:     Cervical: No cervical adenopathy.     Lower Body: No right inguinal adenopathy. No left inguinal adenopathy.  Skin:    General: Skin is warm and dry.     Coloration: Skin is not pale.  Neurological:     General: No focal deficit present.     Mental Status: He is alert.  Psychiatric:        Mood and Affect: Mood normal.        Behavior: Behavior normal.    Lab Results  Component Value Date   WBC 3.8 (L) 01/28/2021   HGB 14.2 01/28/2021   HCT 42.0 01/28/2021   PLT 238.0 01/28/2021   GLUCOSE 102 (H) 01/28/2021   CHOL 215 (H) 01/28/2021   TRIG 66.0 01/28/2021   HDL 71.10 01/28/2021   LDLCALC 131 (H) 01/28/2021   ALT 15 01/27/2020   AST 24 01/27/2020   NA 141 01/28/2021   K 4.4 01/28/2021   CL 102 01/28/2021   CREATININE 0.94 01/28/2021   BUN 11 01/28/2021   CO2 28 01/28/2021   TSH 2.070 11/24/2018   PSA 0.45 01/28/2021   HGBA1C 5.9 01/28/2021    MR TIBIA FIBULA RIGHT WO CONTRAST  Result Date: 03/27/2019 CLINICAL DATA:  Anterior right lower leg pain with running for 3 months. Suspected stress fracture. EXAM: MRI OF LOWER RIGHT EXTREMITY WITHOUT CONTRAST TECHNIQUE: Multiplanar, multisequence MR imaging of the right lower leg was performed. No intravenous contrast was administered. COMPARISON:  Radiographs 01/11/2019. FINDINGS: Bones/Joint/Cartilage Both lower legs are included on the coronal images. There is no evidence of acute fracture or dislocation. There is no asymmetric cortical thickening or marrow edema in the right tibia or fibula. No significant effusions or  arthropathic changes are seen at either knee or ankle. Ligaments Not relevant for exam/indication. Muscles and Tendons The muscles and visualized tendons of the right lower leg appear unremarkable. Soft tissues Mild asymmetric pretibial subcutaneous edema on the right without focal fluid collection. The soft tissues of the right lower leg otherwise appear unremarkable. No significant vascular findings on noncontrast imaging. IMPRESSION: 1. Mild asymmetric pretibial subcutaneous edema on the right may represent mild/early medial tibial stress syndrome (shin splints). 2. No evidence of stress  fracture or other significant findings. Electronically Signed   By: Richardean Sale M.D.   On: 03/27/2019 13:59    Assessment & Plan:   Antavious was seen today for annual exam and gastroesophageal reflux.  Diagnoses and all orders for this visit:  Routine general medical examination at a health care facility- Exam completed, labs reviewed, vaccines reviewed and updated, cancer screenings are up-to-date, patient education was given. -     PSA; Future -     Lipid panel; Future -     Lipid panel -     PSA  Eosinophilic esophagitis- His symptoms are well controlled.  Will continue the PPI. -     CBC with Differential/Platelet; Future -     CBC with Differential/Platelet  Prediabetes- His A1c is at 5.9%.  Medical therapy is not indicated. -     Basic metabolic panel; Future -     Hemoglobin A1c; Future -     Hemoglobin A1c -     Basic metabolic panel  Other orders -     Varicella-zoster vaccine IM (Shingrix)  I am having [name] maintain his famotidine, ondansetron, ibuprofen, fexofenadine, hyoscyamine, hydrOXYzine, loperamide, Pilocarpine HCl, and omeprazole.  No orders of the defined types were placed in this encounter.    Follow-up: Return in about 3 months (around 04/30/2021).  Scarlette Calico, MD

## 2021-01-28 NOTE — Patient Instructions (Signed)
Health Maintenance, Male Adopting a healthy lifestyle and getting preventive care are important in promoting health and wellness. Ask your health care provider about: The right schedule for you to have regular tests and exams. Things you can do on your own to prevent diseases and keep yourself healthy. What should I know about diet, weight, and exercise? Eat a healthy diet  Eat a diet that includes plenty of vegetables, fruits, low-fat dairy products, and lean protein. Do not eat a lot of foods that are high in solid fats, added sugars, or sodium. Maintain a healthy weight Body mass index (BMI) is a measurement that can be used to identify possible weight problems. It estimates body fat based on height and weight. Your health care provider can help determine your BMI and help you achieve or maintain a healthy weight. Get regular exercise Get regular exercise. This is one of the most important things you can do for your health. Most adults should: Exercise for at least 150 minutes each week. The exercise should increase your heart rate and make you sweat (moderate-intensity exercise). Do strengthening exercises at least twice a week. This is in addition to the moderate-intensity exercise. Spend less time sitting. Even light physical activity can be beneficial. Watch cholesterol and blood lipids Have your blood tested for lipids and cholesterol at 52 years of age, then have this test every 5 years. You may need to have your cholesterol levels checked more often if: Your lipid or cholesterol levels are high. You are older than 52 years of age. You are at high risk for heart disease. What should I know about cancer screening? Many types of cancers can be detected early and may often be prevented. Depending on your health history and family history, you may need to have cancer screening at various ages. This may include screening for: Colorectal cancer. Prostate cancer. Skin cancer. Lung  cancer. What should I know about heart disease, diabetes, and high blood pressure? Blood pressure and heart disease High blood pressure causes heart disease and increases the risk of stroke. This is more likely to develop in people who have high blood pressure readings, are of African descent, or are overweight. Talk with your health care provider about your target blood pressure readings. Have your blood pressure checked: Every 3-5 years if you are 18-39 years of age. Every year if you are 40 years old or older. If you are between the ages of 65 and 75 and are a current or former smoker, ask your health care provider if you should have a one-time screening for abdominal aortic aneurysm (AAA). Diabetes Have regular diabetes screenings. This checks your fasting blood sugar level. Have the screening done: Once every three years after age 45 if you are at a normal weight and have a low risk for diabetes. More often and at a younger age if you are overweight or have a high risk for diabetes. What should I know about preventing infection? Hepatitis B If you have a higher risk for hepatitis B, you should be screened for this virus. Talk with your health care provider to find out if you are at risk for hepatitis B infection. Hepatitis C Blood testing is recommended for: Everyone born from 1945 through 1965. Anyone with known risk factors for hepatitis C. Sexually transmitted infections (STIs) You should be screened each year for STIs, including gonorrhea and chlamydia, if: You are sexually active and are younger than 52 years of age. You are older than 52 years   of age and your health care provider tells you that you are at risk for this type of infection. Your sexual activity has changed since you were last screened, and you are at increased risk for chlamydia or gonorrhea. Ask your health care provider if you are at risk. Ask your health care provider about whether you are at high risk for HIV.  Your health care provider may recommend a prescription medicine to help prevent HIV infection. If you choose to take medicine to prevent HIV, you should first get tested for HIV. You should then be tested every 3 months for as long as you are taking the medicine. Follow these instructions at home: Lifestyle Do not use any products that contain nicotine or tobacco, such as cigarettes, e-cigarettes, and chewing tobacco. If you need help quitting, ask your health care provider. Do not use street drugs. Do not share needles. Ask your health care provider for help if you need support or information about quitting drugs. Alcohol use Do not drink alcohol if your health care provider tells you not to drink. If you drink alcohol: Limit how much you have to 0-2 drinks a day. Be aware of how much alcohol is in your drink. In the [place] one drink equals one 12 oz bottle of beer (355 mL), one 5 oz glass of wine (148 mL), or one 1 oz glass of hard liquor (44 mL). General instructions Schedule regular health, dental, and eye exams. Stay current with your vaccines. Tell your health care provider if: You often feel depressed. You have ever been abused or do not feel safe at home. Summary Adopting a healthy lifestyle and getting preventive care are important in promoting health and wellness. Follow your health care provider's instructions about healthy diet, exercising, and getting tested or screened for diseases. Follow your health care provider's instructions on monitoring your cholesterol and blood pressure. This information is not intended to replace advice given to you by your health care provider. Make sure you discuss any questions you have with your health care provider. Document Revised: 06/01/2020 Document Reviewed: 03/17/2018 Elsevier Patient Education  2022 Elsevier Inc.  

## 2021-03-05 ENCOUNTER — Other Ambulatory Visit (HOSPITAL_COMMUNITY): Payer: Self-pay

## 2021-03-05 MED FILL — Omeprazole Cap Delayed Release 20 MG: ORAL | 90 days supply | Qty: 90 | Fill #2 | Status: AC

## 2021-03-15 ENCOUNTER — Other Ambulatory Visit (HOSPITAL_COMMUNITY): Payer: Self-pay

## 2021-03-15 MED ORDER — CARESTART COVID-19 HOME TEST VI KIT
PACK | 0 refills | Status: DC
  Filled 2021-03-15: qty 4, 4d supply, fill #0

## 2021-04-23 ENCOUNTER — Encounter: Payer: Self-pay | Admitting: Gastroenterology

## 2021-05-02 ENCOUNTER — Ambulatory Visit: Payer: 59

## 2021-06-24 ENCOUNTER — Ambulatory Visit (INDEPENDENT_AMBULATORY_CARE_PROVIDER_SITE_OTHER): Payer: 59

## 2021-06-24 ENCOUNTER — Ambulatory Visit: Payer: Self-pay

## 2021-06-24 ENCOUNTER — Other Ambulatory Visit: Payer: Self-pay

## 2021-06-24 ENCOUNTER — Ambulatory Visit (INDEPENDENT_AMBULATORY_CARE_PROVIDER_SITE_OTHER): Payer: 59 | Admitting: Family Medicine

## 2021-06-24 VITALS — BP 142/88 | HR 46 | Ht 71.0 in | Wt 187.6 lb

## 2021-06-24 DIAGNOSIS — G8929 Other chronic pain: Secondary | ICD-10-CM | POA: Diagnosis not present

## 2021-06-24 DIAGNOSIS — M25551 Pain in right hip: Secondary | ICD-10-CM | POA: Diagnosis not present

## 2021-06-24 NOTE — Progress Notes (Signed)
? ?I, Peterson Lombard, LAT, ATC acting as a scribe for Lynne Leader, MD. ? ?Subjective:   ? ?CC: R hip pain ? ?HPI: Pt is a 51 y/o male c/o R hip pain intermittently for a few months. Pt locates pain to the lateral aspect of the R hip and slightly into the posterior aspect. Pt is a runner, had been training for a 1/2 marathon. Pt was running about 10 miles on the weekends and a couple 3-5 miles runs during the week.  He has a half marathon this upcoming weekend. ? ?Low back pain: no ?Radiates: no ?LE numbness/tingling: no ?LE weakness: no ?Aggravates: after a hard run ?Treatments tried: works w/ a Physiological scientist- stretching/strengthening, youtube stretches- figure-4, band stretches ? ?Pertinent review of Systems: No fevers or chills ? ?Relevant historical information: IBS and eosinophilic esophagitis ? ? ?Objective:   ? ?Vitals:  ? 06/24/21 1416  ?BP: (!) 142/88  ?Pulse: (!) 46  ?SpO2: 98%  ? ?General: Well Developed, well nourished, and in no acute distress.  ? ?MSK: Right lateral hip normal. ?Tender palpation at lateral iliac crest otherwise nontender. ?Normal hip motion. ?Strength slightly reduced 4+/5 strength with pain. ? ?Lab and Radiology Results ? ?Diagnostic Limited MSK Ultrasound of: Right lateral hip ?At iliac crest area of maximal tenderness he does have what appears to be either an avulsion fragment or calcific tendinitis at the origin of the tensor fascia lata. 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 ? ?X-ray images right hip obtained today personally and independently interpreted ?Avulsion fragment or enthesiopathy changes present at iliac crest.  No giving it acute fracture or significant DJD present.   ?Await formal radiology review ? ? ?Impression and Recommendations:   ? ?Assessment and Plan: ?51 y.o. male with right lateral hip pain and iliac crest with running.  Pain is located along the iliac  crest.  He really should do some physical therapy for this.  I think iontophoresis would be very helpful.  However he has a half marathon coming up in under a week so he does not have enough time.  We discussed options.  He is going to go running tonight and see how he feels.  If he does not feel good enough he will return on Wednesday in 2 days for steroid injection.  He should probably consider physical therapy after he finishes his half marathon. ? ?PDMP not reviewed this encounter. ?Orders Placed This Encounter  ?Procedures  ? [place] LIMITED JOINT SPACE STRUCTURES LOW RIGHT(NO LINKED CHARGES)  ?  Order Specific Question:   Reason for Exam (SYMPTOM  OR DIAGNOSIS REQUIRED)  ?  Answer:   right hip apin  ?  Order Specific Question:   Preferred imaging location?  ?  Answer:   [facility]  ? DG HIP UNILAT W OR W/O PELVIS 2-3 VIEWS RIGHT  ?  Standing Status:   Future  ?  Number of Occurrences:   1  ?  Standing Expiration Date:   07/25/2021  ?  Order Specific Question:   Reason for Exam (SYMPTOM  OR DIAGNOSIS REQUIRED)  ?  Answer:   right hip pain  ?  Order Specific Question:   Preferred imaging location?  ?  Answer:   [facility]  ? ?No orders of the defined types were placed in this encounter. ? ? ?Discussed warning signs or symptoms. Please see discharge instructions. Patient expresses understanding. ? ? ?  The above documentation has been reviewed and is accurate and complete Lynne Leader, M.D. ? ?

## 2021-06-24 NOTE — Patient Instructions (Addendum)
Thank you for coming in today.  ? ?Please get an Xray today before you leave  ? ?Have a good run tomorrow. See how it goes. ? ?Schedule for Wed for the steroid injection. ?

## 2021-06-25 ENCOUNTER — Encounter: Payer: Self-pay | Admitting: Family Medicine

## 2021-06-25 ENCOUNTER — Other Ambulatory Visit: Payer: Self-pay | Admitting: Gastroenterology

## 2021-06-25 ENCOUNTER — Other Ambulatory Visit (HOSPITAL_COMMUNITY): Payer: Self-pay

## 2021-06-27 NOTE — Progress Notes (Signed)
Right hip x-ray shows a little bit of arthritis changes present.

## 2021-07-02 ENCOUNTER — Other Ambulatory Visit (HOSPITAL_COMMUNITY): Payer: Self-pay

## 2021-07-02 ENCOUNTER — Other Ambulatory Visit: Payer: Self-pay | Admitting: Gastroenterology

## 2021-07-02 MED ORDER — OMEPRAZOLE 20 MG PO CPDR
20.0000 mg | DELAYED_RELEASE_CAPSULE | Freq: Every day | ORAL | 0 refills | Status: DC
  Filled 2021-07-02 – 2021-07-11 (×2): qty 90, 90d supply, fill #0

## 2021-07-07 ENCOUNTER — Encounter: Payer: Self-pay | Admitting: Family Medicine

## 2021-07-10 ENCOUNTER — Other Ambulatory Visit (HOSPITAL_COMMUNITY): Payer: Self-pay

## 2021-07-11 ENCOUNTER — Other Ambulatory Visit (HOSPITAL_COMMUNITY): Payer: Self-pay

## 2021-07-31 DIAGNOSIS — G43109 Migraine with aura, not intractable, without status migrainosus: Secondary | ICD-10-CM | POA: Diagnosis not present

## 2021-07-31 DIAGNOSIS — H2513 Age-related nuclear cataract, bilateral: Secondary | ICD-10-CM | POA: Diagnosis not present

## 2021-07-31 DIAGNOSIS — H04123 Dry eye syndrome of bilateral lacrimal glands: Secondary | ICD-10-CM | POA: Diagnosis not present

## 2021-07-31 DIAGNOSIS — Z9889 Other specified postprocedural states: Secondary | ICD-10-CM | POA: Diagnosis not present

## 2021-08-13 ENCOUNTER — Other Ambulatory Visit (HOSPITAL_COMMUNITY): Payer: Self-pay

## 2021-08-13 ENCOUNTER — Ambulatory Visit (INDEPENDENT_AMBULATORY_CARE_PROVIDER_SITE_OTHER): Payer: 59 | Admitting: Gastroenterology

## 2021-08-13 ENCOUNTER — Encounter: Payer: Self-pay | Admitting: Gastroenterology

## 2021-08-13 VITALS — BP 138/86 | HR 86 | Ht 71.0 in | Wt 186.9 lb

## 2021-08-13 DIAGNOSIS — K219 Gastro-esophageal reflux disease without esophagitis: Secondary | ICD-10-CM | POA: Diagnosis not present

## 2021-08-13 DIAGNOSIS — Z79899 Other long term (current) drug therapy: Secondary | ICD-10-CM

## 2021-08-13 DIAGNOSIS — K200 Eosinophilic esophagitis: Secondary | ICD-10-CM | POA: Diagnosis not present

## 2021-08-13 DIAGNOSIS — K580 Irritable bowel syndrome with diarrhea: Secondary | ICD-10-CM

## 2021-08-13 MED ORDER — OMEPRAZOLE 20 MG PO CPDR
20.0000 mg | DELAYED_RELEASE_CAPSULE | Freq: Every day | ORAL | 3 refills | Status: DC
  Filled 2021-08-13 – 2021-11-29 (×2): qty 90, 90d supply, fill #0
  Filled 2022-02-25: qty 90, 90d supply, fill #1

## 2021-08-13 NOTE — Patient Instructions (Addendum)
If you are age 54 or older, your body mass index should be between 23-30. Your Body mass index is 26.07 kg/m?Marland Kitchen If this is out of the aforementioned range listed, please consider follow up with your Primary Care Provider. ? ?If you are age 58 or younger, your body mass index should be between 19-25. Your Body mass index is 26.07 kg/m?Marland Kitchen If this is out of the aformentioned range listed, please consider follow up with your Primary Care Provider.  ? ?________________________________________________________ ? ?The [facility] providers would like to encourage you to use [facility] to communicate with providers for non-urgent requests or questions.  Due to long hold times on the telephone, sending your provider a message by [facility] may be a faster and more efficient way to get a response.  Please allow 48 business hours for a response.  Please remember that this is for non-urgent requests.  ?_______________________________________________________ ? ?We have sent the following medications to your pharmacy for you to pick up at your convenience: ?Omeprazole 20 mg: Take once daily ? ?Please follow up in 1 year. ? ?Thank you for entrusting me with your care and for choosing [employer], ?Dr. [place] Cellar ? ? ? ?

## 2021-08-13 NOTE — Progress Notes (Signed)
? ?HPI :  ?51 year old male here for follow-up visit for EOE, GERD, history of colon polyps.  He was last seen in December 2021. Please see prior notes regarding full history of this case. He has a history of eosinophilic esophagitis with history of food impactions.  He has had prior allergy testing which has shown allergies to milk, tomatoes, squash, and almonds.  He has previously been on oral Flovent as well as PPIs in the past.  ?  ?He been doing pretty well since have last seen him.  He is taking omeprazole 20 mg once daily for the most part.  This typically will control his symptoms of heartburn and regurgitation.  If he misses doses however after a few days he will have recurrent symptoms.  He does not have very frequent breakthrough, this happens rarely and he may take a Pepcid as needed but that is not common.  No significant dysphagia.  He is really trying to avoid known food allergens he states last summer he had an episode of dysphagia/chest discomfort after he ate some known food allergens but generally is able to avoid that.  He denies any history of osteopenia/osteoporosis, or chronic kidney disease.  His last EGD was in 2017 as outlined below. ? ?Recall he has a history of IBS-D suspected.  Negative testing for celiac disease, prior colonoscopy showed no evidence of microscopic colitis.  He tried Viberzi in the past but did not tolerate it too well so stopped it.  He has used Imodium as needed in the past, states he does not like how it makes him feel, can cause cramping.  He does think that if he avoids drinking beer his bowels are not as bothersome.  He avoided all alcohol in January and states his stomach felt much better.  He enjoys drinking beer occasionally. ?  ?Prior evaluation:  ?EGD 01/2011 - distal esophageal stricture, dilated with 17m Savory, increased Eos to 37 / HPF ?EGD 06/2015 - subtle trachealization and furrowing of the esophagus, without stenosis, with the remainder of the exam  being normal. Biopsies show increased eosinophils of the esophagus  ?  ?Colonoscopy 05/17/19 - The perianal and digital rectal examinations were normal. ?- The terminal ileum appeared normal. ?- A few small-mouthed diverticula were found in the sigmoid colon. ?- Two sessile polyps were found in the sigmoid colon. The polyps were 2 to 3 mm in size. ?These polyps were removed with a cold biopsy forceps. Resection and retrieval were ?complete. ?- Internal hemorrhoids were found during retroflexion. The hemorrhoids were small. ?- The exam was otherwise without abnormality. No inflammatory changes. ?- Biopsies for histology were taken with a cold forceps from the right colon, left colon and ?transverse colon for evaluation of microscopic colitis. ?  ?1. Surgical [P], transverse and right and left colon ?- COLONIC MUCOSA WITH NO SIGNIFICANT PATHOLOGIC FINDINGS. ?- NEGATIVE FOR ACTIVE INFLAMMATION AND OTHER ABNORMALITIES. ?2. Surgical [P], colon, sigmoid, polyp (2) ?- TUBULAR ADENOMA, NEGATIVE FOR HIGH GRADE DYSPLASIA. ?- HYPERPLASTIC POLYP. ?  ?Repeat colonoscopy in 7 years ?  ? ?Past Medical History:  ?Diagnosis Date  ? Allergy   ? Anxiety   ? Depression   ? Dysphagia   ? Eosinophilic esophagitis   ? GERD (gastroesophageal reflux disease)   ? History of shingles   ? Irritable bowel syndrome   ? ? ? ?Past Surgical History:  ?Procedure Laterality Date  ? lasix surgery    ? UPPER GASTROINTESTINAL ENDOSCOPY    ? ?Family  History  ?Problem Relation Age of Onset  ? Depression Father   ? Heart disease Father   ?     some coronary issues  ? Aneurysm Father   ?     kidney  ? Mental illness Father   ? Stroke Father   ? Heart attack Father   ? Colon cancer Paternal Grandmother   ?     per pt colon vs stomach   ? Diabetes Mother   ?     adult onset  ? Depression Mother   ? Cancer Brother   ? Drug abuse Brother   ? Early death Brother   ? Heart attack Sister   ? Stroke Sister   ? Healthy Sister   ? Diabetes Brother   ? Drug abuse  Brother   ? Hypertension Brother   ? Colon polyps Neg Hx   ? Esophageal cancer Neg Hx   ? Rectal cancer Neg Hx   ? Stomach cancer Neg Hx   ? ?Social History  ? ?Tobacco Use  ? Smoking status: Former  ?  Types: Cigarettes  ? Smokeless tobacco: Never  ?Vaping Use  ? Vaping Use: Never used  ?Substance Use Topics  ? Alcohol use: Yes  ?  Alcohol/week: 2.0 standard drinks  ?  Types: 1 Cans of beer, 1 Shots of liquor per week  ?  Comment: 2 drinks daily - 2 beers max  ? Drug use: No  ? ?Current Outpatient Medications  ?Medication Sig Dispense Refill  ? famotidine (PEPCID) 20 MG tablet Take 20 mg by mouth daily as needed for heartburn or indigestion.     ? fexofenadine (ALLEGRA) 180 MG tablet Take 180 mg by mouth daily.     ? hydrOXYzine (ATARAX/VISTARIL) 25 MG tablet TAKE 1/2 TO 1 TABLET BY MOUTH AT BEDTIME AS NEEDED FOR ANXIETY or sleep. 90 tablet 0  ? hyoscyamine (LEVSIN, ANASPAZ) 0.125 MG tablet Take 1 tablet (0.125 mg total) by mouth every 4 (four) hours as needed for cramping. 90 tablet 3  ? ibuprofen (ADVIL,MOTRIN) 200 MG tablet Take 200 mg by mouth every 6 (six) hours as needed.    ? naproxen sodium (ALEVE) 220 MG tablet Take 220 mg by mouth. As needed    ? omeprazole (PRILOSEC) 20 MG capsule Take 1 capsule (20 mg total) by mouth daily. 90 capsule 0  ? ondansetron (ZOFRAN) 4 MG tablet Take 1 tablet (4 mg total) by mouth every 8 (eight) hours as needed for nausea or vomiting. 12 tablet 0  ? ?No current facility-administered medications for this visit.  ? ?Allergies  ?Allergen Reactions  ? Other Diarrhea and Itching  ? Almond Physiological scientist Agent]   ? Milk-Related Compounds   ? ? ? ?Review of Systems: ?All systems reviewed and negative except where noted in HPI.  ? ? ?Lab Results  ?Component Value Date  ? WBC 3.8 (L) 01/28/2021  ? HGB 14.2 01/28/2021  ? HCT 42.0 01/28/2021  ? MCV 93.0 01/28/2021  ? PLT 238.0 01/28/2021  ? ? ?Lab Results  ?Component Value Date  ? CREATININE 0.94 01/28/2021  ? BUN 11  01/28/2021  ? NA 141 01/28/2021  ? K 4.4 01/28/2021  ? CL 102 01/28/2021  ? CO2 28 01/28/2021  ? ? ?Lab Results  ?Component Value Date  ? ALT 15 01/27/2020  ? AST 24 01/27/2020  ? ALKPHOS 64 01/27/2020  ? BILITOT 0.3 01/27/2020  ? ? ? ?Physical Exam: ?BP 138/86   Pulse  86   Ht '5\' 11"'$  (1.803 m)   Wt 186 lb 14.4 oz (84.8 kg)   SpO2 98%   BMI 26.07 kg/m?  ?Constitutional: Pleasant,well-developed, male in no acute distress. ?Neurological: Alert and oriented to person place and time. ?Psychiatric: Normal mood and affect. Behavior is normal. ? ? ?ASSESSMENT AND PLAN: ?51 year old male here for reassessment of the following: ? ?Eosinophilic esophagitis ?GERD ?Long-term use of PPI ?IBS-D ? ?Generally EOE has been well controlled by avoiding food allergens and maintaining with omeprazole.  If he does not take the omeprazole he does have some breakthrough GERD.  We have discussed long-term management options.  I think benefits of omeprazole outweigh the risks at this point, the omeprazole really controls his reflux well and the EOE.  We did discuss risks of long term PPI use which includes CKD, bone fracture risk etc., but he understands and wants to continue therapy.  He experiences dysphagia moving forward or problems with therapy, not working etc. he will let me know.  He should follow-up once yearly with me for this issue. ? ?Otherwise not taking much for his bowels at this time, at baseline seems to be doing okay.  Does have some food sensitivities, particularly beer can make symptoms worse.  If he wishes to pursue therapy for this at any point he should contact me.  Otherwise colonoscopy up-to-date. ? ?Jolly Mango, MD ?[facility] ? ?

## 2021-11-29 ENCOUNTER — Other Ambulatory Visit (HOSPITAL_COMMUNITY): Payer: Self-pay

## 2021-12-31 ENCOUNTER — Encounter: Payer: Self-pay | Admitting: Internal Medicine

## 2022-01-06 ENCOUNTER — Other Ambulatory Visit (HOSPITAL_COMMUNITY): Payer: Self-pay

## 2022-01-06 ENCOUNTER — Encounter: Payer: Self-pay | Admitting: Internal Medicine

## 2022-01-06 ENCOUNTER — Ambulatory Visit: Payer: 59 | Admitting: Internal Medicine

## 2022-01-06 ENCOUNTER — Telehealth: Payer: Self-pay

## 2022-01-06 VITALS — BP 126/82 | HR 72 | Temp 98.6°F | Resp 16 | Ht 71.0 in | Wt 192.0 lb

## 2022-01-06 DIAGNOSIS — G43801 Other migraine, not intractable, with status migrainosus: Secondary | ICD-10-CM | POA: Diagnosis not present

## 2022-01-06 DIAGNOSIS — F5104 Psychophysiologic insomnia: Secondary | ICD-10-CM | POA: Insufficient documentation

## 2022-01-06 MED ORDER — BELSOMRA 10 MG PO TABS
10.0000 mg | ORAL_TABLET | Freq: Every evening | ORAL | 0 refills | Status: DC | PRN
  Filled 2022-01-06: qty 30, 30d supply, fill #0
  Filled 2022-01-06: qty 90, 90d supply, fill #0

## 2022-01-06 MED ORDER — ZAVZPRET 10 MG/ACT NA SOLN
1.0000 | Freq: Once | NASAL | 1 refills | Status: DC | PRN
  Filled 2022-01-06: qty 6, 6d supply, fill #0

## 2022-01-06 NOTE — Progress Notes (Signed)
Subjective:  Patient ID: Darren Best, male    DOB: 09-24-1970  Age: 51 y.o. MRN: [id number]  CC: Headache   HPI MARTIN BELLING presents for f/up -  He complains of a 1 year history of visual disturbance in his right eye.  It occurs about once a month.  The visual disturbance is usually triggered by change in lighting.  He saw an ophthalmologist about 5 months ago and was told that he had ocular migraines.  The sensation lasts about 15 to 20 minutes with a mild headache but no nausea, vomiting, or paresthesias.  The headaches have improved modestly with Excedrin.  He also complains of middle insomnia and does not respond well to melatonin.  Outpatient Medications Prior to Visit  Medication Sig Dispense Refill   famotidine (PEPCID) 20 MG tablet Take 20 mg by mouth daily as needed for heartburn or indigestion.      fexofenadine (ALLEGRA) 180 MG tablet Take 180 mg by mouth daily.      hydrOXYzine (ATARAX/VISTARIL) 25 MG tablet TAKE 1/2 TO 1 TABLET BY MOUTH AT BEDTIME AS NEEDED FOR ANXIETY or sleep. 90 tablet 0   hyoscyamine (LEVSIN, ANASPAZ) 0.125 MG tablet Take 1 tablet (0.125 mg total) by mouth every 4 (four) hours as needed for cramping. 90 tablet 3   ibuprofen (ADVIL,MOTRIN) 200 MG tablet Take 200 mg by mouth every 6 (six) hours as needed.     naproxen sodium (ALEVE) 220 MG tablet Take 220 mg by mouth. As needed     omeprazole (PRILOSEC) 20 MG capsule Take 1 capsule (20 mg total) by mouth daily. 90 capsule 3   ondansetron (ZOFRAN) 4 MG tablet Take 1 tablet (4 mg total) by mouth every 8 (eight) hours as needed for nausea or vomiting. 12 tablet 0   No facility-administered medications prior to visit.    ROS Review of Systems  Constitutional: Negative.   HENT: Negative.    Eyes:  Positive for visual disturbance. Negative for photophobia, pain and redness.  Respiratory:  Negative for cough, chest tightness, shortness of breath and wheezing.   Cardiovascular:  Negative for chest pain,  palpitations and leg swelling.  Gastrointestinal:  Negative for abdominal pain, diarrhea, nausea and vomiting.  Endocrine: Negative.   Genitourinary: Negative.   Musculoskeletal: Negative.  Negative for back pain and myalgias.  Skin: Negative.   Neurological:  Positive for headaches. Negative for dizziness, tremors, seizures, syncope, speech difficulty, weakness, light-headedness and numbness.  Hematological:  Negative for adenopathy. Does not bruise/bleed easily.  Psychiatric/Behavioral:  Positive for sleep disturbance. Negative for suicidal ideas.     Objective:  BP 126/82 (BP Location: Right Arm, Patient Position: Sitting, Cuff Size: Large)   Pulse 72   Temp 98.6 F (37 C) (Oral)   Resp 16   Ht '5\' 11"'$  (1.803 m)   Wt 192 lb (87.1 kg)   SpO2 98%   BMI 26.78 kg/m   BP Readings from Last 3 Encounters:  01/06/22 126/82  08/13/21 138/86  06/24/21 (!) 142/88    Wt Readings from Last 3 Encounters:  01/06/22 192 lb (87.1 kg)  08/13/21 186 lb 14.4 oz (84.8 kg)  06/24/21 187 lb 9.6 oz (85.1 kg)    Physical Exam Vitals reviewed.  Constitutional:      Appearance: He is not ill-appearing.  Eyes:     General: No scleral icterus.    Extraocular Movements: Extraocular movements intact.     Conjunctiva/sclera: Conjunctivae normal.  Cardiovascular:  Rate and Rhythm: Normal rate and regular rhythm.     Heart sounds: No murmur heard. Pulmonary:     Effort: Pulmonary effort is normal.     Breath sounds: No stridor. No wheezing, rhonchi or rales.  Abdominal:     General: Abdomen is flat.     Palpations: There is no mass.     Tenderness: There is no abdominal tenderness. There is no guarding.     Hernia: No hernia is present.  Musculoskeletal:        General: Normal range of motion.     Cervical back: Neck supple.     Right lower leg: No edema.     Left lower leg: No edema.  Lymphadenopathy:     Cervical: No cervical adenopathy.  Skin:    General: Skin is warm and dry.   Neurological:     General: No focal deficit present.     Mental Status: He is alert. Mental status is at baseline.     Cranial Nerves: Cranial nerves 2-12 are intact.     Sensory: Sensation is intact.     Motor: Motor function is intact.     Coordination: Coordination is intact.     Gait: Gait is intact.     Deep Tendon Reflexes: Reflexes normal.     Reflex Scores:      Tricep reflexes are 0 on the right side and 0 on the left side.      Bicep reflexes are 0 on the right side and 0 on the left side.      Brachioradialis reflexes are 0 on the right side and 0 on the left side.      Patellar reflexes are 1+ on the right side and 1+ on the left side.      Achilles reflexes are 0 on the right side and 0 on the left side. Psychiatric:        Mood and Affect: Mood normal.        Behavior: Behavior normal.     Lab Results  Component Value Date   WBC 3.8 (L) 01/28/2021   HGB 14.2 01/28/2021   HCT 42.0 01/28/2021   PLT 238.0 01/28/2021   GLUCOSE 102 (H) 01/28/2021   CHOL 215 (H) 01/28/2021   TRIG 66.0 01/28/2021   HDL 71.10 01/28/2021   LDLCALC 131 (H) 01/28/2021   ALT 15 01/27/2020   AST 24 01/27/2020   NA 141 01/28/2021   K 4.4 01/28/2021   CL 102 01/28/2021   CREATININE 0.94 01/28/2021   BUN 11 01/28/2021   CO2 28 01/28/2021   TSH 2.070 11/24/2018   PSA 0.45 01/28/2021   HGBA1C 5.9 01/28/2021    MR TIBIA FIBULA RIGHT WO CONTRAST  Result Date: 03/27/2019 CLINICAL DATA:  Anterior right lower leg pain with running for 3 months. Suspected stress fracture. EXAM: MRI OF LOWER RIGHT EXTREMITY WITHOUT CONTRAST TECHNIQUE: Multiplanar, multisequence MR imaging of the right lower leg was performed. No intravenous contrast was administered. COMPARISON:  Radiographs 01/11/2019. FINDINGS: Bones/Joint/Cartilage Both lower legs are included on the coronal images. There is no evidence of acute fracture or dislocation. There is no asymmetric cortical thickening or marrow edema in the right  tibia or fibula. No significant effusions or arthropathic changes are seen at either knee or ankle. Ligaments Not relevant for exam/indication. Muscles and Tendons The muscles and visualized tendons of the right lower leg appear unremarkable. Soft tissues Mild asymmetric pretibial subcutaneous edema on the right without focal fluid  collection. The soft tissues of the right lower leg otherwise appear unremarkable. No significant vascular findings on noncontrast imaging. IMPRESSION: 1. Mild asymmetric pretibial subcutaneous edema on the right may represent mild/early medial tibial stress syndrome (shin splints). 2. No evidence of stress fracture or other significant findings. Electronically Signed   By: Richardean Sale M.D.   On: 03/27/2019 13:59    Assessment & Plan:   Rockne was seen today for headache.  Diagnoses and all orders for this visit:  Ocular migraine with status migrainosus, not intractable- Will treat with a CGRP antagonist. -     Zavegepant HCl (ZAVZPRET) 10 MG/ACT SOLN; Place 1 Act into the nose once as needed for up to 1 dose.  Psychophysiological insomnia -     Suvorexant (BELSOMRA) 10 MG TABS; Take 10 mg by mouth at bedtime as needed.   I am having [name] start on [place] and [place]. I am also having him maintain his famotidine, ondansetron, ibuprofen, fexofenadine, hyoscyamine, hydrOXYzine, naproxen sodium, and omeprazole.  Meds ordered this encounter  Medications   Zavegepant HCl (ZAVZPRET) 10 MG/ACT SOLN    Sig: Place 1 Act into the nose once as needed for up to 1 dose.    Dispense:  6 each    Refill:  1   Suvorexant (BELSOMRA) 10 MG TABS    Sig: Take 10 mg by mouth at bedtime as needed.    Dispense:  90 tablet    Refill:  0     Follow-up: Return in about 3 months (around 04/08/2022).  Scarlette Calico, MD

## 2022-01-06 NOTE — Telephone Encounter (Signed)
Key: [id number]

## 2022-01-06 NOTE — Patient Instructions (Signed)
Migraine Headache A migraine headache is an intense, throbbing pain on one side or both sides of the head. Migraine headaches may also cause other symptoms, such as nausea, vomiting, and sensitivity to light and noise. A migraine headache can last from 4 hours to 3 days. Talk with your doctor about what things may bring on (trigger) your migraine headaches. What are the causes? The exact cause of this condition is not known. However, a migraine may be caused when nerves in the brain become irritated and release chemicals that cause inflammation of blood vessels. This inflammation causes pain. This condition may be triggered or caused by: Drinking alcohol. Smoking. Taking medicines, such as: Medicine used to treat chest pain (nitroglycerin). Birth control pills. Estrogen. Certain blood pressure medicines. Eating or drinking products that contain nitrates, glutamate, aspartame, or tyramine. Aged cheeses, chocolate, or caffeine may also be triggers. Doing physical activity. Other things that may trigger a migraine headache include: Menstruation. Pregnancy. Hunger. Stress. Lack of sleep or too much sleep. Weather changes. Fatigue. What increases the risk? The following factors may make you more likely to experience migraine headaches: Being a certain age. This condition is more common in people who are 25-55 years old. Being male. Having a family history of migraine headaches. Being Caucasian. Having a mental health condition, such as depression or anxiety. Being obese. What are the signs or symptoms? The main symptom of this condition is pulsating or throbbing pain. This pain may: Happen in any area of the head, such as on one side or both sides. Interfere with daily activities. Get worse with physical activity. Get worse with exposure to bright lights or loud noises. Other symptoms may include: Nausea. Vomiting. Dizziness. General sensitivity to bright lights, loud noises, or  smells. Before you get a migraine headache, you may get warning signs (an aura). An aura may include: Seeing flashing lights or having blind spots. Seeing bright spots, halos, or zigzag lines. Having tunnel vision or blurred vision. Having numbness or a tingling feeling. Having trouble talking. Having muscle weakness. Some people have symptoms after a migraine headache (postdromal phase), such as: Feeling tired. Difficulty concentrating. How is this diagnosed? A migraine headache can be diagnosed based on: Your symptoms. A physical exam. Tests, such as: CT scan or an MRI of the head. These imaging tests can help rule out other causes of headaches. Taking fluid from the spine (lumbar puncture) and analyzing it (cerebrospinal fluid analysis, or CSF analysis). How is this treated? This condition may be treated with medicines that: Relieve pain. Relieve nausea. Prevent migraine headaches. Treatment for this condition may also include: Acupuncture. Lifestyle changes like avoiding foods that trigger migraine headaches. Biofeedback. Cognitive behavioral therapy. Follow these instructions at home: Medicines Take over-the-counter and prescription medicines only as told by your health care provider. Ask your health care provider if the medicine prescribed to you: Requires you to avoid driving or using heavy machinery. Can cause constipation. You may need to take these actions to prevent or treat constipation: Drink enough fluid to keep your urine pale yellow. Take over-the-counter or prescription medicines. Eat foods that are high in fiber, such as beans, whole grains, and fresh fruits and vegetables. Limit foods that are high in fat and processed sugars, such as fried or sweet foods. Lifestyle Do not drink alcohol. Do not use any products that contain nicotine or tobacco, such as cigarettes, e-cigarettes, and chewing tobacco. If you need help quitting, ask your health care  provider. Get at least 8   hours of sleep every night. Find ways to manage stress, such as meditation, deep breathing, or yoga. General instructions     Keep a journal to find out what may trigger your migraine headaches. For example, write down: What you eat and drink. How much sleep you get. Any change to your diet or medicines. If you have a migraine headache: Avoid things that make your symptoms worse, such as bright lights. It may help to lie down in a dark, quiet room. Do not drive or use heavy machinery. Ask your health care provider what activities are safe for you while you are experiencing symptoms. Keep all follow-up visits as told by your health care provider. This is important. Contact a health care provider if: You develop symptoms that are different or more severe than your usual migraine headache symptoms. You have more than 15 headache days in one month. Get help right away if: Your migraine headache becomes severe. Your migraine headache lasts longer than 72 hours. You have a fever. You have a stiff neck. You have vision loss. Your muscles feel weak or like you cannot control them. You start to lose your balance often. You have trouble walking. You faint. You have a seizure. Summary A migraine headache is an intense, throbbing pain on one side or both sides of the head. Migraines may also cause other symptoms, such as nausea, vomiting, and sensitivity to light and noise. This condition may be treated with medicines and lifestyle changes. You may also need to avoid certain things that trigger a migraine headache. Keep a journal to find out what may trigger your migraine headaches. Contact your health care provider if you have more than 15 headache days in a month or you develop symptoms that are different or more severe than your usual migraine headache symptoms. This information is not intended to replace advice given to you by your health care provider. Make sure  you discuss any questions you have with your health care provider. Document Revised: 07/16/2018 Document Reviewed: 05/06/2018 Elsevier Patient Education  2023 [employer]  

## 2022-01-08 ENCOUNTER — Other Ambulatory Visit: Payer: Self-pay | Admitting: Internal Medicine

## 2022-01-08 ENCOUNTER — Other Ambulatory Visit (HOSPITAL_COMMUNITY): Payer: Self-pay

## 2022-01-08 DIAGNOSIS — G43801 Other migraine, not intractable, with status migrainosus: Secondary | ICD-10-CM

## 2022-01-08 MED ORDER — UBRELVY 100 MG PO TABS
1.0000 | ORAL_TABLET | Freq: Every day | ORAL | 0 refills | Status: DC | PRN
  Filled 2022-01-08: qty 9, 30d supply, fill #0

## 2022-01-08 NOTE — Telephone Encounter (Signed)
Per [facility] PA was denied.  Reason given: Pt does not meet the medical criteria guidelines.  Try 2 of the following: Reyvow, Nurtec, and Ubrelvy. Previously tried and have contraindication to 1 triptain.  Determination sent to scan.

## 2022-01-09 ENCOUNTER — Other Ambulatory Visit (HOSPITAL_COMMUNITY): Payer: Self-pay

## 2022-01-16 DIAGNOSIS — M25551 Pain in right hip: Secondary | ICD-10-CM | POA: Diagnosis not present

## 2022-01-16 DIAGNOSIS — R293 Abnormal posture: Secondary | ICD-10-CM | POA: Diagnosis not present

## 2022-01-16 DIAGNOSIS — R260 Ataxic gait: Secondary | ICD-10-CM | POA: Diagnosis not present

## 2022-01-16 DIAGNOSIS — M5386 Other specified dorsopathies, lumbar region: Secondary | ICD-10-CM | POA: Diagnosis not present

## 2022-01-16 DIAGNOSIS — M25552 Pain in left hip: Secondary | ICD-10-CM | POA: Diagnosis not present

## 2022-01-17 DIAGNOSIS — R293 Abnormal posture: Secondary | ICD-10-CM | POA: Diagnosis not present

## 2022-01-17 DIAGNOSIS — M25552 Pain in left hip: Secondary | ICD-10-CM | POA: Diagnosis not present

## 2022-01-17 DIAGNOSIS — R260 Ataxic gait: Secondary | ICD-10-CM | POA: Diagnosis not present

## 2022-01-17 DIAGNOSIS — M5386 Other specified dorsopathies, lumbar region: Secondary | ICD-10-CM | POA: Diagnosis not present

## 2022-01-17 DIAGNOSIS — M25551 Pain in right hip: Secondary | ICD-10-CM | POA: Diagnosis not present

## 2022-01-22 DIAGNOSIS — R293 Abnormal posture: Secondary | ICD-10-CM | POA: Diagnosis not present

## 2022-01-22 DIAGNOSIS — R260 Ataxic gait: Secondary | ICD-10-CM | POA: Diagnosis not present

## 2022-01-22 DIAGNOSIS — M5386 Other specified dorsopathies, lumbar region: Secondary | ICD-10-CM | POA: Diagnosis not present

## 2022-01-22 DIAGNOSIS — M25552 Pain in left hip: Secondary | ICD-10-CM | POA: Diagnosis not present

## 2022-01-22 DIAGNOSIS — M25551 Pain in right hip: Secondary | ICD-10-CM | POA: Diagnosis not present

## 2022-01-27 DIAGNOSIS — M25552 Pain in left hip: Secondary | ICD-10-CM | POA: Diagnosis not present

## 2022-01-27 DIAGNOSIS — M25551 Pain in right hip: Secondary | ICD-10-CM | POA: Diagnosis not present

## 2022-01-27 DIAGNOSIS — R293 Abnormal posture: Secondary | ICD-10-CM | POA: Diagnosis not present

## 2022-01-27 DIAGNOSIS — M5386 Other specified dorsopathies, lumbar region: Secondary | ICD-10-CM | POA: Diagnosis not present

## 2022-01-27 DIAGNOSIS — R260 Ataxic gait: Secondary | ICD-10-CM | POA: Diagnosis not present

## 2022-01-29 ENCOUNTER — Other Ambulatory Visit (HOSPITAL_BASED_OUTPATIENT_CLINIC_OR_DEPARTMENT_OTHER): Payer: Self-pay

## 2022-01-29 ENCOUNTER — Encounter: Payer: Self-pay | Admitting: Internal Medicine

## 2022-01-29 ENCOUNTER — Ambulatory Visit (INDEPENDENT_AMBULATORY_CARE_PROVIDER_SITE_OTHER): Payer: 59 | Admitting: Internal Medicine

## 2022-01-29 ENCOUNTER — Other Ambulatory Visit (HOSPITAL_COMMUNITY): Payer: Self-pay

## 2022-01-29 VITALS — BP 148/88 | HR 61 | Temp 98.3°F | Ht 71.0 in | Wt 180.0 lb

## 2022-01-29 DIAGNOSIS — F5104 Psychophysiologic insomnia: Secondary | ICD-10-CM

## 2022-01-29 DIAGNOSIS — R030 Elevated blood-pressure reading, without diagnosis of hypertension: Secondary | ICD-10-CM | POA: Diagnosis not present

## 2022-01-29 DIAGNOSIS — Z0000 Encounter for general adult medical examination without abnormal findings: Secondary | ICD-10-CM | POA: Diagnosis not present

## 2022-01-29 DIAGNOSIS — M5416 Radiculopathy, lumbar region: Secondary | ICD-10-CM | POA: Insufficient documentation

## 2022-01-29 DIAGNOSIS — R7303 Prediabetes: Secondary | ICD-10-CM | POA: Diagnosis not present

## 2022-01-29 DIAGNOSIS — K200 Eosinophilic esophagitis: Secondary | ICD-10-CM | POA: Diagnosis not present

## 2022-01-29 MED ORDER — MELOXICAM 15 MG PO TABS
15.0000 mg | ORAL_TABLET | Freq: Every day | ORAL | 0 refills | Status: AC
  Filled 2022-01-29: qty 90, 90d supply, fill #0

## 2022-01-29 MED ORDER — TIZANIDINE HCL 4 MG PO TABS
4.0000 mg | ORAL_TABLET | Freq: Three times a day (TID) | ORAL | 0 refills | Status: DC | PRN
  Filled 2022-01-29: qty 270, 90d supply, fill #0

## 2022-01-29 MED ORDER — BELSOMRA 20 MG PO TABS
1.0000 | ORAL_TABLET | Freq: Every evening | ORAL | 1 refills | Status: DC | PRN
  Filled 2022-01-29: qty 90, 90d supply, fill #0

## 2022-01-29 NOTE — Patient Instructions (Signed)
Hypertension, Adult High blood pressure (hypertension) is when the force of blood pumping through the arteries is too strong. The arteries are the blood vessels that carry blood from the heart throughout the body. Hypertension forces the heart to work harder to pump blood and may cause arteries to become narrow or stiff. Untreated or uncontrolled hypertension can lead to a heart attack, heart failure, a stroke, kidney disease, and other problems. A blood pressure reading consists of a higher number over a lower number. Ideally, your blood pressure should be below 120/80. The first ("top") number is called the systolic pressure. It is a measure of the pressure in your arteries as your heart beats. The second ("bottom") number is called the diastolic pressure. It is a measure of the pressure in your arteries as the heart relaxes. What are the causes? The exact cause of this condition is not known. There are some conditions that result in high blood pressure. What increases the risk? Certain factors may make you more likely to develop high blood pressure. Some of these risk factors are under your control, including: Smoking. Not getting enough exercise or physical activity. Being overweight. Having too much fat, sugar, calories, or salt (sodium) in your diet. Drinking too much alcohol. Other risk factors include: Having a personal history of heart disease, diabetes, high cholesterol, or kidney disease. Stress. Having a family history of high blood pressure and high cholesterol. Having obstructive sleep apnea. Age. The risk increases with age. What are the signs or symptoms? High blood pressure may not cause symptoms. Very high blood pressure (hypertensive crisis) may cause: Headache. Fast or irregular heartbeats (palpitations). Shortness of breath. Nosebleed. Nausea and vomiting. Vision changes. Severe chest pain, dizziness, and seizures. How is this diagnosed? This condition is diagnosed by  measuring your blood pressure while you are seated, with your arm resting on a flat surface, your legs uncrossed, and your feet flat on the floor. The cuff of the blood pressure monitor will be placed directly against the skin of your upper arm at the level of your heart. Blood pressure should be measured at least twice using the same arm. Certain conditions can cause a difference in blood pressure between your right and left arms. If you have a high blood pressure reading during one visit or you have normal blood pressure with other risk factors, you may be asked to: Return on a different day to have your blood pressure checked again. Monitor your blood pressure at home for 1 week or longer. If you are diagnosed with hypertension, you may have other blood or imaging tests to help your health care provider understand your overall risk for other conditions. How is this treated? This condition is treated by making healthy lifestyle changes, such as eating healthy foods, exercising more, and reducing your alcohol intake. You may be referred for counseling on a healthy diet and physical activity. Your health care provider may prescribe medicine if lifestyle changes are not enough to get your blood pressure under control and if: Your systolic blood pressure is above 130. Your diastolic blood pressure is above 80. Your personal target blood pressure may vary depending on your medical conditions, your age, and other factors. Follow these instructions at home: Eating and drinking  Eat a diet that is high in fiber and potassium, and low in sodium, added sugar, and fat. An example of this eating plan is called the DASH diet. DASH stands for Dietary Approaches to Stop Hypertension. To eat this way: Eat   plenty of fresh fruits and vegetables. Try to fill one half of your plate at each meal with fruits and vegetables. Eat whole grains, such as whole-wheat pasta, brown rice, or whole-grain bread. Fill about one  fourth of your plate with whole grains. Eat or drink low-fat dairy products, such as skim milk or low-fat yogurt. Avoid fatty cuts of meat, processed or cured meats, and poultry with skin. Fill about one fourth of your plate with lean proteins, such as fish, chicken without skin, beans, eggs, or tofu. Avoid pre-made and processed foods. These tend to be higher in sodium, added sugar, and fat. Reduce your daily sodium intake. Many people with hypertension should eat less than 1,500 mg of sodium a day. Do not drink alcohol if: Your health care provider tells you not to drink. You are pregnant, may be pregnant, or are planning to become pregnant. If you drink alcohol: Limit how much you have to: 0-1 drink a day for women. 0-2 drinks a day for men. Know how much alcohol is in your drink. In the [place] one drink equals one 12 oz bottle of beer (355 mL), one 5 oz glass of wine (148 mL), or one 1 oz glass of hard liquor (44 mL). Lifestyle  Work with your health care provider to maintain a healthy body weight or to lose weight. Ask what an ideal weight is for you. Get at least 30 minutes of exercise that causes your heart to beat faster (aerobic exercise) most days of the week. Activities may include walking, swimming, or biking. Include exercise to strengthen your muscles (resistance exercise), such as Pilates or lifting weights, as part of your weekly exercise routine. Try to do these types of exercises for 30 minutes at least 3 days a week. Do not use any products that contain nicotine or tobacco. These products include cigarettes, chewing tobacco, and vaping devices, such as e-cigarettes. If you need help quitting, ask your health care provider. Monitor your blood pressure at home as told by your health care provider. Keep all follow-up visits. This is important. Medicines Take over-the-counter and prescription medicines only as told by your health care provider. Follow directions carefully. Blood  pressure medicines must be taken as prescribed. Do not skip doses of blood pressure medicine. Doing this puts you at risk for problems and can make the medicine less effective. Ask your health care provider about side effects or reactions to medicines that you should watch for. Contact a health care provider if you: Think you are having a reaction to a medicine you are taking. Have headaches that keep coming back (recurring). Feel dizzy. Have swelling in your ankles. Have trouble with your vision. Get help right away if you: Develop a severe headache or confusion. Have unusual weakness or numbness. Feel faint. Have severe pain in your chest or abdomen. Vomit repeatedly. Have trouble breathing. These symptoms may be an emergency. Get help right away. Call 911. Do not wait to see if the symptoms will go away. Do not drive yourself to the hospital. Summary Hypertension is when the force of blood pumping through your arteries is too strong. If this condition is not controlled, it may put you at risk for serious complications. Your personal target blood pressure may vary depending on your medical conditions, your age, and other factors. For most people, a normal blood pressure is less than 120/80. Hypertension is treated with lifestyle changes, medicines, or a combination of both. Lifestyle changes include losing weight, eating a healthy,   low-sodium diet, exercising more, and limiting alcohol. This information is not intended to replace advice given to you by your health care provider. Make sure you discuss any questions you have with your health care provider. Document Revised: 01/29/2021 Document Reviewed: 01/29/2021 Elsevier Patient Education  2023 [employer]  

## 2022-01-29 NOTE — Progress Notes (Unsigned)
Subjective:  Patient ID: Darren Best, male    DOB: [birth year]  Age: 51 y.o. MRN: [id number]  CC: Annual Exam, Back Pain, and Hypertension   HPI Darren Best presents for a CPX and f/up -   He complains of a 3 week hx of LBP that briefly radiated into his left thigh. He has been doing PT with some improvement and getting some relief with a MR and nsaid. He denies N/W/T.  Outpatient Medications Prior to Visit  Medication Sig Dispense Refill   famotidine (PEPCID) 20 MG tablet Take 20 mg by mouth daily as needed for heartburn or indigestion.      fexofenadine (ALLEGRA) 180 MG tablet Take 180 mg by mouth daily.      omeprazole (PRILOSEC) 20 MG capsule Take 1 capsule (20 mg total) by mouth daily. 90 capsule 3   Ubrogepant (UBRELVY) 100 MG TABS Take 1 tablet by mouth daily as needed. 30 tablet 0   hydrOXYzine (ATARAX/VISTARIL) 25 MG tablet TAKE 1/2 TO 1 TABLET BY MOUTH AT BEDTIME AS NEEDED FOR ANXIETY or sleep. 90 tablet 0   hyoscyamine (LEVSIN, ANASPAZ) 0.125 MG tablet Take 1 tablet (0.125 mg total) by mouth every 4 (four) hours as needed for cramping. 90 tablet 3   ibuprofen (ADVIL,MOTRIN) 200 MG tablet Take 200 mg by mouth every 6 (six) hours as needed.     naproxen sodium (ALEVE) 220 MG tablet Take 220 mg by mouth. As needed     ondansetron (ZOFRAN) 4 MG tablet Take 1 tablet (4 mg total) by mouth every 8 (eight) hours as needed for nausea or vomiting. 12 tablet 0   Suvorexant (BELSOMRA) 10 MG TABS Take 1 tablet ( 10 mg) by mouth at bedtime as needed. 90 tablet 0   No facility-administered medications prior to visit.    ROS Review of Systems  Constitutional: Negative.  Negative for diaphoresis and fatigue.  HENT: Negative.  Negative for trouble swallowing.   Eyes: Negative.   Respiratory:  Negative for cough, chest tightness, shortness of breath and wheezing.   Cardiovascular:  Negative for chest pain, palpitations and leg swelling.  Gastrointestinal: Negative.  Negative for  abdominal pain, blood in stool, nausea and vomiting.  Endocrine: Negative.   Genitourinary: Negative.  Negative for difficulty urinating.  Musculoskeletal:  Positive for back pain. Negative for arthralgias and myalgias.  Skin: Negative.   Neurological: Negative.  Negative for dizziness, seizures, weakness, light-headedness, numbness and headaches.  Hematological:  Negative for adenopathy. Does not bruise/bleed easily.  Psychiatric/Behavioral:  Positive for sleep disturbance. The patient is not nervous/anxious.     Objective:  BP (!) 148/88 (BP Location: Left Arm, Patient Position: Sitting, Cuff Size: Large)   Pulse 61   Temp 98.3 F (36.8 C) (Oral)   Ht '5\' 11"'$  (1.803 m)   Wt 180 lb (81.6 kg)   SpO2 99%   BMI 25.10 kg/m   BP Readings from Last 3 Encounters:  01/29/22 (!) 148/88  01/06/22 126/82  08/13/21 138/86    Wt Readings from Last 3 Encounters:  01/29/22 180 lb (81.6 kg)  01/06/22 192 lb (87.1 kg)  08/13/21 186 lb 14.4 oz (84.8 kg)    Physical Exam Vitals reviewed.  HENT:     Nose: Nose normal.     Mouth/Throat:     Mouth: Mucous membranes are moist.  Eyes:     General: No scleral icterus.    Conjunctiva/sclera: Conjunctivae normal.  Cardiovascular:     Rate and Rhythm:  Regular rhythm. Bradycardia present.     Heart sounds: Normal heart sounds, S1 normal and S2 normal.     No friction rub. No gallop.     Comments: EKG- SB, 59 bpm with SA No LVH or Q waves Pulmonary:     Breath sounds: No stridor. No wheezing, rhonchi or rales.  Abdominal:     General: Abdomen is flat.     Palpations: There is no mass.     Tenderness: There is no abdominal tenderness. There is no guarding or rebound.     Hernia: No hernia is present. There is no hernia in the left inguinal area or right inguinal area.  Genitourinary:    Pubic Area: No rash.      Penis: Normal and circumcised.      Testes: Normal.     Epididymis:     Right: Normal.     Left: Normal.     Prostate: Not  enlarged, not tender and no nodules present.     Rectum: Guaiac result negative. Internal hemorrhoid present. No mass, tenderness, anal fissure or external hemorrhoid. Normal anal tone.  Musculoskeletal:     Cervical back: Neck supple.     Thoracic back: Normal.     Lumbar back: Normal. No bony tenderness. Normal range of motion. Negative right straight leg raise test and negative left straight leg raise test.     Right lower leg: No edema.     Left lower leg: No edema.  Lymphadenopathy:     Cervical: No cervical adenopathy.     Lower Body: No right inguinal adenopathy. No left inguinal adenopathy.  Neurological:     General: No focal deficit present.     Mental Status: He is alert.     Cranial Nerves: Cranial nerves 2-12 are intact.     Sensory: Sensation is intact.     Motor: Motor function is intact. No weakness.     Coordination: Coordination is intact.     Deep Tendon Reflexes: Reflexes normal.     Reflex Scores:      Tricep reflexes are 1+ on the right side and 1+ on the left side.      Bicep reflexes are 1+ on the right side and 1+ on the left side.      Brachioradialis reflexes are 1+ on the right side and 1+ on the left side.      Patellar reflexes are 1+ on the right side and 1+ on the left side.      Achilles reflexes are 1+ on the right side and 1+ on the left side.    Lab Results  Component Value Date   WBC 3.8 (L) 01/28/2021   HGB 14.2 01/28/2021   HCT 42.0 01/28/2021   PLT 238.0 01/28/2021   GLUCOSE 102 (H) 01/28/2021   CHOL 215 (H) 01/28/2021   TRIG 66.0 01/28/2021   HDL 71.10 01/28/2021   LDLCALC 131 (H) 01/28/2021   ALT 15 01/27/2020   AST 24 01/27/2020   NA 141 01/28/2021   K 4.4 01/28/2021   CL 102 01/28/2021   CREATININE 0.94 01/28/2021   BUN 11 01/28/2021   CO2 28 01/28/2021   TSH 2.070 11/24/2018   PSA 0.45 01/28/2021   HGBA1C 5.9 01/28/2021    MR TIBIA FIBULA RIGHT WO CONTRAST  Result Date: 03/27/2019 CLINICAL DATA:  Anterior right lower  leg pain with running for 3 months. Suspected stress fracture. EXAM: MRI OF LOWER RIGHT EXTREMITY WITHOUT CONTRAST TECHNIQUE: Multiplanar, multisequence  MR imaging of the right lower leg was performed. No intravenous contrast was administered. COMPARISON:  Radiographs 01/11/2019. FINDINGS: Bones/Joint/Cartilage Both lower legs are included on the coronal images. There is no evidence of acute fracture or dislocation. There is no asymmetric cortical thickening or marrow edema in the right tibia or fibula. No significant effusions or arthropathic changes are seen at either knee or ankle. Ligaments Not relevant for exam/indication. Muscles and Tendons The muscles and visualized tendons of the right lower leg appear unremarkable. Soft tissues Mild asymmetric pretibial subcutaneous edema on the right without focal fluid collection. The soft tissues of the right lower leg otherwise appear unremarkable. No significant vascular findings on noncontrast imaging. IMPRESSION: 1. Mild asymmetric pretibial subcutaneous edema on the right may represent mild/early medial tibial stress syndrome (shin splints). 2. No evidence of stress fracture or other significant findings. Electronically Signed   By: Richardean Sale M.D.   On: 03/27/2019 13:59    Assessment & Plan:   Ezreal was seen today for annual exam, back pain and hypertension.  Diagnoses and all orders for this visit:  Psychophysiological insomnia -     TSH; Future -     TSH -     Suvorexant (BELSOMRA) 20 MG TABS; Take 20 mg by mouth at bedtime as needed.  Eosinophilic esophagitis -     CBC with Differential/Platelet; Future -     CBC with Differential/Platelet  Routine general medical examination at a health care facility -     Lipid panel; Future -     PSA; Future -     PSA -     Lipid panel  Prediabetes -     Hemoglobin A1c; Future -     Basic metabolic panel; Future -     Basic metabolic panel -     Hemoglobin A1c  Left lumbar radiculitis -      tiZANidine (ZANAFLEX) 4 MG tablet; Take 1 tablet (4 mg total) by mouth every 8 (eight) hours as needed for muscle spasms. -     meloxicam (MOBIC) 15 MG tablet; Take 1 tablet (15 mg total) by mouth daily.  Elevated systolic blood pressure reading without diagnosis of hypertension -     Aldosterone + renin activity w/ ratio; Future -     TSH; Future -     Urinalysis, Routine w reflex microscopic; Future -     Hepatic function panel; Future -     Basic metabolic panel; Future -     EKG 12-Lead -     Basic metabolic panel -     Hepatic function panel -     Urinalysis, Routine w reflex microscopic -     TSH -     Aldosterone + renin activity w/ ratio   I have discontinued [name]'s ondansetron, ibuprofen, hyoscyamine, hydrOXYzine, naproxen sodium, and Belsomra. I am also having him start on tiZANidine, meloxicam, and Belsomra. Additionally, I am having him maintain his famotidine, fexofenadine, omeprazole, and Ubrelvy.  Meds ordered this encounter  Medications   tiZANidine (ZANAFLEX) 4 MG tablet    Sig: Take 1 tablet (4 mg total) by mouth every 8 (eight) hours as needed for muscle spasms.    Dispense:  270 tablet    Refill:  0   meloxicam (MOBIC) 15 MG tablet    Sig: Take 1 tablet (15 mg total) by mouth daily.    Dispense:  90 tablet    Refill:  0   Suvorexant (BELSOMRA) 20  MG TABS    Sig: Take 20 mg by mouth at bedtime as needed.    Dispense:  90 tablet    Refill:  1     Follow-up: Return in about 6 months (around 07/31/2022).  Scarlette Calico, MD

## 2022-01-30 ENCOUNTER — Other Ambulatory Visit (HOSPITAL_COMMUNITY): Payer: Self-pay

## 2022-01-30 LAB — TSH: TSH: 2.08 u[IU]/mL (ref 0.35–5.50)

## 2022-01-30 LAB — URINALYSIS, ROUTINE W REFLEX MICROSCOPIC
Bilirubin Urine: NEGATIVE
Hgb urine dipstick: NEGATIVE
Ketones, ur: NEGATIVE
Leukocytes,Ua: NEGATIVE
Nitrite: NEGATIVE
RBC / HPF: NONE SEEN (ref 0–?)
Specific Gravity, Urine: 1.01 (ref 1.000–1.030)
Total Protein, Urine: NEGATIVE
Urine Glucose: NEGATIVE
Urobilinogen, UA: 0.2 (ref 0.0–1.0)
WBC, UA: NONE SEEN (ref 0–?)
pH: 7 (ref 5.0–8.0)

## 2022-01-30 LAB — CBC WITH DIFFERENTIAL/PLATELET
Basophils Absolute: 0 10*3/uL (ref 0.0–0.1)
Basophils Relative: 0.3 % (ref 0.0–3.0)
Eosinophils Absolute: 0.3 10*3/uL (ref 0.0–0.7)
Eosinophils Relative: 6.1 % — ABNORMAL HIGH (ref 0.0–5.0)
HCT: 40.6 % (ref 39.0–52.0)
Hemoglobin: 14 g/dL (ref 13.0–17.0)
Lymphocytes Relative: 41.3 % (ref 12.0–46.0)
Lymphs Abs: 1.9 10*3/uL (ref 0.7–4.0)
MCHC: 34.5 g/dL (ref 30.0–36.0)
MCV: 91.4 fl (ref 78.0–100.0)
Monocytes Absolute: 0.4 10*3/uL (ref 0.1–1.0)
Monocytes Relative: 7.8 % (ref 3.0–12.0)
Neutro Abs: 2.1 10*3/uL (ref 1.4–7.7)
Neutrophils Relative %: 44.5 % (ref 43.0–77.0)
Platelets: 256 10*3/uL (ref 150.0–400.0)
RBC: 4.45 Mil/uL (ref 4.22–5.81)
RDW: 13.2 % (ref 11.5–15.5)
WBC: 4.7 10*3/uL (ref 4.0–10.5)

## 2022-01-30 LAB — HEMOGLOBIN A1C: Hgb A1c MFr Bld: 6 % (ref 4.6–6.5)

## 2022-01-30 LAB — HEPATIC FUNCTION PANEL
ALT: 15 U/L (ref 0–53)
AST: 23 U/L (ref 0–37)
Albumin: 4.5 g/dL (ref 3.5–5.2)
Alkaline Phosphatase: 47 U/L (ref 39–117)
Bilirubin, Direct: 0.1 mg/dL (ref 0.0–0.3)
Total Bilirubin: 0.6 mg/dL (ref 0.2–1.2)
Total Protein: 7.5 g/dL (ref 6.0–8.3)

## 2022-01-30 LAB — PSA: PSA: 0.33 ng/mL (ref 0.10–4.00)

## 2022-01-30 LAB — BASIC METABOLIC PANEL
BUN: 12 mg/dL (ref 6–23)
CO2: 29 mEq/L (ref 19–32)
Calcium: 9.7 mg/dL (ref 8.4–10.5)
Chloride: 102 mEq/L (ref 96–112)
Creatinine, Ser: 0.9 mg/dL (ref 0.40–1.50)
GFR: 97.98 mL/min (ref >60.00)
Glucose, Bld: 96 mg/dL (ref 70–99)
Potassium: 4.9 mEq/L (ref 3.5–5.1)
Sodium: 139 mEq/L (ref 135–145)

## 2022-01-30 LAB — LIPID PANEL
Cholesterol: 182 mg/dL (ref 0–200)
HDL: 62.4 mg/dL (ref >39.00)
LDL Cholesterol: 91 mg/dL (ref 0–99)
NonHDL: 119.21
Total CHOL/HDL Ratio: 3
Triglycerides: 139 mg/dL (ref 0.0–149.0)
VLDL: 27.8 mg/dL (ref 0.0–40.0)

## 2022-01-31 ENCOUNTER — Other Ambulatory Visit (HOSPITAL_COMMUNITY): Payer: Self-pay

## 2022-01-31 DIAGNOSIS — R293 Abnormal posture: Secondary | ICD-10-CM | POA: Diagnosis not present

## 2022-01-31 DIAGNOSIS — R260 Ataxic gait: Secondary | ICD-10-CM | POA: Diagnosis not present

## 2022-01-31 DIAGNOSIS — M25551 Pain in right hip: Secondary | ICD-10-CM | POA: Diagnosis not present

## 2022-01-31 DIAGNOSIS — M5386 Other specified dorsopathies, lumbar region: Secondary | ICD-10-CM | POA: Diagnosis not present

## 2022-01-31 DIAGNOSIS — M25552 Pain in left hip: Secondary | ICD-10-CM | POA: Diagnosis not present

## 2022-02-03 ENCOUNTER — Other Ambulatory Visit (HOSPITAL_COMMUNITY): Payer: Self-pay

## 2022-02-03 DIAGNOSIS — R260 Ataxic gait: Secondary | ICD-10-CM | POA: Diagnosis not present

## 2022-02-03 DIAGNOSIS — M25552 Pain in left hip: Secondary | ICD-10-CM | POA: Diagnosis not present

## 2022-02-03 DIAGNOSIS — M5386 Other specified dorsopathies, lumbar region: Secondary | ICD-10-CM | POA: Diagnosis not present

## 2022-02-03 DIAGNOSIS — M25551 Pain in right hip: Secondary | ICD-10-CM | POA: Diagnosis not present

## 2022-02-03 DIAGNOSIS — R293 Abnormal posture: Secondary | ICD-10-CM | POA: Diagnosis not present

## 2022-02-05 DIAGNOSIS — R293 Abnormal posture: Secondary | ICD-10-CM | POA: Diagnosis not present

## 2022-02-05 DIAGNOSIS — R260 Ataxic gait: Secondary | ICD-10-CM | POA: Diagnosis not present

## 2022-02-05 DIAGNOSIS — M25552 Pain in left hip: Secondary | ICD-10-CM | POA: Diagnosis not present

## 2022-02-05 DIAGNOSIS — M5386 Other specified dorsopathies, lumbar region: Secondary | ICD-10-CM | POA: Diagnosis not present

## 2022-02-05 DIAGNOSIS — M25551 Pain in right hip: Secondary | ICD-10-CM | POA: Diagnosis not present

## 2022-02-08 LAB — ALDOSTERONE + RENIN ACTIVITY W/ RATIO
ALDO / PRA Ratio: 2.2 Ratio (ref 0.9–28.9)
Aldosterone: 2 ng/dL
Renin Activity: 0.92 ng/mL/h (ref 0.25–5.82)

## 2022-02-11 DIAGNOSIS — R293 Abnormal posture: Secondary | ICD-10-CM | POA: Diagnosis not present

## 2022-02-11 DIAGNOSIS — M25551 Pain in right hip: Secondary | ICD-10-CM | POA: Diagnosis not present

## 2022-02-11 DIAGNOSIS — R260 Ataxic gait: Secondary | ICD-10-CM | POA: Diagnosis not present

## 2022-02-11 DIAGNOSIS — M5386 Other specified dorsopathies, lumbar region: Secondary | ICD-10-CM | POA: Diagnosis not present

## 2022-02-11 DIAGNOSIS — M25552 Pain in left hip: Secondary | ICD-10-CM | POA: Diagnosis not present

## 2022-02-14 DIAGNOSIS — M25551 Pain in right hip: Secondary | ICD-10-CM | POA: Diagnosis not present

## 2022-02-14 DIAGNOSIS — R260 Ataxic gait: Secondary | ICD-10-CM | POA: Diagnosis not present

## 2022-02-14 DIAGNOSIS — R293 Abnormal posture: Secondary | ICD-10-CM | POA: Diagnosis not present

## 2022-02-14 DIAGNOSIS — M5386 Other specified dorsopathies, lumbar region: Secondary | ICD-10-CM | POA: Diagnosis not present

## 2022-02-14 DIAGNOSIS — M25552 Pain in left hip: Secondary | ICD-10-CM | POA: Diagnosis not present

## 2022-02-17 DIAGNOSIS — R293 Abnormal posture: Secondary | ICD-10-CM | POA: Diagnosis not present

## 2022-02-17 DIAGNOSIS — M5386 Other specified dorsopathies, lumbar region: Secondary | ICD-10-CM | POA: Diagnosis not present

## 2022-02-17 DIAGNOSIS — M25552 Pain in left hip: Secondary | ICD-10-CM | POA: Diagnosis not present

## 2022-02-17 DIAGNOSIS — R260 Ataxic gait: Secondary | ICD-10-CM | POA: Diagnosis not present

## 2022-02-17 DIAGNOSIS — M25551 Pain in right hip: Secondary | ICD-10-CM | POA: Diagnosis not present

## 2022-02-19 DIAGNOSIS — R293 Abnormal posture: Secondary | ICD-10-CM | POA: Diagnosis not present

## 2022-02-19 DIAGNOSIS — R260 Ataxic gait: Secondary | ICD-10-CM | POA: Diagnosis not present

## 2022-02-19 DIAGNOSIS — M25552 Pain in left hip: Secondary | ICD-10-CM | POA: Diagnosis not present

## 2022-02-19 DIAGNOSIS — M5386 Other specified dorsopathies, lumbar region: Secondary | ICD-10-CM | POA: Diagnosis not present

## 2022-02-19 DIAGNOSIS — M25551 Pain in right hip: Secondary | ICD-10-CM | POA: Diagnosis not present

## 2022-02-24 DIAGNOSIS — M5386 Other specified dorsopathies, lumbar region: Secondary | ICD-10-CM | POA: Diagnosis not present

## 2022-02-24 DIAGNOSIS — R293 Abnormal posture: Secondary | ICD-10-CM | POA: Diagnosis not present

## 2022-02-24 DIAGNOSIS — M25551 Pain in right hip: Secondary | ICD-10-CM | POA: Diagnosis not present

## 2022-02-24 DIAGNOSIS — M25552 Pain in left hip: Secondary | ICD-10-CM | POA: Diagnosis not present

## 2022-02-24 DIAGNOSIS — R260 Ataxic gait: Secondary | ICD-10-CM | POA: Diagnosis not present

## 2022-02-25 ENCOUNTER — Other Ambulatory Visit (HOSPITAL_COMMUNITY): Payer: Self-pay

## 2022-03-03 DIAGNOSIS — R293 Abnormal posture: Secondary | ICD-10-CM | POA: Diagnosis not present

## 2022-03-03 DIAGNOSIS — M25551 Pain in right hip: Secondary | ICD-10-CM | POA: Diagnosis not present

## 2022-03-03 DIAGNOSIS — M25552 Pain in left hip: Secondary | ICD-10-CM | POA: Diagnosis not present

## 2022-03-03 DIAGNOSIS — M5386 Other specified dorsopathies, lumbar region: Secondary | ICD-10-CM | POA: Diagnosis not present

## 2022-03-03 DIAGNOSIS — R260 Ataxic gait: Secondary | ICD-10-CM | POA: Diagnosis not present

## 2022-03-05 DIAGNOSIS — M25551 Pain in right hip: Secondary | ICD-10-CM | POA: Diagnosis not present

## 2022-03-05 DIAGNOSIS — M5386 Other specified dorsopathies, lumbar region: Secondary | ICD-10-CM | POA: Diagnosis not present

## 2022-03-05 DIAGNOSIS — M25552 Pain in left hip: Secondary | ICD-10-CM | POA: Diagnosis not present

## 2022-03-05 DIAGNOSIS — R293 Abnormal posture: Secondary | ICD-10-CM | POA: Diagnosis not present

## 2022-03-05 DIAGNOSIS — R260 Ataxic gait: Secondary | ICD-10-CM | POA: Diagnosis not present

## 2022-03-10 DIAGNOSIS — R293 Abnormal posture: Secondary | ICD-10-CM | POA: Diagnosis not present

## 2022-03-10 DIAGNOSIS — R260 Ataxic gait: Secondary | ICD-10-CM | POA: Diagnosis not present

## 2022-03-10 DIAGNOSIS — M25552 Pain in left hip: Secondary | ICD-10-CM | POA: Diagnosis not present

## 2022-03-10 DIAGNOSIS — M25551 Pain in right hip: Secondary | ICD-10-CM | POA: Diagnosis not present

## 2022-03-10 DIAGNOSIS — M5386 Other specified dorsopathies, lumbar region: Secondary | ICD-10-CM | POA: Diagnosis not present

## 2022-03-17 DIAGNOSIS — R293 Abnormal posture: Secondary | ICD-10-CM | POA: Diagnosis not present

## 2022-03-17 DIAGNOSIS — M25552 Pain in left hip: Secondary | ICD-10-CM | POA: Diagnosis not present

## 2022-03-17 DIAGNOSIS — M5386 Other specified dorsopathies, lumbar region: Secondary | ICD-10-CM | POA: Diagnosis not present

## 2022-03-17 DIAGNOSIS — M25551 Pain in right hip: Secondary | ICD-10-CM | POA: Diagnosis not present

## 2022-03-17 DIAGNOSIS — R260 Ataxic gait: Secondary | ICD-10-CM | POA: Diagnosis not present

## 2022-06-03 ENCOUNTER — Encounter: Payer: Self-pay | Admitting: Gastroenterology

## 2022-06-06 ENCOUNTER — Other Ambulatory Visit (HOSPITAL_COMMUNITY): Payer: Self-pay

## 2022-06-09 ENCOUNTER — Encounter: Payer: Self-pay | Admitting: Gastroenterology

## 2022-06-11 ENCOUNTER — Other Ambulatory Visit (HOSPITAL_COMMUNITY): Payer: Self-pay

## 2022-06-11 ENCOUNTER — Telehealth: Payer: 59 | Admitting: Physician Assistant

## 2022-06-11 DIAGNOSIS — J208 Acute bronchitis due to other specified organisms: Secondary | ICD-10-CM

## 2022-06-11 MED ORDER — FLUTICASONE PROPIONATE 50 MCG/ACT NA SUSP
2.0000 | Freq: Every day | NASAL | 0 refills | Status: AC
  Filled 2022-06-11: qty 16, 30d supply, fill #0

## 2022-06-11 MED ORDER — PROMETHAZINE-DM 6.25-15 MG/5ML PO SYRP
5.0000 mL | ORAL_SOLUTION | Freq: Four times a day (QID) | ORAL | 0 refills | Status: DC | PRN
  Filled 2022-06-11: qty 118, 6d supply, fill #0

## 2022-06-11 MED ORDER — ALBUTEROL SULFATE HFA 108 (90 BASE) MCG/ACT IN AERS
1.0000 | INHALATION_SPRAY | Freq: Four times a day (QID) | RESPIRATORY_TRACT | 0 refills | Status: DC | PRN
  Filled 2022-06-11: qty 6.7, 25d supply, fill #0

## 2022-06-11 NOTE — Progress Notes (Signed)
We are sorry that you are not feeling well.  Here is how we plan to help!  Based on your presentation I believe you most likely have A cough due to a virus.  This is called viral bronchitis and is best treated by rest, plenty of fluids and control of the cough.  You may use Ibuprofen or Tylenol as directed to help your symptoms.  I will prescribe Fluticasone nasal spray Use 2 sprays in each nostril daily for post nasal drainage and nasal congestion.    In addition you may use Promethazine DM Cough syrup Take 5 mL every 6 hours as needed for cough  Albuterol Use 1-2 puffs every 6 hours as needed for cough.   From your responses in the eVisit questionnaire you describe inflammation in the upper respiratory tract which is causing a significant cough.  This is commonly called Bronchitis and has four common causes:   Allergies Viral Infections Acid Reflux Bacterial Infection Allergies, viruses and acid reflux are treated by controlling symptoms or eliminating the cause. An example might be a cough caused by taking certain blood pressure medications. You stop the cough by changing the medication. Another example might be a cough caused by acid reflux. Controlling the reflux helps control the cough.  USE OF BRONCHODILATOR ("RESCUE") INHALERS: There is a risk from using your bronchodilator too frequently.  The risk is that over-reliance on a medication which only relaxes the muscles surrounding the breathing tubes can reduce the effectiveness of medications prescribed to reduce swelling and congestion of the tubes themselves.  Although you feel brief relief from the bronchodilator inhaler, your asthma may actually be worsening with the tubes becoming more swollen and filled with mucus.  This can delay other crucial treatments, such as oral steroid medications. If you need to use a bronchodilator inhaler daily, several times per day, you should discuss this with your provider.  There are probably better  treatments that could be used to keep your asthma under control.     HOME CARE Only take medications as instructed by your medical team. Complete the entire course of an antibiotic. Drink plenty of fluids and get plenty of rest. Avoid close contacts especially the very young and the elderly Cover your mouth if you cough or cough into your sleeve. Always remember to wash your hands A steam or ultrasonic humidifier can help congestion.   GET HELP RIGHT AWAY IF: You develop worsening fever. You become short of breath You cough up blood. Your symptoms persist after you have completed your treatment plan MAKE SURE YOU  Understand these instructions. Will watch your condition. Will get help right away if you are not doing well or get worse.    Thank you for choosing an e-visit.  Your e-visit answers were reviewed by a board certified advanced clinical practitioner to complete your personal care plan. Depending upon the condition, your plan could have included both over the counter or prescription medications.  Please review your pharmacy choice. Make sure the pharmacy is open so you can pick up prescription now. If there is a problem, you may contact your provider through [employer] and have the prescription routed to another pharmacy.  Your safety is important to [place]. If you have drug allergies check your prescription carefully.   For the next 24 hours you can use MyChart to ask questions about today's visit, request a non-urgent call back, or ask for a work or school excuse. You will get an email in the  next two days asking about your experience. I hope that your e-visit has been valuable and will speed your recovery.  I have spent 5 minutes in review of e-visit questionnaire, review and updating patient chart, medical decision making and response to patient.   Mar Daring, PA-C

## 2022-07-31 ENCOUNTER — Encounter: Payer: Self-pay | Admitting: Internal Medicine

## 2022-07-31 ENCOUNTER — Ambulatory Visit: Payer: 59 | Admitting: Internal Medicine

## 2022-07-31 VITALS — BP 118/72 | HR 53 | Temp 97.9°F | Resp 16 | Ht 71.0 in | Wt 184.0 lb

## 2022-07-31 DIAGNOSIS — Z23 Encounter for immunization: Secondary | ICD-10-CM | POA: Diagnosis not present

## 2022-07-31 DIAGNOSIS — R001 Bradycardia, unspecified: Secondary | ICD-10-CM | POA: Diagnosis not present

## 2022-07-31 DIAGNOSIS — R7303 Prediabetes: Secondary | ICD-10-CM

## 2022-07-31 LAB — BASIC METABOLIC PANEL WITH GFR
BUN: 12 mg/dL (ref 6–23)
CO2: 30 meq/L (ref 19–32)
Calcium: 9.4 mg/dL (ref 8.4–10.5)
Chloride: 103 meq/L (ref 96–112)
Creatinine, Ser: 1.04 mg/dL (ref 0.40–1.50)
GFR: 82.08 mL/min (ref >60.00)
Glucose, Bld: 99 mg/dL (ref 70–99)
Potassium: 4.6 meq/L (ref 3.5–5.1)
Sodium: 139 meq/L (ref 135–145)

## 2022-07-31 LAB — HEMOGLOBIN A1C: Hgb A1c MFr Bld: 5.9 % (ref 4.6–6.5)

## 2022-07-31 NOTE — Patient Instructions (Signed)
Gastroesophageal Reflux Disease, Adult Gastroesophageal reflux (GER) happens when acid from the stomach flows up into the tube that connects the mouth and the stomach (esophagus). Normally, food travels down the esophagus and stays in the stomach to be digested. However, when a person has GER, food and stomach acid sometimes move back up into the esophagus. If this becomes a more serious problem, the person may be diagnosed with a disease called gastroesophageal reflux disease (GERD). GERD occurs when the reflux: Happens often. Causes frequent or severe symptoms. Causes problems such as damage to the esophagus. When stomach acid comes in contact with the esophagus, the acid may cause inflammation in the esophagus. Over time, GERD may create small holes (ulcers) in the lining of the esophagus. What are the causes? This condition is caused by a problem with the muscle between the esophagus and the stomach (lower esophageal sphincter, or LES). Normally, the LES muscle closes after food passes through the esophagus to the stomach. When the LES is weakened or abnormal, it does not close properly, and that allows food and stomach acid to go back up into the esophagus. The LES can be weakened by certain dietary substances, medicines, and medical conditions, including: Tobacco use. Pregnancy. Having a hiatal hernia. Alcohol use. Certain foods and beverages, such as coffee, chocolate, onions, and peppermint. What increases the risk? You are more likely to develop this condition if you: Have an increased body weight. Have a connective tissue disorder. Take NSAIDs, such as ibuprofen. What are the signs or symptoms? Symptoms of this condition include: Heartburn. Difficult or painful swallowing and the feeling of having a lump in the throat. A bitter taste in the mouth. Bad breath and having a large amount of saliva. Having an upset or bloated stomach and belching. Chest pain. Different conditions can  cause chest pain. Make sure you see your health care provider if you experience chest pain. Shortness of breath or wheezing. Ongoing (chronic) cough or a nighttime cough. Wearing away of tooth enamel. Weight loss. How is this diagnosed? This condition may be diagnosed based on a medical history and a physical exam. To determine if you have mild or severe GERD, your health care provider may also monitor how you respond to treatment. You may also have tests, including: A test to examine your stomach and esophagus with a small camera (endoscopy). A test that measures the acidity level in your esophagus. A test that measures how much pressure is on your esophagus. A barium swallow or modified barium swallow test to show the shape, size, and functioning of your esophagus. How is this treated? Treatment for this condition may vary depending on how severe your symptoms are. Your health care provider may recommend: Changes to your diet. Medicine. Surgery. The goal of treatment is to help relieve your symptoms and to prevent complications. Follow these instructions at home: Eating and drinking  Follow a diet as recommended by your health care provider. This may involve avoiding foods and drinks such as: Coffee and tea, with or without caffeine. Drinks that contain alcohol. Energy drinks and sports drinks. Carbonated drinks or sodas. Chocolate and cocoa. Peppermint and mint flavorings. Garlic and onions. Horseradish. Spicy and acidic foods, including peppers, chili powder, curry powder, vinegar, hot sauces, and barbecue sauce. Citrus fruit juices and citrus fruits, such as oranges, lemons, and limes. Tomato-based foods, such as red sauce, chili, salsa, and pizza with red sauce. Fried and fatty foods, such as donuts, french fries, potato chips, and high-fat dressings.   High-fat meats, such as hot dogs and fatty cuts of red and white meats, such as rib eye steak, sausage, ham, and  bacon. High-fat dairy items, such as whole milk, butter, and cream cheese. Eat small, frequent meals instead of large meals. Avoid drinking large amounts of liquid with your meals. Avoid eating meals during the 2-3 hours before bedtime. Avoid lying down right after you eat. Do not exercise right after you eat. Lifestyle  Do not use any products that contain nicotine or tobacco. These products include cigarettes, chewing tobacco, and vaping devices, such as e-cigarettes. If you need help quitting, ask your health care provider. Try to reduce your stress by using methods such as yoga or meditation. If you need help reducing stress, ask your health care provider. If you are overweight, reduce your weight to an amount that is healthy for you. Ask your health care provider for guidance about a safe weight loss goal. General instructions Pay attention to any changes in your symptoms. Take over-the-counter and prescription medicines only as told by your health care provider. Do not take aspirin, ibuprofen, or other NSAIDs unless your health care provider told you to take these medicines. Wear loose-fitting clothing. Do not wear anything tight around your waist that causes pressure on your abdomen. Raise (elevate) the head of your bed about 6 inches (15 cm). You can use a wedge to do this. Avoid bending over if this makes your symptoms worse. Keep all follow-up visits. This is important. Contact a health care provider if: You have: New symptoms. Unexplained weight loss. Difficulty swallowing or it hurts to swallow. Wheezing or a persistent cough. A hoarse voice. Your symptoms do not improve with treatment. Get help right away if: You have sudden pain in your arms, neck, jaw, teeth, or back. You suddenly feel sweaty, dizzy, or light-headed. You have chest pain or shortness of breath. You vomit and the vomit is green, yellow, or black, or it looks like blood or coffee grounds. You faint. You  have stool that is red, bloody, or black. You cannot swallow, drink, or eat. These symptoms may represent a serious problem that is an emergency. Do not wait to see if the symptoms will go away. Get medical help right away. Call your local emergency services (911 in the [place] Do not drive yourself to the hospital. Summary Gastroesophageal reflux happens when acid from the stomach flows up into the esophagus. GERD is a disease in which the reflux happens often, causes frequent or severe symptoms, or causes problems such as damage to the esophagus. Treatment for this condition may vary depending on how severe your symptoms are. Your health care provider may recommend diet and lifestyle changes, medicine, or surgery. Contact a health care provider if you have new or worsening symptoms. Take over-the-counter and prescription medicines only as told by your health care provider. Do not take aspirin, ibuprofen, or other NSAIDs unless your health care provider told you to do so. Keep all follow-up visits as told by your health care provider. This is important. This information is not intended to replace advice given to you by your health care provider. Make sure you discuss any questions you have with your health care provider. Document Revised: 10/03/2019 Document Reviewed: 10/03/2019 [employer] Patient Education  2023 [employer]  

## 2022-07-31 NOTE — Progress Notes (Signed)
Subjective:  Patient ID: Darren Best, male    DOB: 04/26/1970  Age: 52 y.o. MRN: [id number]  CC: Gastroesophageal Reflux   HPI Darren Best presents for f/up ---  He runs up to 8 miles a day.  His endurance is good.  He denies chest pain, shortness of breath, diaphoresis, dizziness, lightheadedness, palpitations, or near syncope.  Outpatient Medications Prior to Visit  Medication Sig Dispense Refill   famotidine (PEPCID) 20 MG tablet Take 20 mg by mouth daily as needed for heartburn or indigestion.      fexofenadine (ALLEGRA) 180 MG tablet Take 180 mg by mouth daily.      fluticasone (FLONASE) 50 MCG/ACT nasal spray Place 2 sprays into both nostrils daily. 16 g 0   meloxicam (MOBIC) 15 MG tablet Take 1 tablet (15 mg total) by mouth daily. 90 tablet 0   omeprazole (PRILOSEC) 20 MG capsule Take 1 capsule (20 mg total) by mouth daily. 90 capsule 3   Ubrogepant (UBRELVY) 100 MG TABS Take 1 tablet by mouth daily as needed. 30 tablet 0   albuterol (VENTOLIN HFA) 108 (90 Base) MCG/ACT inhaler Inhale 1-2 puffs into the lungs every 6 (six) hours as needed. 6.7 g 0   Suvorexant (BELSOMRA) 20 MG TABS Take 1 tablet by mouth at bedtime as needed. 90 tablet 1   promethazine-dextromethorphan (PROMETHAZINE-DM) 6.25-15 MG/5ML syrup Take 5 mLs by mouth 4 (four) times daily as needed. 118 mL 0   tiZANidine (ZANAFLEX) 4 MG tablet Take 1 tablet (4 mg total) by mouth every 8 (eight) hours as needed for muscle spasms. 270 tablet 0   No facility-administered medications prior to visit.    ROS Review of Systems  Constitutional: Negative.  Negative for diaphoresis, fatigue and unexpected weight change.  HENT: Negative.  Negative for sore throat and trouble swallowing.   Eyes: Negative.   Respiratory: Negative.  Negative for cough, shortness of breath and wheezing.   Cardiovascular:  Negative for chest pain, palpitations and leg swelling.  Gastrointestinal:  Negative for abdominal pain, constipation,  diarrhea, nausea and vomiting.  Endocrine: Negative.   Genitourinary: Negative.   Musculoskeletal: Negative.   Skin: Negative.   Neurological: Negative.  Negative for dizziness, syncope, weakness and light-headedness.  Hematological:  Negative for adenopathy. Does not bruise/bleed easily.  Psychiatric/Behavioral: Negative.      Objective:  BP 118/72 (BP Location: Left Arm, Patient Position: Sitting, Cuff Size: Large)   Pulse (!) 53   Temp 97.9 F (36.6 C) (Oral)   Resp 16   Ht 5\' 11"  (1.803 m)   Wt 184 lb (83.5 kg)   SpO2 97%   BMI 25.66 kg/m   BP Readings from Last 3 Encounters:  07/31/22 118/72  01/29/22 (!) 148/88  01/06/22 126/82    Wt Readings from Last 3 Encounters:  07/31/22 184 lb (83.5 kg)  01/29/22 180 lb (81.6 kg)  01/06/22 192 lb (87.1 kg)    Physical Exam Vitals reviewed.  HENT:     Mouth/Throat:     Mouth: Mucous membranes are moist.  Eyes:     General: No scleral icterus.    Conjunctiva/sclera: Conjunctivae normal.  Cardiovascular:     Rate and Rhythm: Regular rhythm. Bradycardia present.     Heart sounds: No murmur heard.    No gallop.  Pulmonary:     Breath sounds: No stridor. No wheezing, rhonchi or rales.  Abdominal:     General: Abdomen is flat.     Palpations: There is  no mass.     Tenderness: There is no abdominal tenderness. There is no guarding.     Hernia: No hernia is present.  Musculoskeletal:        General: Normal range of motion.     Cervical back: Neck supple.     Right lower leg: No edema.     Left lower leg: No edema.  Lymphadenopathy:     Cervical: No cervical adenopathy.  Skin:    General: Skin is warm and dry.  Neurological:     General: No focal deficit present.  Psychiatric:        Mood and Affect: Mood normal.        Behavior: Behavior normal.     Lab Results  Component Value Date   WBC 4.7 01/29/2022   HGB 14.0 01/29/2022   HCT 40.6 01/29/2022   PLT 256.0 01/29/2022   GLUCOSE 99 07/31/2022   CHOL 182  01/29/2022   TRIG 139.0 01/29/2022   HDL 62.40 01/29/2022   LDLCALC 91 01/29/2022   ALT 15 01/29/2022   AST 23 01/29/2022   NA 139 07/31/2022   K 4.6 07/31/2022   CL 103 07/31/2022   CREATININE 1.04 07/31/2022   BUN 12 07/31/2022   CO2 30 07/31/2022   TSH 2.08 01/29/2022   PSA 0.33 01/29/2022   HGBA1C 5.9 07/31/2022    MR TIBIA FIBULA RIGHT WO CONTRAST  Result Date: 03/27/2019 CLINICAL DATA:  Anterior right lower leg pain with running for 3 months. Suspected stress fracture. EXAM: MRI OF LOWER RIGHT EXTREMITY WITHOUT CONTRAST TECHNIQUE: Multiplanar, multisequence MR imaging of the right lower leg was performed. No intravenous contrast was administered. COMPARISON:  Radiographs 01/11/2019. FINDINGS: Bones/Joint/Cartilage Both lower legs are included on the coronal images. There is no evidence of acute fracture or dislocation. There is no asymmetric cortical thickening or marrow edema in the right tibia or fibula. No significant effusions or arthropathic changes are seen at either knee or ankle. Ligaments Not relevant for exam/indication. Muscles and Tendons The muscles and visualized tendons of the right lower leg appear unremarkable. Soft tissues Mild asymmetric pretibial subcutaneous edema on the right without focal fluid collection. The soft tissues of the right lower leg otherwise appear unremarkable. No significant vascular findings on noncontrast imaging. IMPRESSION: 1. Mild asymmetric pretibial subcutaneous edema on the right may represent mild/early medial tibial stress syndrome (shin splints). 2. No evidence of stress fracture or other significant findings. Electronically Signed   By: Carey Bullocks M.D.   On: 03/27/2019 13:59    Assessment & Plan:   Prediabetes- His A1c is down to 5.9%. -     Basic metabolic panel; Future -     Hemoglobin A1c; Future  Bradycardia- He is asymptomatic with this and based on his exercise regimen has chronotropic competence.  Other orders -      Tdap vaccine greater than or equal to 7yo IM     Follow-up: Return in about 6 months (around 01/30/2023).  Sanda Linger, MD

## 2022-09-18 ENCOUNTER — Encounter: Payer: Self-pay | Admitting: Gastroenterology

## 2022-09-18 ENCOUNTER — Ambulatory Visit: Payer: Managed Care, Other (non HMO) | Admitting: Gastroenterology

## 2022-09-18 VITALS — BP 108/70 | HR 60 | Ht 71.0 in | Wt 183.5 lb

## 2022-09-18 DIAGNOSIS — K200 Eosinophilic esophagitis: Secondary | ICD-10-CM | POA: Diagnosis not present

## 2022-09-18 DIAGNOSIS — K219 Gastro-esophageal reflux disease without esophagitis: Secondary | ICD-10-CM | POA: Diagnosis not present

## 2022-09-18 DIAGNOSIS — Z79899 Other long term (current) drug therapy: Secondary | ICD-10-CM | POA: Diagnosis not present

## 2022-09-18 DIAGNOSIS — K580 Irritable bowel syndrome with diarrhea: Secondary | ICD-10-CM | POA: Diagnosis not present

## 2022-09-18 NOTE — Patient Instructions (Signed)
If your blood pressure at your visit was 140/90 or greater, please contact your primary care physician to follow up on this. ______________________________________________________  If you are age 54 or older, your body mass index should be between 23-30. Your Body mass index is 25.59 kg/m. If this is out of the aforementioned range listed, please consider follow up with your Primary Care Provider.  If you are age 25 or younger, your body mass index should be between 19-25. Your Body mass index is 25.59 kg/m. If this is out of the aformentioned range listed, please consider follow up with your Primary Care Provider.  ________________________________________________________  The [place] [facility] providers would like to encourage you to use [facility] to communicate with providers for non-urgent requests or questions.  Due to long hold times on the telephone, sending your provider a message by [facility] may be a faster and more efficient way to get a response.  Please allow 48 business hours for a response.  Please remember that this is for non-urgent requests.  _______________________________________________________  Due to recent changes in healthcare laws, you may see the results of your imaging and laboratory studies on MyChart before your provider has had a chance to review them.  We understand that in some cases there may be results that are confusing or concerning to you. Not all laboratory results come back in the same time frame and the provider may be waiting for multiple results in order to interpret others.  Please give [place] 48 hours in order for your provider to thoroughly review all the results before contacting the office for clarification of your results.   Thank you for entrusting me with your care and for choosing [facility], Dr. Ileene Patrick

## 2022-09-18 NOTE — Progress Notes (Signed)
HPI : 52 year old male here for follow-up visit for EOE, GERD, IBS-D. Last seen May 2023.  He is doing pretty well since have last seen him.  Recall he has a history of EOE with history of food impactions remotely.  Prior allergy testing has shown allergies to milk, tomatoes, squash, and almonds.  He has been managing his symptoms with avoiding known food allergens and use of omeprazole.  Generally he denies any reflux or GERD symptoms that bother him on a routine basis.  He has not had any impactions since have seen him.  He has not had any symptoms of dysphagia.  He takes omeprazole a few days per week, and he states that generally works pretty well to control symptoms and is pretty happy with the regimen.  He has no history of CKD or osteoporosis.  Tolerating the medication okay.  His last EGD was in 2017, no Barrett's.  Recall he also has a history of IBS D that has been suspected.  He has had a prior colonoscopy in 2021 with no evidence of microscopic colitis.  He tested negative for celiac disease.  We have tried a few regimens over the years.  He did not like the low FODMAP diet too much.  There are some clear trigger foods that he thinks made his symptoms worse such as eating pork and he tries to avoid that.  He tried Viberzi remotely but had some side effects and did not tolerate it.  We had discussed using Imodium in the past but he states he has a hard time being compliant with that.  He generally has 2-4 bowel movements in the morning with some urgency and cramping but other than that his bowels do not bother him most of the rest of the day.  No blood in his stools.  We had previously discussed using some Imodium nightly, he has not done with much regularity yet.  We discussed other options that he could potentially try as well.    Prior evaluation:  EGD 01/2011 - distal esophageal stricture, dilated with 17mm Savory, increased Eos to 37 / HPF EGD 06/2015 - subtle trachealization and  furrowing of the esophagus, without stenosis, with the remainder of the exam being normal. Biopsies show increased eosinophils of the esophagus    Colonoscopy 05/17/19 - The perianal and digital rectal examinations were normal. - The terminal ileum appeared normal. - A few small-mouthed diverticula were found in the sigmoid colon. - Two sessile polyps were found in the sigmoid colon. The polyps were 2 to 3 mm in size. These polyps were removed with a cold biopsy forceps. Resection and retrieval were complete. - Internal hemorrhoids were found during retroflexion. The hemorrhoids were small. - The exam was otherwise without abnormality. No inflammatory changes. - Biopsies for histology were taken with a cold forceps from the right colon, left colon and transverse colon for evaluation of microscopic colitis.   1. Surgical [P], transverse and right and left colon - COLONIC MUCOSA WITH NO SIGNIFICANT PATHOLOGIC FINDINGS. - NEGATIVE FOR ACTIVE INFLAMMATION AND OTHER ABNORMALITIES. 2. Surgical [P], colon, sigmoid, polyp (2) - TUBULAR ADENOMA, NEGATIVE FOR HIGH GRADE DYSPLASIA. - HYPERPLASTIC POLYP.   Repeat colonoscopy in 7 years    Past Medical History:  Diagnosis Date   Allergy    Anxiety    Depression    Dysphagia    Eosinophilic esophagitis    GERD (gastroesophageal reflux disease)    History of shingles    Irritable bowel syndrome  Past Surgical History:  Procedure Laterality Date   lasix surgery     UPPER GASTROINTESTINAL ENDOSCOPY     Family History  Problem Relation Age of Onset   Depression Father    Heart disease Father        some coronary issues   Aneurysm Father        kidney   Mental illness Father    Stroke Father    Heart attack Father    Colon cancer Paternal Grandmother        per pt colon vs stomach    Diabetes Mother        adult onset   Depression Mother    Cancer Brother    Drug abuse Brother    Early death Brother    Heart attack Sister     Stroke Sister    Healthy Sister    Diabetes Brother    Drug abuse Brother    Hypertension Brother    Colon polyps Neg Hx    Esophageal cancer Neg Hx    Rectal cancer Neg Hx    Stomach cancer Neg Hx    Social History   Tobacco Use   Smoking status: Former    Types: Cigarettes   Smokeless tobacco: Never  Vaping Use   Vaping Use: Never used  Substance Use Topics   Alcohol use: Yes    Alcohol/week: 2.0 standard drinks of alcohol    Types: 1 Cans of beer, 1 Shots of liquor per week    Comment: 2 drinks daily - 2 beers max   Drug use: No   Current Outpatient Medications  Medication Sig Dispense Refill   famotidine (PEPCID) 20 MG tablet Take 20 mg by mouth daily as needed for heartburn or indigestion.      fexofenadine (ALLEGRA) 180 MG tablet Take 180 mg by mouth daily.      fluticasone (FLONASE) 50 MCG/ACT nasal spray Place 2 sprays into both nostrils daily. 16 g 0   meloxicam (MOBIC) 15 MG tablet Take 1 tablet (15 mg total) by mouth daily. (Patient taking differently: Take 15 mg by mouth as needed.) 90 tablet 0   omeprazole (PRILOSEC) 20 MG capsule Take 1 capsule (20 mg total) by mouth daily. 90 capsule 3   No current facility-administered medications for this visit.   Allergies  Allergen Reactions   Other Diarrhea and Itching   Almond Oil Bitter Flavor [Flavoring Agent]    Milk-Related Compounds      Review of Systems: All systems reviewed and negative except where noted in HPI.   Lab Results  Component Value Date   WBC 4.7 01/29/2022   HGB 14.0 01/29/2022   HCT 40.6 01/29/2022   MCV 91.4 01/29/2022   PLT 256.0 01/29/2022    Lab Results  Component Value Date   CREATININE 1.04 07/31/2022   BUN 12 07/31/2022   NA 139 07/31/2022   K 4.6 07/31/2022   CL 103 07/31/2022   CO2 30 07/31/2022    Lab Results  Component Value Date   ALT 15 01/29/2022   AST 23 01/29/2022   ALKPHOS 47 01/29/2022   BILITOT 0.6 01/29/2022     Physical Exam: BP 108/70 (BP  Location: Left Arm, Patient Position: Sitting, Cuff Size: Normal)   Pulse 60   Ht 5\' 11"  (1.803 m)   Wt 183 lb 8 oz (83.2 kg)   BMI 25.59 kg/m  Constitutional: Pleasant,well-developed, male in no acute distress. Neurological: Alert and oriented to person place  and time. Psychiatric: Normal mood and affect. Behavior is normal.   ASSESSMENT: 52 y.o. male here for assessment of the following  1. Eosinophilic esophagitis   2. Gastroesophageal reflux disease, unspecified whether esophagitis present   3. Long-term current use of proton pump inhibitor therapy   4. Irritable bowel syndrome with diarrhea    Generally doing really well since I have last seen him.  He has not had an impaction in a very long time, no symptoms of dysphagia or reflux that bother him.  He is using omeprazole a few days per week but intermittent low-dose has been working for him.  Symptoms are mainly controlled by avoiding known food allergens, he is doing a good job of doing this as best he can.  For now we will continue his regimen.  We discussed the long-term use of chronic PPIs and risks associated with that.  His kidney function is normal, he has no bone fractures/osteoporosis.  He understands risks, I think benefits continue to outweigh risks.  No need for EGD at this time given he is clinically feeling well, no history of BE.  Otherwise discussed his history of IBS.  Trying to avoid known triggers but still has some urgency with loose stools in the morning.  He is tried some fiber to bulk his stools and we discussed options for that, he will try Citrucel.  Otherwise I think taking some Imodium nightly may help him, he can titrate that up or down as needed and he will do that.  We discussed other options to include Colestid,/cholestyramine, Elavil, rifaximin, etc.  He would like to avoid using routine use of preventatives at this time but if symptoms bother him we can discuss these options further.  PLAN: - continue to  avoid known food allergens - continue omeprazole - use lowest dose needed to control symptoms - contact me if recurrent dysphagia  - he will try immodium one to two tabs q HS and titrate as needed. He will also consider adding daily fiber supplement - discussed other options for IBS, he wants to hold off for now  Harlin Rain, MD [facility]

## 2022-10-15 ENCOUNTER — Other Ambulatory Visit: Payer: Self-pay | Admitting: Oncology

## 2022-10-15 DIAGNOSIS — Z006 Encounter for examination for normal comparison and control in clinical research program: Secondary | ICD-10-CM

## 2022-12-03 ENCOUNTER — Other Ambulatory Visit (HOSPITAL_COMMUNITY): Payer: Self-pay

## 2022-12-03 ENCOUNTER — Other Ambulatory Visit (HOSPITAL_COMMUNITY)
Admission: RE | Admit: 2022-12-03 | Discharge: 2022-12-03 | Disposition: A | Discharge status: Home / Self Care | Payer: 59 | Source: Other Acute Inpatient Hospital | Attending: Oncology | Admitting: Oncology

## 2022-12-03 ENCOUNTER — Other Ambulatory Visit: Payer: Self-pay | Admitting: Gastroenterology

## 2022-12-03 DIAGNOSIS — Z006 Encounter for examination for normal comparison and control in clinical research program: Secondary | ICD-10-CM | POA: Insufficient documentation

## 2022-12-03 MED ORDER — OMEPRAZOLE 20 MG PO CPDR
20.0000 mg | DELAYED_RELEASE_CAPSULE | Freq: Every day | ORAL | 3 refills | Status: DC
  Filled 2022-12-03: qty 90, 90d supply, fill #0

## 2023-01-07 LAB — HELIX MOLECULAR SCREEN: Genetic Analysis Overall Interpretation: NEGATIVE

## 2023-02-04 ENCOUNTER — Ambulatory Visit (INDEPENDENT_AMBULATORY_CARE_PROVIDER_SITE_OTHER): Payer: Managed Care, Other (non HMO) | Admitting: Internal Medicine

## 2023-02-04 ENCOUNTER — Encounter: Payer: Self-pay | Admitting: Internal Medicine

## 2023-02-04 VITALS — BP 124/82 | HR 51 | Temp 97.9°F | Resp 16 | Ht 71.0 in | Wt 186.2 lb

## 2023-02-04 DIAGNOSIS — K200 Eosinophilic esophagitis: Secondary | ICD-10-CM

## 2023-02-04 DIAGNOSIS — Z0000 Encounter for general adult medical examination without abnormal findings: Secondary | ICD-10-CM

## 2023-02-04 DIAGNOSIS — G43801 Other migraine, not intractable, with status migrainosus: Secondary | ICD-10-CM

## 2023-02-04 DIAGNOSIS — R7303 Prediabetes: Secondary | ICD-10-CM

## 2023-02-04 DIAGNOSIS — R001 Bradycardia, unspecified: Secondary | ICD-10-CM

## 2023-02-04 DIAGNOSIS — E785 Hyperlipidemia, unspecified: Secondary | ICD-10-CM

## 2023-02-04 LAB — LIPID PANEL
Cholesterol: 192 mg/dL (ref 0–200)
HDL: 65.6 mg/dL (ref >39.00)
LDL Cholesterol: 107 mg/dL — ABNORMAL HIGH (ref 0–99)
NonHDL: 126.73
Total CHOL/HDL Ratio: 3
Triglycerides: 100 mg/dL (ref 0.0–149.0)
VLDL: 20 mg/dL (ref 0.0–40.0)

## 2023-02-04 LAB — CBC WITH DIFFERENTIAL/PLATELET
Basophils Absolute: 0 10*3/uL (ref 0.0–0.1)
Basophils Relative: 1.1 % (ref 0.0–3.0)
Eosinophils Absolute: 0.3 10*3/uL (ref 0.0–0.7)
Eosinophils Relative: 9.7 % — ABNORMAL HIGH (ref 0.0–5.0)
HCT: 42.8 % (ref 39.0–52.0)
Hemoglobin: 14.2 g/dL (ref 13.0–17.0)
Lymphocytes Relative: 35.4 % (ref 12.0–46.0)
Lymphs Abs: 1.2 10*3/uL (ref 0.7–4.0)
MCHC: 33.2 g/dL (ref 30.0–36.0)
MCV: 94 fL (ref 78.0–100.0)
Monocytes Absolute: 0.3 10*3/uL (ref 0.1–1.0)
Monocytes Relative: 7.5 % (ref 3.0–12.0)
Neutro Abs: 1.6 10*3/uL (ref 1.4–7.7)
Neutrophils Relative %: 46.3 % (ref 43.0–77.0)
Platelets: 262 10*3/uL (ref 150.0–400.0)
RBC: 4.56 Mil/uL (ref 4.22–5.81)
RDW: 12.7 % (ref 11.5–15.5)
WBC: 3.4 10*3/uL — ABNORMAL LOW (ref 4.0–10.5)

## 2023-02-04 LAB — BASIC METABOLIC PANEL
BUN: 11 mg/dL (ref 6–23)
CO2: 29 meq/L (ref 19–32)
Calcium: 9.6 mg/dL (ref 8.4–10.5)
Chloride: 102 meq/L (ref 96–112)
Creatinine, Ser: 1.01 mg/dL (ref 0.40–1.50)
GFR: 84.71 mL/min (ref >60.00)
Glucose, Bld: 104 mg/dL — ABNORMAL HIGH (ref 70–99)
Potassium: 4.7 meq/L (ref 3.5–5.1)
Sodium: 140 meq/L (ref 135–145)

## 2023-02-04 LAB — HEPATIC FUNCTION PANEL
ALT: 16 U/L (ref 0–53)
AST: 25 U/L (ref 0–37)
Albumin: 4.2 g/dL (ref 3.5–5.2)
Alkaline Phosphatase: 55 U/L (ref 39–117)
Bilirubin, Direct: 0.1 mg/dL (ref 0.0–0.3)
Total Bilirubin: 0.6 mg/dL (ref 0.2–1.2)
Total Protein: 7.7 g/dL (ref 6.0–8.3)

## 2023-02-04 LAB — HEMOGLOBIN A1C: Hgb A1c MFr Bld: 5.8 % (ref 4.6–6.5)

## 2023-02-04 LAB — TSH: TSH: 2.36 u[IU]/mL (ref 0.35–5.50)

## 2023-02-04 LAB — PSA: PSA: 0.47 ng/mL (ref 0.10–4.00)

## 2023-02-04 MED ORDER — UBRELVY 100 MG PO TABS
1.0000 | ORAL_TABLET | Freq: Once | ORAL | Status: DC | PRN
Start: 2023-02-04 — End: 2023-09-24

## 2023-02-04 NOTE — Progress Notes (Signed)
Subjective:  Patient ID: Darren Best, male    DOB: 15-Mar-1971  Age: 52 y.o. MRN: [id number]  CC: Annual Exam, Headache, Hypertension, and Hyperlipidemia   HPI SLYVESTER LATONA presents for a CPX and f/up ---  Discussed the use of AI scribe software for clinical note transcription with the patient, who gave verbal consent to proceed.  History of Present Illness   The patient, with a history of migraines, reports experiencing these headaches approximately once a month. The migraines are often preceded by visual disturbances, described as an aura, which can affect his peripheral vision. The patient denies any associated nausea, vomiting, numbness, weakness, tingling, slurred speech, or trouble walking or talking. The headaches are typically treated with Excedrin and rest, but the patient expressed interest in exploring other treatment options if covered by his new insurance.  The patient maintains an active lifestyle, including regular running (up to 3-4 miles), and reports good endurance with no associated chest pain, shortness of breath, or dizziness.       Outpatient Medications Prior to Visit  Medication Sig Dispense Refill   famotidine (PEPCID) 20 MG tablet Take 20 mg by mouth daily as needed for heartburn or indigestion.      fexofenadine (ALLEGRA) 180 MG tablet Take 180 mg by mouth daily.      fluticasone (FLONASE) 50 MCG/ACT nasal spray Place 2 sprays into both nostrils daily. (Patient taking differently: Place 2 sprays into both nostrils as needed for allergies.) 16 g 0   meloxicam (MOBIC) 15 MG tablet Take 1 tablet (15 mg total) by mouth daily. (Patient taking differently: Take 15 mg by mouth as needed.) 90 tablet 0   omeprazole (PRILOSEC) 20 MG capsule Take 1 capsule (20 mg total) by mouth daily. 90 capsule 3   No facility-administered medications prior to visit.    ROS Review of Systems  Constitutional:  Negative for diaphoresis and fatigue.  HENT: Negative.    Eyes:   Positive for visual disturbance.  Respiratory:  Negative for cough, chest tightness, shortness of breath and wheezing.   Cardiovascular:  Negative for chest pain, palpitations and leg swelling.  Gastrointestinal: Negative.  Negative for abdominal pain, blood in stool and nausea.  Endocrine: Negative.   Genitourinary: Negative.  Negative for difficulty urinating and dysuria.  Musculoskeletal: Negative.   Skin: Negative.   Neurological:  Positive for headaches. Negative for dizziness, syncope, speech difficulty, weakness, light-headedness and numbness.  Hematological:  Negative for adenopathy. Does not bruise/bleed easily.  Psychiatric/Behavioral: Negative.      Objective:  BP 124/82 (BP Location: Left Arm, Patient Position: Sitting, Cuff Size: Normal)   Pulse (!) 51   Temp 97.9 F (36.6 C) (Oral)   Resp 16   Ht 5\' 11"  (1.803 m)   Wt 186 lb 3.2 oz (84.5 kg)   SpO2 96%   BMI 25.97 kg/m   BP Readings from Last 3 Encounters:  02/04/23 124/82  09/18/22 108/70  07/31/22 118/72    Wt Readings from Last 3 Encounters:  02/04/23 186 lb 3.2 oz (84.5 kg)  09/18/22 183 lb 8 oz (83.2 kg)  07/31/22 184 lb (83.5 kg)    Physical Exam Vitals reviewed.  HENT:     Nose: Nose normal.     Mouth/Throat:     Mouth: Mucous membranes are moist.  Eyes:     General: No scleral icterus.    Conjunctiva/sclera: Conjunctivae normal.  Cardiovascular:     Rate and Rhythm: Regular rhythm. Bradycardia present.  Heart sounds: No murmur heard.    No friction rub. No gallop.     Comments: EKG- SB, 46 bpm No LVH, Q waves, or ST/T waves  Pulmonary:     Effort: Pulmonary effort is normal.     Breath sounds: No stridor. No wheezing, rhonchi or rales.  Abdominal:     General: Abdomen is flat.     Palpations: There is no mass.     Tenderness: There is no abdominal tenderness. There is no guarding.     Hernia: No hernia is present. There is no hernia in the left inguinal area or right inguinal area.   Genitourinary:    Pubic Area: No rash.      Penis: Normal and circumcised.      Testes: Normal.     Epididymis:     Right: Normal.     Left: Normal.     Prostate: Normal. Not enlarged, not tender and no nodules present.     Rectum: Guaiac result negative. Internal hemorrhoid present. No mass, tenderness, anal fissure or external hemorrhoid. Normal anal tone.  Musculoskeletal:     Cervical back: Neck supple.     Right lower leg: No edema.     Left lower leg: No edema.  Lymphadenopathy:     Cervical: No cervical adenopathy.     Lower Body: No right inguinal adenopathy. No left inguinal adenopathy.  Neurological:     General: No focal deficit present.     Mental Status: He is alert and oriented to person, place, and time.  Psychiatric:        Mood and Affect: Mood normal.        Behavior: Behavior normal.     Lab Results  Component Value Date   WBC 3.4 (L) 02/04/2023   HGB 14.2 02/04/2023   HCT 42.8 02/04/2023   PLT 262.0 02/04/2023   GLUCOSE 104 (H) 02/04/2023   CHOL 192 02/04/2023   TRIG 100.0 02/04/2023   HDL 65.60 02/04/2023   LDLCALC 107 (H) 02/04/2023   ALT 16 02/04/2023   AST 25 02/04/2023   NA 140 02/04/2023   K 4.7 02/04/2023   CL 102 02/04/2023   CREATININE 1.01 02/04/2023   BUN 11 02/04/2023   CO2 29 02/04/2023   TSH 2.36 02/04/2023   PSA 0.47 02/04/2023   HGBA1C 5.8 02/04/2023    No results found.  Assessment & Plan:  Prediabetes -     Basic metabolic panel; Future -     Hemoglobin A1c; Future  Ocular migraine with status migrainosus, not intractable -     Bernita Raisin; Take 1 tablet (100 mg total) by mouth once as needed for up to 1 dose.  Eosinophilic esophagitis -     CBC with Differential/Platelet; Future  Bradycardia- He is asx with this. -     TSH; Future -     EKG 12-Lead  Dyslipidemia, goal LDL below 130- Statin is not indicated. -     Lipid panel; Future -     Hepatic function panel; Future  Routine general medical examination at a  health care facility- Exam completed, labs reviewed, vaccines reviewed, cancer screenings addressed, pt ed material was given.  -     PSA; Future     Follow-up: Return in about 6 months (around 08/05/2023).  Sanda Linger, MD

## 2023-02-04 NOTE — Patient Instructions (Signed)
Health Maintenance, Male Adopting a healthy lifestyle and getting preventive care are important in promoting health and wellness. Ask your health care provider about: The right schedule for you to have regular tests and exams. Things you can do on your own to prevent diseases and keep yourself healthy. What should I know about diet, weight, and exercise? Eat a healthy diet  Eat a diet that includes plenty of vegetables, fruits, low-fat dairy products, and lean protein. Do not eat a lot of foods that are high in solid fats, added sugars, or sodium. Maintain a healthy weight Body mass index (BMI) is a measurement that can be used to identify possible weight problems. It estimates body fat based on height and weight. Your health care provider can help determine your BMI and help you achieve or maintain a healthy weight. Get regular exercise Get regular exercise. This is one of the most important things you can do for your health. Most adults should: Exercise for at least 150 minutes each week. The exercise should increase your heart rate and make you sweat (moderate-intensity exercise). Do strengthening exercises at least twice a week. This is in addition to the moderate-intensity exercise. Spend less time sitting. Even light physical activity can be beneficial. Watch cholesterol and blood lipids Have your blood tested for lipids and cholesterol at 54 years of age, then have this test every 5 years. You may need to have your cholesterol levels checked more often if: Your lipid or cholesterol levels are high. You are older than 54 years of age. You are at high risk for heart disease. What should I know about cancer screening? Many types of cancers can be detected early and may often be prevented. Depending on your health history and family history, you may need to have cancer screening at various ages. This may include screening for: Colorectal cancer. Prostate cancer. Skin cancer. Lung  cancer. What should I know about heart disease, diabetes, and high blood pressure? Blood pressure and heart disease High blood pressure causes heart disease and increases the risk of stroke. This is more likely to develop in people who have high blood pressure readings or are overweight. Talk with your health care provider about your target blood pressure readings. Have your blood pressure checked: Every 3-5 years if you are 18-39 years of age. Every year if you are 40 years old or older. If you are between the ages of 65 and 75 and are a current or former smoker, ask your health care provider if you should have a one-time screening for abdominal aortic aneurysm (AAA). Diabetes Have regular diabetes screenings. This checks your fasting blood sugar level. Have the screening done: Once every three years after age 45 if you are at a normal weight and have a low risk for diabetes. More often and at a younger age if you are overweight or have a high risk for diabetes. What should I know about preventing infection? Hepatitis B If you have a higher risk for hepatitis B, you should be screened for this virus. Talk with your health care provider to find out if you are at risk for hepatitis B infection. Hepatitis C Blood testing is recommended for: Everyone born from 1945 through 1965. Anyone with known risk factors for hepatitis C. Sexually transmitted infections (STIs) You should be screened each year for STIs, including gonorrhea and chlamydia, if: You are sexually active and are younger than 54 years of age. You are older than 54 years of age and your   health care provider tells you that you are at risk for this type of infection. Your sexual activity has changed since you were last screened, and you are at increased risk for chlamydia or gonorrhea. Ask your health care provider if you are at risk. Ask your health care provider about whether you are at high risk for HIV. Your health care provider  may recommend a prescription medicine to help prevent HIV infection. If you choose to take medicine to prevent HIV, you should first get tested for HIV. You should then be tested every 3 months for as long as you are taking the medicine. Follow these instructions at home: Alcohol use Do not drink alcohol if your health care provider tells you not to drink. If you drink alcohol: Limit how much you have to 0-2 drinks a day. Know how much alcohol is in your drink. In the [place] one drink equals one 12 oz bottle of beer (355 mL), one 5 oz glass of wine (148 mL), or one 1 oz glass of hard liquor (44 mL). Lifestyle Do not use any products that contain nicotine or tobacco. These products include cigarettes, chewing tobacco, and vaping devices, such as e-cigarettes. If you need help quitting, ask your health care provider. Do not use street drugs. Do not share needles. Ask your health care provider for help if you need support or information about quitting drugs. General instructions Schedule regular health, dental, and eye exams. Stay current with your vaccines. Tell your health care provider if: You often feel depressed. You have ever been abused or do not feel safe at home. Summary Adopting a healthy lifestyle and getting preventive care are important in promoting health and wellness. Follow your health care provider's instructions about healthy diet, exercising, and getting tested or screened for diseases. Follow your health care provider's instructions on monitoring your cholesterol and blood pressure. This information is not intended to replace advice given to you by your health care provider. Make sure you discuss any questions you have with your health care provider. Document Revised: 08/13/2020 Document Reviewed: 08/13/2020 Elsevier Patient Education  2024 Elsevier Inc.  

## 2023-04-14 ENCOUNTER — Telehealth: Payer: Managed Care, Other (non HMO) | Admitting: Physician Assistant

## 2023-04-14 ENCOUNTER — Other Ambulatory Visit (HOSPITAL_COMMUNITY): Payer: Self-pay

## 2023-04-14 DIAGNOSIS — J101 Influenza due to other identified influenza virus with other respiratory manifestations: Secondary | ICD-10-CM | POA: Diagnosis not present

## 2023-04-14 DIAGNOSIS — R110 Nausea: Secondary | ICD-10-CM | POA: Diagnosis not present

## 2023-04-14 MED ORDER — OSELTAMIVIR PHOSPHATE 75 MG PO CAPS
75.0000 mg | ORAL_CAPSULE | Freq: Two times a day (BID) | ORAL | 0 refills | Status: DC
  Filled 2023-04-14: qty 10, 5d supply, fill #0

## 2023-04-14 MED ORDER — ONDANSETRON 4 MG PO TBDP
4.0000 mg | ORAL_TABLET | Freq: Three times a day (TID) | ORAL | 0 refills | Status: AC | PRN
  Filled 2023-04-14: qty 9, 30d supply, fill #0

## 2023-04-14 NOTE — Patient Instructions (Signed)
 [name], thank you for joining Darren [name], PA-C for today's virtual visit.  While this provider is not your primary care provider (PCP), if your PCP is located in our provider database this encounter information will be shared with them immediately following your visit.   A [facility] MyChart account gives you access to today's visit and all your visits, tests, and labs performed at [facility]  click here if you don't have a [facility] MyChart account or go to [email or web address]  Consent: (Patient) Darren Best provided verbal consent for this virtual visit at the beginning of the encounter.  Current Medications:  Current Outpatient Medications:    ondansetron  (ZOFRAN -ODT) 4 MG disintegrating tablet, Dissolve 1 tablet (4 mg total) by mouth every 8 (eight) hours as needed., Disp: 20 tablet, Rfl: 0   oseltamivir  (TAMIFLU ) 75 MG capsule, Take 1 capsule (75 mg total) by mouth 2 (two) times daily., Disp: 10 capsule, Rfl: 0   famotidine (PEPCID) 20 MG tablet, Take 20 mg by mouth daily as needed for heartburn or indigestion. , Disp: , Rfl:    fexofenadine  (ALLEGRA ) 180 MG tablet, Take 180 mg by mouth daily. , Disp: , Rfl:    fluticasone  (FLONASE ) 50 MCG/ACT nasal spray, Place 2 sprays into both nostrils daily. (Patient taking differently: Place 2 sprays into both nostrils as needed for allergies.), Disp: 16 g, Rfl: 0   meloxicam  (MOBIC ) 15 MG tablet, Take 1 tablet (15 mg total) by mouth daily. (Patient taking differently: Take 15 mg by mouth as needed.), Disp: 90 tablet, Rfl: 0   Ubrogepant  (UBRELVY ) 100 MG TABS, Take 1 tablet (100 mg total) by mouth once as needed for up to 1 dose., Disp: , Rfl:    Medications ordered in this encounter:  Meds ordered this encounter  Medications   oseltamivir  (TAMIFLU ) 75 MG capsule    Sig: Take 1 capsule (75 mg total) by mouth 2 (two) times daily.    Dispense:  10 capsule    Refill:  0    Supervising Provider:   LAMPTEY,  PHILIP O [[id number]]   ondansetron  (ZOFRAN -ODT) 4 MG disintegrating tablet    Sig: Dissolve 1 tablet (4 mg total) by mouth every 8 (eight) hours as needed.    Dispense:  20 tablet    Refill:  0    Supervising Provider:   LAMPTEY, PHILIP O [[id number]]     *If you need refills on other medications prior to your next appointment, please contact your pharmacy*  Follow-Up: Call back or seek an in-person evaluation if the symptoms worsen or if the condition fails to improve as anticipated.  [facility] [phone]  Other Instructions Influenza, Adult Influenza is also called the flu. It's an infection that affects your respiratory tract. This includes your nose, throat, windpipe, and lungs. The flu is contagious. This means it spreads easily from person to person. It causes symptoms that are like a cold. It can also cause a high fever and body aches. What are the causes? The flu is caused by the influenza virus. You can get it by: Breathing in droplets that are in the air after an infected person coughs or sneezes. Touching something that has the virus on it and then touching your mouth, nose, or eyes. What increases the risk? You may be more likely to get the flu if: You don't wash your hands often. You're near a lot of people during cold and flu season. You touch your mouth,  eyes, or nose without washing your hands first. You don't get a flu shot each year. You may also be more at risk for the flu and serious problems, such as a lung infection called pneumonia, if: You're older than 65. You're pregnant. Your immune system is weak. Your immune system is your body's defense system. You have a long-term, or chronic, condition, such as: Heart, kidney, or lung disease. Diabetes. A liver disorder. Asthma. You're very overweight. You have anemia. This is when you don't have enough red blood cells in your body. What are the signs or symptoms? Flu symptoms often start all of a  sudden. They may last 4-14 days and include: Fever and chills. Headaches, body aches, or muscle aches. Sore throat. Cough. Runny or stuffy nose. Discomfort in your chest. Not wanting to eat as much as normal. Feeling weak or tired. Feeling dizzy. Nausea or vomiting. How is this diagnosed? The flu may be diagnosed based on your symptoms and medical history. You may also have a physical exam. A swab may be taken from your nose or throat and tested for the virus. How is this treated? If the flu is found early, you can be treated with antiviral medicine. This may be given to you by mouth or through an IV. It can help you feel less sick and get better faster. Taking care of yourself at home can also help your symptoms get better. Your health care provider may tell you to: Take over-the-counter medicines. Drink lots of fluids. The flu often goes away on its own. If you have very bad symptoms or problems caused by the flu, you may need to be treated in a hospital. Follow these instructions at home: Activity Rest as needed. Get lots of sleep. Stay home from work or school as told by your provider. Leave home only to go see your provider. Do not leave home for other reasons until you don't have a fever for 24 hours without taking medicine. Eating and drinking Take an oral rehydration solution (ORS). This is a drink that is sold at pharmacies and stores. Drink enough fluid to keep your pee pale yellow. Try to drink small amounts of clear fluids. These include water, ice chips, fruit juice mixed with water, and low-calorie sports drinks. Try to eat bland foods that are easy to digest. These include bananas, applesauce, rice, lean meats, toast, and crackers. Avoid drinks that have a lot of sugar or caffeine in them. These include energy drinks, regular sports drinks, and soda. Do not drink alcohol. Do not eat spicy or fatty foods. General instructions     Take your medicines only as told by  your provider. Use a cool mist humidifier to add moisture to the air in your home. This can make it easier for you to breathe. You should also clean the humidifier every day. To do so: Empty the water. Pour clean water in. Cover your mouth and nose when you cough or sneeze. Wash your hands with soap and water often and for at least 20 seconds. It's extra important to do so after you cough or sneeze. If you can't use soap and water, use hand sanitizer. How is this prevented?  Get a flu shot every year. Ask your provider when you should get your flu shot. Stay away from people who are sick during fall and winter. Fall and winter are cold and flu season. Contact a health care provider if: You get new symptoms. You have chest pain. You have  watery poop, also called diarrhea. You have a fever. Your cough gets worse. You start to have more mucus. You feel like you may vomit, or you vomit. Get help right away if: You become short of breath or have trouble breathing. Your skin or nails turn blue. You have very bad pain or stiffness in your neck. You get a sudden headache or pain in your face or ear. You vomit each time you eat or drink. These symptoms may be an emergency. Call 911 right away. Do not wait to see if the symptoms will go away. Do not drive yourself to the hospital. This information is not intended to replace advice given to you by your health care provider. Make sure you discuss any questions you have with your health care provider. Document Revised: 05/01/2022 Document Reviewed: 05/01/2022 Elsevier Patient Education  2024 Elsevier Inc.    If you have been instructed to have an in-person evaluation today at a local Urgent Care facility, please use the link below. It will take you to a list of all of our available [facility], including address, phone number and hours of operation. Please do not delay care.  [facility]  If you or a family member do  not have a primary care provider, use the link below to schedule a visit and establish care. When you choose a [facility] primary care physician or advanced practice provider, you gain a long-term partner in health. Find a Primary Care Provider  Learn more about [facility]'s in-office and virtual care options:  - Get Care Now

## 2023-04-14 NOTE — Progress Notes (Signed)
 Virtual Visit Consent   Darren Best, you are scheduled for a virtual visit with a  provider today. Just as with appointments in the office, your consent must be obtained to participate. Your consent will be active for this visit and any virtual visit you may have with one of our providers in the next 365 days. If you have a MyChart account, a copy of this consent can be sent to you electronically.  As this is a virtual visit, video technology does not allow for your provider to perform a traditional examination. This may limit your provider's ability to fully assess your condition. If your provider identifies any concerns that need to be evaluated in person or the need to arrange testing (such as labs, EKG, etc.), we will make arrangements to do so. Although advances in technology are sophisticated, we cannot ensure that it will always work on either your end or our end. If the connection with a video visit is poor, the visit may have to be switched to a telephone visit. With either a video or telephone visit, we are not always able to ensure that we have a secure connection.  By engaging in this virtual visit, you consent to the provision of healthcare and authorize for your insurance to be billed (if applicable) for the services provided during this visit. Depending on your insurance coverage, you may receive a charge related to this service.  I need to obtain your verbal consent now. Are you willing to proceed with your visit today? Darren Best has provided verbal consent on 04/14/2023 for a virtual visit (video or telephone). Darren [name], PA-C  Date: 04/14/2023 11:01 AM  Virtual Visit via Video Note   I, Darren Best, connected with  Darren Best  ([id number], 07-Mar-1971) on 04/14/23 at 10:45 AM EST by a video-enabled telemedicine application and verified that I am speaking with the correct person using two identifiers.  Location: Patient: Virtual Visit Location  Patient: Home Provider: Virtual Visit Location Provider: Home Office   I discussed the limitations of evaluation and management by telemedicine and the availability of in person appointments. The patient expressed understanding and agreed to proceed.    History of Present Illness: Darren Best is a 53 y.o. who identifies as a male who was assigned male at birth, and is being seen today for flu symptoms after exposure.  HPI: URI  This is a new problem. The current episode started yesterday. The problem has been gradually worsening. There has been no fever. Associated symptoms include congestion, coughing (dry), headaches, nausea, rhinorrhea and sinus pain (left sided). Pertinent negatives include no diarrhea, ear pain, plugged ear sensation, sore throat, vomiting or wheezing. Associated symptoms comments: Body aches, chills. Treatments tried: Mucinex D, naprosyn , tylenol, saline sinus rinse (neilmed), flonase . The treatment provided no relief.   Tested positive for influenza A today on at home test Had exposure from wife  Problems:  Patient Active Problem List   Diagnosis Date Noted   Bradycardia 07/31/2022   Left lumbar radiculitis 01/29/2022   Ocular migraine with status migrainosus, not intractable 01/06/2022   Routine general medical examination at a health care facility 01/28/2021   Prediabetes 01/28/2021   Eosinophilic esophagitis 08/08/2015   IBS (irritable bowel syndrome) 05/15/2015    Allergies:  Allergies  Allergen Reactions   Other Diarrhea and Itching    tomato   Almond Oil Bitter Flavor [Flavoring Agent]    Milk-Related Compounds    Medications:  Current Outpatient Medications:    ondansetron  (ZOFRAN -ODT) 4 MG disintegrating tablet, Take 1 tablet (4 mg total) by mouth every 8 (eight) hours as needed., Disp: 20 tablet, Rfl: 0   oseltamivir  (TAMIFLU ) 75 MG capsule, Take 1 capsule (75 mg total) by mouth 2 (two) times daily., Disp: 10 capsule, Rfl: 0   famotidine (PEPCID)  20 MG tablet, Take 20 mg by mouth daily as needed for heartburn or indigestion. , Disp: , Rfl:    fexofenadine  (ALLEGRA ) 180 MG tablet, Take 180 mg by mouth daily. , Disp: , Rfl:    fluticasone  (FLONASE ) 50 MCG/ACT nasal spray, Place 2 sprays into both nostrils daily. (Patient taking differently: Place 2 sprays into both nostrils as needed for allergies.), Disp: 16 g, Rfl: 0   meloxicam  (MOBIC ) 15 MG tablet, Take 1 tablet (15 mg total) by mouth daily. (Patient taking differently: Take 15 mg by mouth as needed.), Disp: 90 tablet, Rfl: 0   Ubrogepant  (UBRELVY ) 100 MG TABS, Take 1 tablet (100 mg total) by mouth once as needed for up to 1 dose., Disp: , Rfl:   Observations/Objective: Patient is well-developed, well-nourished in no acute distress.  Resting comfortably at home.  Head is normocephalic, atraumatic.  No labored breathing.  Speech is clear and coherent with logical content.  Patient is alert and oriented at baseline.    Assessment and Plan: 1. Influenza A (Primary) - oseltamivir  (TAMIFLU ) 75 MG capsule; Take 1 capsule (75 mg total) by mouth 2 (two) times daily.  Dispense: 10 capsule; Refill: 0 - ondansetron  (ZOFRAN -ODT) 4 MG disintegrating tablet; Take 1 tablet (4 mg total) by mouth every 8 (eight) hours as needed.  Dispense: 20 tablet; Refill: 0  2. Nausea - ondansetron  (ZOFRAN -ODT) 4 MG disintegrating tablet; Take 1 tablet (4 mg total) by mouth every 8 (eight) hours as needed.  Dispense: 20 tablet; Refill: 0  - Suspect influenza due to symptoms and positive exposure - Tamiflu  prescribed - Zofran  for nausea - Continue OTC medication of choice for symptomatic management - Push fluids - Rest - Seek in person evaluation if symptoms worsen or fail to improve   Follow Up Instructions: I discussed the assessment and treatment plan with the patient. The patient was provided an opportunity to ask questions and all were answered. The patient agreed with the plan and demonstrated an  understanding of the instructions.  A copy of instructions were sent to the patient via MyChart unless otherwise noted below.    The patient was advised to call back or seek an in-person evaluation if the symptoms worsen or if the condition fails to improve as anticipated.    Darren [name], PA-C

## 2023-09-08 ENCOUNTER — Encounter: Payer: Self-pay | Admitting: Internal Medicine

## 2023-09-24 ENCOUNTER — Encounter: Payer: Self-pay | Admitting: Internal Medicine

## 2023-09-24 ENCOUNTER — Other Ambulatory Visit (HOSPITAL_COMMUNITY): Payer: Self-pay

## 2023-09-24 ENCOUNTER — Ambulatory Visit: Admitting: Internal Medicine

## 2023-09-24 VITALS — BP 130/82 | HR 70 | Temp 98.2°F | Resp 16 | Ht 71.0 in | Wt 191.6 lb

## 2023-09-24 DIAGNOSIS — G43E09 Chronic migraine with aura, not intractable, without status migrainosus: Secondary | ICD-10-CM | POA: Diagnosis not present

## 2023-09-24 MED ORDER — NURTEC 75 MG PO TBDP
1.0000 | ORAL_TABLET | ORAL | Status: DC | PRN
Start: 2023-09-24 — End: 2023-10-07

## 2023-09-24 MED ORDER — NURTEC 75 MG PO TBDP
1.0000 | ORAL_TABLET | ORAL | 1 refills | Status: DC | PRN
  Filled 2023-09-24: qty 8, 16d supply, fill #0

## 2023-09-24 MED ORDER — PYRIDOXINE HCL 25 MG PO TABS: 25.0000 mg | ORAL_TABLET | Freq: Every day | ORAL | 1 refills | Status: AC

## 2023-09-24 NOTE — Patient Instructions (Signed)

## 2023-09-24 NOTE — Progress Notes (Signed)
 Subjective:  Patient ID: REFORD OLLIFF, male    DOB: Feb 14, 1971  Age: 53 y.o. MRN: [id number]  CC: Migraine (Patient states that he's having migraines at least once every two weeks and sometimes twice in 1 week. )   HPI MAXI RODAS presents for f/up ----  Discussed the use of AI scribe software for clinical note transcription with the patient, who gave verbal consent to proceed.  History of Present Illness   Asani Mcburney is a 53 year old male who presents with frequent migraines.  He experiences migraines approximately every couple of weeks, with a particularly severe episode occurring after a vacation a couple of months ago, where he had three migraines in one week. He has tried Ubrelvy  twice, which left him feeling fatigued and unfunctional for the rest of the afternoon. In contrast, Aleve  and resting for 15 to 30 minutes usually allow him to resume activities.  His migraines sometimes start with visual disturbances or aura, which can either resolve without significant pain or lead to a dull throbbing headache. Stress is a significant factor, with a high-stress job and family environment contributing to frequent awakenings at night due to stress-related thoughts, impacting sleep quality.  He maintains an active lifestyle, including running and gym workouts, without experiencing chest pain, shortness of breath, dizziness, or lightheadedness. He has a history of a back injury that previously affected his blood pressure, but he reports that his blood pressure is generally fine when checked at home.  He has started taking magnesium  supplements but not B6. No nausea, vomiting, changes in vision or hearing, slurred speech, numbness, weakness, or tingling associated with his migraines. He rarely wakes up with a headache, and they typically occur later in the day.       Outpatient Medications Prior to Visit  Medication Sig Dispense Refill   famotidine (PEPCID) 20 MG tablet Take 20 mg  by mouth daily as needed for heartburn or indigestion.      fexofenadine  (ALLEGRA ) 180 MG tablet Take 180 mg by mouth daily.      fluticasone  (FLONASE ) 50 MCG/ACT nasal spray Place 2 sprays into both nostrils daily. (Patient taking differently: Place 2 sprays into both nostrils as needed for allergies.) 16 g 0   meloxicam  (MOBIC ) 15 MG tablet Take 1 tablet (15 mg total) by mouth daily. (Patient taking differently: Take 15 mg by mouth as needed.) 90 tablet 0   ondansetron  (ZOFRAN -ODT) 4 MG disintegrating tablet Dissolve 1 tablet (4 mg total) by mouth every 8 (eight) hours as needed. 20 tablet 0   oseltamivir  (TAMIFLU ) 75 MG capsule Take 1 capsule (75 mg total) by mouth 2 (two) times daily. 10 capsule 0   Ubrogepant  (UBRELVY ) 100 MG TABS Take 1 tablet (100 mg total) by mouth once as needed for up to 1 dose.     No facility-administered medications prior to visit.    ROS Review of Systems  Constitutional:  Negative for appetite change, chills, diaphoresis, fatigue and fever.  HENT: Negative.    Eyes:  Negative for visual disturbance.  Respiratory:  Negative for chest tightness, shortness of breath and wheezing.   Cardiovascular:  Negative for chest pain, palpitations and leg swelling.  Gastrointestinal:  Negative for abdominal pain, constipation, diarrhea, nausea and vomiting.  Genitourinary:  Negative for difficulty urinating.  Musculoskeletal: Negative.   Skin: Negative.   Neurological:  Positive for headaches. Negative for dizziness and weakness.  Hematological:  Negative for adenopathy. Does not bruise/bleed easily.  Psychiatric/Behavioral: Negative.      Objective:  BP 130/82 (BP Location: Left Arm, Patient Position: Sitting, Cuff Size: Normal)   Pulse 70   Temp 98.2 F (36.8 C) (Oral)   Resp 16   Ht 5' 11 (1.803 m)   Wt 191 lb 9.6 oz (86.9 kg)   SpO2 98%   BMI 26.72 kg/m   BP Readings from Last 3 Encounters:  09/24/23 130/82  02/04/23 124/82  09/18/22 108/70    Wt  Readings from Last 3 Encounters:  09/24/23 191 lb 9.6 oz (86.9 kg)  02/04/23 186 lb 3.2 oz (84.5 kg)  09/18/22 183 lb 8 oz (83.2 kg)    Physical Exam HENT:     Nose: Nose normal.     Mouth/Throat:     Mouth: Mucous membranes are moist.   Eyes:     General: No scleral icterus.    Extraocular Movements: Extraocular movements intact.     Right eye: Normal extraocular motion.     Left eye: Normal extraocular motion.    Cardiovascular:     Rate and Rhythm: Normal rate and regular rhythm.     Heart sounds: No murmur heard.    No friction rub. No gallop.  Pulmonary:     Effort: Pulmonary effort is normal.     Breath sounds: No stridor. No wheezing, rhonchi or rales.  Abdominal:     General: Abdomen is flat.     Palpations: There is no mass.     Tenderness: There is no abdominal tenderness. There is no guarding.     Hernia: No hernia is present.   Musculoskeletal:        General: Normal range of motion.     Cervical back: Neck supple.     Right lower leg: No edema.     Left lower leg: No edema.  Lymphadenopathy:     Cervical: No cervical adenopathy.   Skin:    General: Skin is warm and dry.   Neurological:     General: No focal deficit present.     Mental Status: He is alert and oriented to person, place, and time. Mental status is at baseline.     Cranial Nerves: Cranial nerves 2-12 are intact. No cranial nerve deficit.     Sensory: Sensation is intact. No sensory deficit.     Motor: Motor function is intact. No weakness.     Coordination: Coordination is intact. Coordination normal.     Gait: Gait is intact. Gait normal.     Deep Tendon Reflexes: Reflexes normal. Babinski sign absent on the right side. Babinski sign absent on the left side.     Reflex Scores:      Tricep reflexes are 1+ on the right side.      Bicep reflexes are 1+ on the right side and 1+ on the left side.      Brachioradialis reflexes are 1+ on the right side and 1+ on the left side.      Patellar  reflexes are 1+ on the right side and 1+ on the left side.      Achilles reflexes are 1+ on the right side and 1+ on the left side.  Psychiatric:        Mood and Affect: Mood normal.        Behavior: Behavior normal.     Lab Results  Component Value Date   WBC 3.4 (L) 02/04/2023   HGB 14.2 02/04/2023   HCT 42.8 02/04/2023  PLT 262.0 02/04/2023   GLUCOSE 104 (H) 02/04/2023   CHOL 192 02/04/2023   TRIG 100.0 02/04/2023   HDL 65.60 02/04/2023   LDLCALC 107 (H) 02/04/2023   ALT 16 02/04/2023   AST 25 02/04/2023   NA 140 02/04/2023   K 4.7 02/04/2023   CL 102 02/04/2023   CREATININE 1.01 02/04/2023   BUN 11 02/04/2023   CO2 29 02/04/2023   TSH 2.36 02/04/2023   PSA 0.47 02/04/2023   HGBA1C 5.8 02/04/2023    No results found.  Assessment & Plan:   Chronic migraine with aura without status migrainosus, not intractable- Will treat with a CGRP- antagonist. -     Nurtec; Take 1 tablet (75 mg total) by mouth every other day as needed. -     Pyridoxine HCl; Take 1 tablet (25 mg total) by mouth daily.  Dispense: 90 tablet; Refill: 1 -     Nurtec; Take 1 tablet (75 mg total) by mouth every other day as needed.  Dispense: 46 tablet; Refill: 1     Follow-up: Return in about 6 months (around 03/25/2024).  Sandra Crouch, MD

## 2023-09-25 ENCOUNTER — Telehealth: Payer: Self-pay

## 2023-09-25 ENCOUNTER — Other Ambulatory Visit (HOSPITAL_COMMUNITY): Payer: Self-pay

## 2023-09-25 NOTE — Telephone Encounter (Signed)
 Pharmacy Patient Advocate Encounter   Received notification from CoverMyMeds that prior authorization for Nurtec 75 is required/requested.   Insurance verification completed.   The patient is insured through [employer] .   Per test claim: PA required; PA submitted to above mentioned insurance via CoverMyMeds Key/confirmation #/EOC [id number] Status is pending

## 2023-09-28 ENCOUNTER — Other Ambulatory Visit (HOSPITAL_COMMUNITY): Payer: Self-pay

## 2023-09-29 ENCOUNTER — Ambulatory Visit: Admitting: Internal Medicine

## 2023-09-30 NOTE — Telephone Encounter (Signed)
 Pharmacy Patient Advocate Encounter  Received notification from [employer] that Prior Authorization for Nurtec 75 has been DENIED.  Full denial letter will be uploaded to the media tab. See denial reason below.   PA #/Case ID/Reference #: [id number]

## 2023-10-02 NOTE — Telephone Encounter (Signed)
 Patient has been made aware.

## 2023-10-05 ENCOUNTER — Encounter: Payer: Self-pay | Admitting: Internal Medicine

## 2023-10-07 ENCOUNTER — Other Ambulatory Visit: Payer: Self-pay | Admitting: Internal Medicine

## 2023-10-07 ENCOUNTER — Other Ambulatory Visit (HOSPITAL_COMMUNITY): Payer: Self-pay

## 2023-10-07 DIAGNOSIS — G43E09 Chronic migraine with aura, not intractable, without status migrainosus: Secondary | ICD-10-CM

## 2023-10-07 MED ORDER — ELETRIPTAN HYDROBROMIDE 40 MG PO TABS
40.0000 mg | ORAL_TABLET | ORAL | 5 refills | Status: AC | PRN
  Filled 2023-10-07: qty 6, 30d supply, fill #0

## 2023-10-08 ENCOUNTER — Other Ambulatory Visit (HOSPITAL_COMMUNITY): Payer: Self-pay

## 2023-10-27 ENCOUNTER — Encounter: Payer: Self-pay | Admitting: Internal Medicine

## 2023-10-30 ENCOUNTER — Other Ambulatory Visit: Payer: Self-pay | Admitting: Internal Medicine

## 2023-10-30 DIAGNOSIS — G43E09 Chronic migraine with aura, not intractable, without status migrainosus: Secondary | ICD-10-CM

## 2023-11-13 ENCOUNTER — Other Ambulatory Visit (HOSPITAL_COMMUNITY): Payer: Self-pay

## 2023-11-13 ENCOUNTER — Other Ambulatory Visit: Payer: Self-pay

## 2023-11-13 MED ORDER — NORTRIPTYLINE HCL 10 MG PO CAPS
10.0000 mg | ORAL_CAPSULE | Freq: Every day | ORAL | 3 refills | Status: AC
  Filled 2023-11-13: qty 30, 30d supply, fill #0
  Filled 2023-12-12: qty 90, 90d supply, fill #1

## 2023-11-13 MED ORDER — SUMATRIPTAN SUCCINATE 100 MG PO TABS
100.0000 mg | ORAL_TABLET | ORAL | 6 refills | Status: AC | PRN
  Filled 2023-11-13: qty 9, 13d supply, fill #0
  Filled 2023-11-13: qty 9, 12d supply, fill #0
  Filled 2024-01-12: qty 9, 13d supply, fill #1
  Filled 2024-02-08: qty 9, 13d supply, fill #2
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
## 2023-11-16 ENCOUNTER — Other Ambulatory Visit (HOSPITAL_COMMUNITY): Payer: Self-pay | Admitting: Neurology

## 2023-11-16 DIAGNOSIS — R519 Headache, unspecified: Secondary | ICD-10-CM

## 2023-11-16 DIAGNOSIS — H539 Unspecified visual disturbance: Secondary | ICD-10-CM

## 2023-11-19 NOTE — Result Encounter Note (Signed)
 Patient has significantly low vit B12 level. Low Vit B12 has been associated with multiple neurological dysfunction. Patient should get - Vit B12 1000 mcg IM once a week for 4 weeks then once a month for 4 months. During and after loading dose with injection treatment patient should also take 1000 mcg by mouth once a day. Patient should get Vit B12 level done after 3 months.  Patient should increase by or start supplementing with 1000 units of Vitamin D3 once a day. Take it after a major meal. Patient should get Vitamin D checked in 3 months to make sure levels have improved.

## 2023-11-20 ENCOUNTER — Other Ambulatory Visit (HOSPITAL_COMMUNITY): Payer: Self-pay

## 2023-11-20 ENCOUNTER — Ambulatory Visit (HOSPITAL_COMMUNITY)
Admission: RE | Admit: 2023-11-20 | Discharge: 2023-11-20 | Disposition: A | Discharge status: Home / Self Care | Source: Ambulatory Visit | Attending: Neurology | Admitting: Neurology

## 2023-11-20 DIAGNOSIS — H539 Unspecified visual disturbance: Secondary | ICD-10-CM | POA: Diagnosis present

## 2023-11-20 DIAGNOSIS — R519 Headache, unspecified: Secondary | ICD-10-CM

## 2023-11-20 MED ORDER — GADOBUTROL 1 MMOL/ML IV SOLN
8.5000 mL | Freq: Once | INTRAVENOUS | Status: AC | PRN
  Administered 2023-11-20: 8.5 mL via INTRAVENOUS

## 2023-11-20 MED ORDER — BD LUER-LOK SYRINGE 25G X 1" 3 ML MISC
0 refills | Status: AC
  Filled 2023-11-20: qty 8, 148d supply, fill #0

## 2023-11-20 MED ORDER — CYANOCOBALAMIN 1000 MCG/ML IJ SOLN
1000.0000 ug | INTRAMUSCULAR | 0 refills | Status: DC
  Filled 2023-11-20: qty 8, 168d supply, fill #0

## 2023-11-20 MED ORDER — CYANOCOBALAMIN 1000 MCG/ML IJ SOLN
INTRAMUSCULAR | 0 refills | Status: AC
  Filled 2023-11-20: qty 6, 88d supply, fill #0
  Filled 2023-12-12 – 2023-12-15 (×2): qty 6, 60d supply, fill #1
  Filled 2023-12-16: qty 2, 60d supply, fill #1
  Filled 2024-01-12: qty 6, 60d supply, fill #1
  Filled 2024-02-08: qty 2, 60d supply, fill #1

## 2023-12-02 ENCOUNTER — Encounter: Payer: Self-pay | Admitting: Gastroenterology

## 2023-12-13 ENCOUNTER — Other Ambulatory Visit (HOSPITAL_COMMUNITY): Payer: Self-pay

## 2023-12-14 ENCOUNTER — Other Ambulatory Visit: Payer: Self-pay

## 2023-12-15 ENCOUNTER — Other Ambulatory Visit (HOSPITAL_COMMUNITY): Payer: Self-pay

## 2023-12-16 ENCOUNTER — Other Ambulatory Visit (HOSPITAL_COMMUNITY): Payer: Self-pay

## 2023-12-22 ENCOUNTER — Encounter: Payer: Self-pay | Admitting: Gastroenterology

## 2024-01-12 ENCOUNTER — Other Ambulatory Visit: Payer: Self-pay

## 2024-01-13 ENCOUNTER — Other Ambulatory Visit (HOSPITAL_COMMUNITY): Payer: Self-pay

## 2024-01-13 MED ORDER — COVID-19 MRNA VAC-TRIS(PFIZER) 30 MCG/0.3ML IM SUSY
0.3000 mL | PREFILLED_SYRINGE | Freq: Once | INTRAMUSCULAR | 0 refills | Status: AC
  Filled 2024-01-13: qty 0.3, 1d supply, fill #0

## 2024-01-22 ENCOUNTER — Other Ambulatory Visit (HOSPITAL_COMMUNITY): Payer: Self-pay

## 2024-02-08 ENCOUNTER — Other Ambulatory Visit: Payer: Self-pay

## 2024-02-08 ENCOUNTER — Other Ambulatory Visit (HOSPITAL_COMMUNITY): Payer: Self-pay

## 2024-03-11 ENCOUNTER — Ambulatory Visit: Admitting: Neurology

## 2024-03-18 ENCOUNTER — Other Ambulatory Visit (HOSPITAL_COMMUNITY): Payer: Self-pay

## 2024-03-18 ENCOUNTER — Encounter (HOSPITAL_COMMUNITY): Payer: Self-pay

## 2024-03-23 ENCOUNTER — Other Ambulatory Visit (HOSPITAL_COMMUNITY): Payer: Self-pay

## 2024-03-23 ENCOUNTER — Ambulatory Visit: Admitting: Gastroenterology

## 2024-03-23 ENCOUNTER — Encounter: Payer: Self-pay | Admitting: Gastroenterology

## 2024-03-23 VITALS — BP 132/86 | HR 70 | Ht 71.0 in | Wt 190.7 lb

## 2024-03-23 DIAGNOSIS — Z79899 Other long term (current) drug therapy: Secondary | ICD-10-CM

## 2024-03-23 DIAGNOSIS — R1310 Dysphagia, unspecified: Secondary | ICD-10-CM

## 2024-03-23 DIAGNOSIS — K200 Eosinophilic esophagitis: Secondary | ICD-10-CM

## 2024-03-23 DIAGNOSIS — K580 Irritable bowel syndrome with diarrhea: Secondary | ICD-10-CM

## 2024-03-23 MED ORDER — COLESTIPOL HCL 1 G PO TABS
1.0000 g | ORAL_TABLET | Freq: Two times a day (BID) | ORAL | 1 refills | Status: AC
  Filled 2024-03-23: qty 60, 30d supply, fill #0

## 2024-03-23 MED ORDER — OMEPRAZOLE 20 MG PO CPDR
20.0000 mg | DELAYED_RELEASE_CAPSULE | Freq: Every day | ORAL | 3 refills | Status: AC
  Filled 2024-03-23: qty 90, 90d supply, fill #0

## 2024-03-23 NOTE — Patient Instructions (Signed)
 We have sent the following medications to your pharmacy for you to pick up at your convenience: Omeprazole  20 mg: Take once daily Colestid  1g: Take twice daily (please discuss the timing of when to take this medication in relation to your other medications)  Thank you for entrusting me with your care and for choosing [facility], Dr. Elspeth Naval    _______________________________________________________  If your blood pressure at your visit was 140/90 or greater, please contact your primary care physician to follow up on this.  _______________________________________________________  If you are age 22 or older, your body mass index should be between 23-30. Your Body mass index is 26.6 kg/m. If this is out of the aforementioned range listed, please consider follow up with your Primary Care Provider.  If you are age 55 or younger, your body mass index should be between 19-25. Your Body mass index is 26.6 kg/m. If this is out of the aformentioned range listed, please consider follow up with your Primary Care Provider.   ________________________________________________________  The [facility] providers would like to encourage you to use MYCHART to communicate with providers for non-urgent requests or questions.  Due to long hold times on the telephone, sending your provider a message by [facility] may be a faster and more efficient way to get a response.  Please allow 48 business hours for a response.  Please remember that this is for non-urgent requests.  _______________________________________________________  [facility] is using a team-based approach to care.  Your team is made up of your doctor and two to three APPS. Our APPS (Nurse Practitioners and Physician Assistants) work with your physician to ensure care continuity for you. They are fully qualified to address your health concerns and develop a treatment plan. They communicate directly with your  gastroenterologist to care for you. Seeing the Advanced Practice Practitioners on your physician's team can help you by facilitating care more promptly, often allowing for earlier appointments, access to diagnostic testing, procedures, and other specialty referrals.

## 2024-03-23 NOTE — Progress Notes (Signed)
 HPI : 53 year old male here for follow-up visit for EOE, GERD, IBS-D. Last seen June 2024.   EOE HISTORY: Recall he has a history of EOE with history of food impactions remotely.  Prior allergy testing has shown allergies to milk, tomatoes, squash, and almonds.  He has been  His last EGD was in 2017, no Barrett's.  IBS-D HISTORY: Recall he also has a history of IBS D that has been suspected.  He has had a prior colonoscopy in 2021 with no evidence of microscopic colitis.  He tested negative for celiac disease.  We have tried a few regimens over the years.  He did not like the low FODMAP diet too much.  There are some clear trigger foods that he thinks made his symptoms worse such as eating pork and he tries to avoid that.  He tried Viberzi  remotely but had some side effects and did not tolerate it.  SINCE LAST VISIT  Patient here for follow-up for his EOE and IBS.  At our last visit he was taking omeprazole  which was working pretty well to minimize his symptoms.  He has continued to avoid all known food allergens as best he can.  He ran out of omeprazole  somewhere along the line and has been off of it for some time.  He has been using Pepcid as needed.  He states he has had some recurrence of dysphagia while off the omeprazole .  He can often feel this with rice, carrots, turkey, especially if he eats quickly or late at night.  He states it can get stuck going down and he actually will spit up secretions for a bit before it passes.  No recurrent impactions.  Denies much reflux symptoms at baseline.  He had concerns about risks of chronic PPI and if he should take it or not, which we discussed fibrate.  Otherwise his bowels have been pretty stable since have last seen him.  He has usually 2-3 bowel movements in the morning and a few in the afternoon.  We had discussed using Imodium  on a routine basis but he states he has not really done that on a consistent basis.  He was on nortriptyline  for short  period of time for migraine headaches, but did not continue it.  He was never on Elavil.  We discussed some options to treat his symptoms otherwise that he may not have tried yet.  Discussed how much this bothers him and he does appear willing to try new medication for this.  He does have Levsin  to use as needed as well.     Prior evaluation:  EGD 01/2011 - distal esophageal stricture, dilated with 17mm Savory, increased Eos to 37 / HPF EGD 06/2015 - subtle trachealization and furrowing of the esophagus, without stenosis, with the remainder of the exam being normal. Biopsies show increased eosinophils of the esophagus    Colonoscopy 05/17/19 - The perianal and digital rectal examinations were normal. - The terminal ileum appeared normal. - A few small-mouthed diverticula were found in the sigmoid colon. - Two sessile polyps were found in the sigmoid colon. The polyps were 2 to 3 mm in size. These polyps were removed with a cold biopsy forceps. Resection and retrieval were complete. - Internal hemorrhoids were found during retroflexion. The hemorrhoids were small. - The exam was otherwise without abnormality. No inflammatory changes. - Biopsies for histology were taken with a cold forceps from the right colon, left colon and transverse colon for evaluation of microscopic colitis.  1. Surgical [P], transverse and right and left colon - COLONIC MUCOSA WITH NO SIGNIFICANT PATHOLOGIC FINDINGS. - NEGATIVE FOR ACTIVE INFLAMMATION AND OTHER ABNORMALITIES. 2. Surgical [P], colon, sigmoid, polyp (2) - TUBULAR ADENOMA, NEGATIVE FOR HIGH GRADE DYSPLASIA. - HYPERPLASTIC POLYP.   Repeat colonoscopy in 7 years     Past Medical History:  Diagnosis Date   Allergy    Anxiety    Depression    Dysphagia    Eosinophilic esophagitis    GERD (gastroesophageal reflux disease)    History of shingles    Irritable bowel syndrome      Past Surgical History:  Procedure Laterality Date   lasix surgery      UPPER GASTROINTESTINAL ENDOSCOPY     Family History  Problem Relation Age of Onset   Depression Father    Heart disease Father        some coronary issues   Aneurysm Father        kidney   Mental illness Father    Stroke Father    Heart attack Father    Colon cancer Paternal Grandmother        per pt colon vs stomach    Diabetes Mother        adult onset   Depression Mother    Cancer Brother    Drug abuse Brother    Early death Brother    Heart attack Sister    Stroke Sister    Healthy Sister    Diabetes Brother    Drug abuse Brother    Hypertension Brother    Colon polyps Neg Hx    Esophageal cancer Neg Hx    Rectal cancer Neg Hx    Stomach cancer Neg Hx    Social History[1] Current Outpatient Medications  Medication Sig Dispense Refill   cyanocobalamin  (VITAMIN B12) 1000 MCG/ML injection Inject 1 mL (1,000 mcg total) into the muscle once a week for 4 weeks, THEN 1 mL (1,000 mcg total) every month for 4 months. 8 mL 0   eletriptan  (RELPAX ) 40 MG tablet Take 1 tablet (40 mg total) by mouth as needed for migraine or headache. May repeat in 2 hours if headache persists or recurs. 10 tablet 5   famotidine (PEPCID) 20 MG tablet Take 20 mg by mouth daily as needed for heartburn or indigestion.      fexofenadine  (ALLEGRA ) 180 MG tablet Take 180 mg by mouth daily.      fluticasone  (FLONASE ) 50 MCG/ACT nasal spray Place 2 sprays into both nostrils daily. (Patient taking differently: Place 2 sprays into both nostrils as needed for allergies.) 16 g 0   meloxicam  (MOBIC ) 15 MG tablet Take 1 tablet (15 mg total) by mouth daily. (Patient taking differently: Take 15 mg by mouth as needed.) 90 tablet 0   nortriptyline  (PAMELOR ) 10 MG capsule Take 1 capsule (10 mg total) by mouth at bedtime for 30 days 30 capsule 3   ondansetron  (ZOFRAN -ODT) 4 MG disintegrating tablet Dissolve 1 tablet (4 mg total) by mouth every 8 (eight) hours as needed. 20 tablet 0   pyridOXINE  (VITAMIN B6) 25 MG  tablet Take 1 tablet (25 mg total) by mouth daily. 90 tablet 1   SUMAtriptan  (IMITREX ) 100 MG tablet Take 1 tablet (100 mg total) by mouth once as needed for Migraine for up to 1 dose. May take a second dose after 2 hours if needed. 9 tablet 6   SYRINGE-NEEDLE, DISP, 3 ML (B-D 3CC LUER-LOK SYR 25GX1) 25G X 1  3 ML MISC Use with B12 injections 8 each 0   No current facility-administered medications for this visit.   Allergies[2]   Review of Systems: All systems reviewed and negative except where noted in HPI.   Lab Results  Component Value Date   WBC 3.4 (L) 02/04/2023   HGB 14.2 02/04/2023   HCT 42.8 02/04/2023   MCV 94.0 02/04/2023   PLT 262.0 02/04/2023    Lab Results  Component Value Date   NA 140 02/04/2023   CL 102 02/04/2023   K 4.7 02/04/2023   CO2 29 02/04/2023   BUN 11 02/04/2023   CREATININE 1.01 02/04/2023   GFR 84.71 02/04/2023   CALCIUM 9.6 02/04/2023   ALBUMIN 4.2 02/04/2023   GLUCOSE 104 (H) 02/04/2023    Lab Results  Component Value Date   ALT 16 02/04/2023   AST 25 02/04/2023   ALKPHOS 55 02/04/2023   BILITOT 0.6 02/04/2023     Physical Exam: BP 132/86   Pulse 70   Ht 5' 11 (1.803 m)   Wt 190 lb 11.2 oz (86.5 kg)   BMI 26.60 kg/m  Constitutional: Pleasant,well-developed, male in no acute distress. Neurological: Alert and oriented to person place and time. Psychiatric: Normal mood and affect. Behavior is normal.   ASSESSMENT: 53 y.o. male here for assessment of the following  1. Eosinophilic esophagitis   2. Dysphagia, unspecified type   3. Long-term current use of proton pump inhibitor therapy   4. Irritable bowel syndrome with diarrhea    Longstanding EOE, known food allergens.  He is avoiding his food allergens pretty well as best he can.  He was on omeprazole  and doing really well for some time, ultimately stopped it several months ago and his symptoms have slowly recurred.  We discussed long-term treatment options.  Specifically  we discussed PPI use, long-term risks, we must weigh benefits versus risks.  I counseled him on the long-term risks of chronic PPI, answered his questions about this.  I think in his case the risks are generally low and the benefits outweigh the risks if he can control his symptoms.  He understands and would like to go back on omeprazole  20 mg daily and see how he does.  If he is on the medicine for a while but his symptoms persist or worsen, I do think he will need an EGD to assess for stricturing that may have occurred off PPI.  We discussed if he wants to pursue that now, he wants to hold off and see how he does on PPI, if no improvement in the upcoming months he should let me know.  Chew food well and eat slowly to reduce risk for impaction.  No history of Barrett's on prior EGD.  Was discussed his history of suspected IBS.  He has not tried using Imodium  on a routine basis, but discussed some other options.  He is now off all TCAs in regards to migraine history.  Offered him a trial of Colestid  1 g twice daily to see how he feels on this and if he likes that he can continue it.  Generally very safe, discussed risks, he wants to proceed.  He should speak with his pharmacist about any interaction in regards to absorption of other meds he is on.  He understands.  He will let me know if it does not work after a few week trial he should stop it and contact me for other options.  PLAN: - discussed options for EOE-  resume omeprazole  20mg  / day  - discussed long term risks / benefits of PPIs and alternatives, he wishes to take omeprazole  - discussed EGD.  Holding off for now, but if symptoms persist despite medical therapy, then EGD to rule out stricture / dilate - trial of colestid  1gm BID for 1 month - talk with pharmacy to ensure not interfering with absorption of other medications - f/u yearly if not sooner with issues   Marcey Naval, MD [facility]    [1]  Social History Tobacco  Use   Smoking status: Former    Types: Cigarettes   Smokeless tobacco: Never  Vaping Use   Vaping status: Never Used  Substance Use Topics   Alcohol use: Yes    Alcohol/week: 2.0 standard drinks of alcohol    Types: 1 Cans of beer, 1 Shots of liquor per week    Comment: 2 drinks daily - 2 beers max   Drug use: No  [2]  Allergies Allergen Reactions   Other Diarrhea and Itching    tomato   Almond Oil [employer] Agent (Non-Screening)]    Milk-Related Compounds

## 2024-03-28 ENCOUNTER — Ambulatory Visit: Admitting: Internal Medicine

## 2024-03-30 ENCOUNTER — Other Ambulatory Visit (HOSPITAL_COMMUNITY): Payer: Self-pay

## 2024-03-30 MED ORDER — NORTRIPTYLINE HCL 10 MG PO CAPS
10.0000 mg | ORAL_CAPSULE | Freq: Every evening | ORAL | 3 refills | Status: AC
  Filled 2024-03-30: qty 90, 90d supply, fill #0

## 2024-04-15 ENCOUNTER — Other Ambulatory Visit (HOSPITAL_COMMUNITY): Payer: Self-pay

## 2024-04-15 ENCOUNTER — Encounter

## 2024-04-15 ENCOUNTER — Telehealth: Admitting: Family Medicine

## 2024-04-15 DIAGNOSIS — B9689 Other specified bacterial agents as the cause of diseases classified elsewhere: Secondary | ICD-10-CM

## 2024-04-15 DIAGNOSIS — J208 Acute bronchitis due to other specified organisms: Secondary | ICD-10-CM | POA: Diagnosis not present

## 2024-04-15 MED ORDER — AZITHROMYCIN 250 MG PO TABS
ORAL_TABLET | ORAL | 0 refills | Status: AC
  Filled 2024-04-15: qty 6, 5d supply, fill #0

## 2024-04-15 MED ORDER — BENZONATATE 100 MG PO CAPS
100.0000 mg | ORAL_CAPSULE | Freq: Three times a day (TID) | ORAL | 0 refills | Status: AC | PRN
  Filled 2024-04-15: qty 30, 5d supply, fill #0

## 2024-04-15 NOTE — Progress Notes (Signed)
 We are sorry that you are not feeling well.  Here is how we plan to help!  Based on your presentation I believe you most likely have A cough due to bacteria.  When patients have a fever and a productive cough with a change in color or increased sputum production, we are concerned about bacterial bronchitis.  If left untreated it can progress to pneumonia.  If your symptoms do not improve with your treatment plan it is important that you contact your provider.   I have prescribed Azithromyin 250 mg: two tablets now and then one tablet daily for 4 additonal days    In addition you may use A prescription cough medication called Tessalon Perles 100mg . You may take 1-2 capsules every 8 hours as needed for your cough.  From your responses in the eVisit questionnaire you describe inflammation in the upper respiratory tract which is causing a significant cough.  This is commonly called Bronchitis and has four common causes:   Allergies Viral Infections Acid Reflux Bacterial Infection Allergies, viruses and acid reflux are treated by controlling symptoms or eliminating the cause. An example might be a cough caused by taking certain blood pressure medications. You stop the cough by changing the medication. Another example might be a cough caused by acid reflux. Controlling the reflux helps control the cough.     HOME CARE Only take medications as instructed by your medical team. Complete the entire course of an antibiotic. Drink plenty of fluids and get plenty of rest. Avoid close contacts especially the very young and the elderly Cover your mouth if you cough or cough into your sleeve. Always remember to wash your hands A steam or ultrasonic humidifier can help congestion.   GET HELP RIGHT AWAY IF: You develop worsening fever. You become short of breath You cough up blood. Your symptoms persist after you have completed your treatment plan MAKE SURE YOU  Understand these instructions. Will watch  your condition. Will get help right away if you are not doing well or get worse.  Your e-visit answers were reviewed by a board certified advanced clinical practitioner to complete your personal care plan.  Depending on the condition, your plan could have included both over the counter or prescription medications. If there is a problem please reply  once you have received a response from your provider. Your safety is important to us .  If you have drug allergies check your prescription carefully.    You can use MyChart to ask questions about today's visit, request a non-urgent call back, or ask for a work or school excuse for 24 hours related to this e-Visit. If it has been greater than 24 hours you will need to follow up with your provider, or enter a new e-Visit to address those concerns. You will get an e-mail in the next two days asking about your experience.  I hope that your e-visit has been valuable and will speed your recovery. Thank you for using e-visits.   I have spent 5 minutes in review of e-visit questionnaire, review and updating patient chart, medical decision making and response to patient.   Delon [name], PA-C

## 2024-05-17 ENCOUNTER — Ambulatory Visit: Payer: Self-pay | Admitting: Internal Medicine
